# Patient Record
Sex: Female | Born: 1980 | Race: Black or African American | Hispanic: No | Marital: Single | Smoking: Never smoker
Health system: Southern US, Community
[De-identification: ages and names within clinical notes are randomized; demographics above are authoritative.]

## PROBLEM LIST (undated history)

## (undated) ENCOUNTER — Inpatient Hospital Stay (HOSPITAL_COMMUNITY): Payer: Self-pay

## (undated) ENCOUNTER — Ambulatory Visit (HOSPITAL_COMMUNITY): Admission: EM | Payer: 59 | Source: Home / Self Care

## (undated) DIAGNOSIS — N390 Urinary tract infection, site not specified: Secondary | ICD-10-CM

## (undated) DIAGNOSIS — R011 Cardiac murmur, unspecified: Secondary | ICD-10-CM

## (undated) DIAGNOSIS — R51 Headache: Secondary | ICD-10-CM

## (undated) DIAGNOSIS — I499 Cardiac arrhythmia, unspecified: Secondary | ICD-10-CM

---
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
## 2003-03-17 ENCOUNTER — Emergency Department (HOSPITAL_COMMUNITY): Admission: EM | Admit: 2003-03-17 | Discharge: 2003-03-17 | Payer: Self-pay | Admitting: Emergency Medicine

## 2003-03-17 ENCOUNTER — Encounter: Payer: Self-pay | Admitting: Emergency Medicine

## 2003-04-17 ENCOUNTER — Inpatient Hospital Stay (HOSPITAL_COMMUNITY): Admission: AD | Admit: 2003-04-17 | Discharge: 2003-04-17 | Payer: Self-pay | Admitting: *Deleted

## 2003-05-23 ENCOUNTER — Other Ambulatory Visit: Admission: RE | Admit: 2003-05-23 | Discharge: 2003-05-23 | Payer: Self-pay | Admitting: Obstetrics and Gynecology

## 2003-08-30 ENCOUNTER — Inpatient Hospital Stay (HOSPITAL_COMMUNITY): Admission: AD | Admit: 2003-08-30 | Discharge: 2003-08-31 | Payer: Self-pay | Admitting: Obstetrics and Gynecology

## 2003-09-03 ENCOUNTER — Inpatient Hospital Stay (HOSPITAL_COMMUNITY): Admission: AD | Admit: 2003-09-03 | Discharge: 2003-09-03 | Payer: Self-pay | Admitting: Otolaryngology

## 2003-09-05 ENCOUNTER — Encounter: Payer: Self-pay | Admitting: Obstetrics and Gynecology

## 2003-09-05 ENCOUNTER — Inpatient Hospital Stay (HOSPITAL_COMMUNITY): Admission: AD | Admit: 2003-09-05 | Discharge: 2003-09-05 | Payer: Self-pay | Admitting: Obstetrics and Gynecology

## 2003-09-06 ENCOUNTER — Inpatient Hospital Stay (HOSPITAL_COMMUNITY): Admission: AD | Admit: 2003-09-06 | Discharge: 2003-09-10 | Payer: Self-pay | Admitting: Obstetrics and Gynecology

## 2003-09-06 ENCOUNTER — Encounter (INDEPENDENT_AMBULATORY_CARE_PROVIDER_SITE_OTHER): Payer: Self-pay | Admitting: Specialist

## 2003-09-06 ENCOUNTER — Encounter: Payer: Self-pay | Admitting: Emergency Medicine

## 2003-09-13 ENCOUNTER — Inpatient Hospital Stay (HOSPITAL_COMMUNITY): Admission: AD | Admit: 2003-09-13 | Discharge: 2003-09-30 | Payer: Self-pay | Admitting: Obstetrics and Gynecology

## 2003-09-14 ENCOUNTER — Encounter: Payer: Self-pay | Admitting: Obstetrics and Gynecology

## 2003-09-15 ENCOUNTER — Encounter: Payer: Self-pay | Admitting: Obstetrics and Gynecology

## 2003-09-19 ENCOUNTER — Encounter: Payer: Self-pay | Admitting: Surgery

## 2003-10-11 ENCOUNTER — Inpatient Hospital Stay (HOSPITAL_COMMUNITY): Admission: AD | Admit: 2003-10-11 | Discharge: 2003-10-11 | Payer: Self-pay | Admitting: *Deleted

## 2004-01-04 ENCOUNTER — Emergency Department (HOSPITAL_COMMUNITY): Admission: EM | Admit: 2004-01-04 | Discharge: 2004-01-04 | Payer: Self-pay | Admitting: Emergency Medicine

## 2005-05-06 ENCOUNTER — Emergency Department (HOSPITAL_COMMUNITY): Admission: EM | Admit: 2005-05-06 | Discharge: 2005-05-06 | Payer: Self-pay | Admitting: Emergency Medicine

## 2005-08-16 ENCOUNTER — Emergency Department (HOSPITAL_COMMUNITY): Admission: EM | Admit: 2005-08-16 | Discharge: 2005-08-16 | Payer: Self-pay | Admitting: Emergency Medicine

## 2005-10-31 ENCOUNTER — Emergency Department (HOSPITAL_COMMUNITY): Admission: EM | Admit: 2005-10-31 | Discharge: 2005-10-31 | Payer: Self-pay | Admitting: Emergency Medicine

## 2005-11-06 ENCOUNTER — Ambulatory Visit: Payer: Self-pay | Admitting: Cardiology

## 2005-11-12 ENCOUNTER — Ambulatory Visit: Payer: Self-pay | Admitting: Cardiology

## 2005-12-05 ENCOUNTER — Ambulatory Visit: Payer: Self-pay | Admitting: Cardiology

## 2006-01-16 ENCOUNTER — Ambulatory Visit: Payer: Self-pay | Admitting: Cardiology

## 2006-03-01 ENCOUNTER — Emergency Department (HOSPITAL_COMMUNITY): Admission: EM | Admit: 2006-03-01 | Discharge: 2006-03-01 | Payer: Self-pay | Admitting: Emergency Medicine

## 2006-04-17 ENCOUNTER — Emergency Department (HOSPITAL_COMMUNITY): Admission: EM | Admit: 2006-04-17 | Discharge: 2006-04-17 | Payer: Self-pay | Admitting: Emergency Medicine

## 2006-07-21 ENCOUNTER — Ambulatory Visit: Payer: Self-pay | Admitting: Internal Medicine

## 2006-10-10 ENCOUNTER — Ambulatory Visit: Payer: Self-pay | Admitting: Cardiology

## 2006-10-23 ENCOUNTER — Encounter: Payer: Self-pay | Admitting: Cardiology

## 2006-10-23 ENCOUNTER — Ambulatory Visit: Payer: Self-pay

## 2006-10-27 ENCOUNTER — Ambulatory Visit: Payer: Self-pay | Admitting: Cardiology

## 2006-10-30 ENCOUNTER — Emergency Department (HOSPITAL_COMMUNITY): Admission: EM | Admit: 2006-10-30 | Discharge: 2006-10-31 | Payer: Self-pay | Admitting: Emergency Medicine

## 2007-03-12 ENCOUNTER — Emergency Department (HOSPITAL_COMMUNITY): Admission: EM | Admit: 2007-03-12 | Discharge: 2007-03-12 | Payer: Self-pay | Admitting: Emergency Medicine

## 2007-04-26 ENCOUNTER — Inpatient Hospital Stay (HOSPITAL_COMMUNITY): Admission: AD | Admit: 2007-04-26 | Discharge: 2007-04-26 | Payer: Self-pay | Admitting: Obstetrics

## 2007-06-15 ENCOUNTER — Ambulatory Visit (HOSPITAL_COMMUNITY): Admission: RE | Admit: 2007-06-15 | Discharge: 2007-06-15 | Payer: Self-pay | Admitting: Obstetrics

## 2007-07-20 ENCOUNTER — Ambulatory Visit (HOSPITAL_COMMUNITY): Admission: RE | Admit: 2007-07-20 | Discharge: 2007-07-20 | Payer: Self-pay | Admitting: Obstetrics

## 2007-09-15 ENCOUNTER — Inpatient Hospital Stay (HOSPITAL_COMMUNITY): Admission: AD | Admit: 2007-09-15 | Discharge: 2007-09-15 | Payer: Self-pay | Admitting: Obstetrics & Gynecology

## 2007-09-28 ENCOUNTER — Inpatient Hospital Stay (HOSPITAL_COMMUNITY): Admission: AD | Admit: 2007-09-28 | Discharge: 2007-09-28 | Payer: Self-pay | Admitting: Obstetrics & Gynecology

## 2007-10-03 ENCOUNTER — Inpatient Hospital Stay (HOSPITAL_COMMUNITY): Admission: AD | Admit: 2007-10-03 | Discharge: 2007-10-03 | Payer: Self-pay | Admitting: Obstetrics & Gynecology

## 2007-11-08 ENCOUNTER — Inpatient Hospital Stay (HOSPITAL_COMMUNITY): Admission: AD | Admit: 2007-11-08 | Discharge: 2007-11-08 | Payer: Self-pay | Admitting: Obstetrics

## 2007-11-08 ENCOUNTER — Inpatient Hospital Stay (HOSPITAL_COMMUNITY): Admission: AD | Admit: 2007-11-08 | Discharge: 2007-11-11 | Payer: Self-pay | Admitting: Obstetrics

## 2007-11-09 ENCOUNTER — Encounter: Payer: Self-pay | Admitting: Obstetrics

## 2008-04-14 ENCOUNTER — Emergency Department (HOSPITAL_COMMUNITY): Admission: EM | Admit: 2008-04-14 | Discharge: 2008-04-14 | Payer: Self-pay | Admitting: Emergency Medicine

## 2008-05-14 ENCOUNTER — Emergency Department (HOSPITAL_COMMUNITY): Admission: EM | Admit: 2008-05-14 | Discharge: 2008-05-14 | Payer: Self-pay | Admitting: Emergency Medicine

## 2008-07-29 ENCOUNTER — Emergency Department (HOSPITAL_COMMUNITY): Admission: EM | Admit: 2008-07-29 | Discharge: 2008-07-29 | Payer: Self-pay | Admitting: Emergency Medicine

## 2009-06-08 ENCOUNTER — Emergency Department (HOSPITAL_COMMUNITY): Admission: EM | Admit: 2009-06-08 | Discharge: 2009-06-08 | Payer: Self-pay | Admitting: Emergency Medicine

## 2010-03-10 ENCOUNTER — Emergency Department (HOSPITAL_COMMUNITY): Admission: EM | Admit: 2010-03-10 | Discharge: 2010-03-10 | Payer: Self-pay | Admitting: Emergency Medicine

## 2010-12-16 ENCOUNTER — Encounter: Payer: Self-pay | Admitting: Obstetrics

## 2011-01-11 ENCOUNTER — Inpatient Hospital Stay (INDEPENDENT_AMBULATORY_CARE_PROVIDER_SITE_OTHER)
Admission: RE | Admit: 2011-01-11 | Discharge: 2011-01-11 | Disposition: A | Discharge status: Home / Self Care | Payer: Self-pay | Source: Ambulatory Visit | Attending: Family Medicine | Admitting: Family Medicine

## 2011-01-11 DIAGNOSIS — K5289 Other specified noninfective gastroenteritis and colitis: Secondary | ICD-10-CM

## 2011-01-11 DIAGNOSIS — R110 Nausea: Secondary | ICD-10-CM

## 2011-02-12 LAB — CBC
HCT: 41.1 % (ref 36.0–46.0)
Platelets: 249 10*3/uL (ref 150–400)
RDW: 13.4 % (ref 11.5–15.5)

## 2011-02-12 LAB — RAPID URINE DRUG SCREEN, HOSP PERFORMED
Benzodiazepines: NOT DETECTED
Cocaine: NOT DETECTED
Tetrahydrocannabinol: NOT DETECTED

## 2011-02-12 LAB — COMPREHENSIVE METABOLIC PANEL
AST: 29 U/L (ref 0–37)
Albumin: 4.4 g/dL (ref 3.5–5.2)
Alkaline Phosphatase: 39 U/L (ref 39–117)
BUN: 7 mg/dL (ref 6–23)
GFR calc Af Amer: >60 mL/min (ref >60)
Potassium: 2.9 mEq/L — ABNORMAL LOW (ref 3.5–5.1)
Total Protein: 7.8 g/dL (ref 6.0–8.3)

## 2011-02-12 LAB — DIFFERENTIAL
Lymphocytes Relative: 11 % — ABNORMAL LOW (ref 12–46)
Monocytes Absolute: 0.9 10*3/uL (ref 0.1–1.0)
Monocytes Relative: 7 % (ref 3–12)
Neutro Abs: 10.8 10*3/uL — ABNORMAL HIGH (ref 1.7–7.7)

## 2011-02-12 LAB — ETHANOL: Alcohol, Ethyl (B): <5 mg/dL (ref 0–10)

## 2011-03-10 ENCOUNTER — Inpatient Hospital Stay (HOSPITAL_COMMUNITY)
Admission: AD | Admit: 2011-03-10 | Discharge: 2011-03-11 | Disposition: A | Discharge status: Home / Self Care | Payer: Medicaid Other | Source: Ambulatory Visit | Attending: Obstetrics & Gynecology | Admitting: Obstetrics & Gynecology

## 2011-03-10 DIAGNOSIS — R071 Chest pain on breathing: Secondary | ICD-10-CM | POA: Insufficient documentation

## 2011-03-10 DIAGNOSIS — B9789 Other viral agents as the cause of diseases classified elsewhere: Secondary | ICD-10-CM

## 2011-03-10 DIAGNOSIS — R509 Fever, unspecified: Secondary | ICD-10-CM

## 2011-03-10 LAB — CBC
Hemoglobin: 13.7 g/dL (ref 12.0–15.0)
MCH: 28.8 pg (ref 26.0–34.0)
MCV: 86.1 fL (ref 78.0–100.0)
RBC: 4.75 MIL/uL (ref 3.87–5.11)

## 2011-03-10 LAB — URINALYSIS, ROUTINE W REFLEX MICROSCOPIC
Ketones, ur: NEGATIVE mg/dL
Nitrite: NEGATIVE
Protein, ur: 30 mg/dL — AB
Urobilinogen, UA: 1 mg/dL (ref 0.0–1.0)

## 2011-03-10 LAB — COMPREHENSIVE METABOLIC PANEL
AST: 19 U/L (ref 0–37)
BUN: 4 mg/dL — ABNORMAL LOW (ref 6–23)
CO2: 25 mEq/L (ref 19–32)
Chloride: 105 mEq/L (ref 96–112)
Creatinine, Ser: 0.75 mg/dL (ref 0.4–1.2)
GFR calc Af Amer: >60 mL/min (ref >60)
GFR calc non Af Amer: >60 mL/min (ref >60)
Total Bilirubin: 0.7 mg/dL (ref 0.3–1.2)

## 2011-03-10 LAB — DIFFERENTIAL
Basophils Absolute: 0 10*3/uL (ref 0.0–0.1)
Basophils Relative: 0 % (ref 0–1)
Eosinophils Relative: 0 % (ref 0–5)
Lymphocytes Relative: 14 % (ref 12–46)

## 2011-03-10 LAB — RAPID STREP SCREEN (MED CTR MEBANE ONLY): Streptococcus, Group A Screen (Direct): NEGATIVE

## 2011-04-12 NOTE — Op Note (Signed)
Ann Taylor, Ann Taylor                       ACCOUNT NO.:  [id number]   MEDICAL RECORD NO.:  [id number]                   PATIENT TYPE:  INP   LOCATION:  0160                                 FACILITY:  [facility]   PHYSICIAN:  Tanya S. Shawnie Pons, M.D.                DATE OF BIRTH:  December 26, 1980   DATE OF PROCEDURE:  09/23/2003  DATE OF DISCHARGE:                                 OPERATIVE REPORT   PREOPERATIVE DIAGNOSIS:  Pelvic abscess.   POSTOPERATIVE DIAGNOSIS:  Pelvic abscess.   PROCEDURE:  Exploratory laparotomy with drainage of abscess, lysis of  adhesions.   SURGEON:  Lorne Skeens. Hoxworth, M.D. and Shelbie Proctor. Shawnie Pons, M.D.   ANESTHESIA:  General.   FINDINGS:  Pelvic abscess, very edematous broad ligament and tube on the  right side, normal appearing ovary, multiple adhesions of the uterus and  bowel to the pelvic organs and anterior abdominal wall.   ESTIMATED BLOOD LOSS:  200 mL.   COMPLICATIONS:  None.   SPECIMENS:  Cultures to pathology.   INDICATIONS FOR PROCEDURE:  The patient is a 30 year old, G1, P1 who is  postop from a cesarean section approximately 18 days ago who had been  readmitted with a pelvic abscess. She was on IV antibiotics for many days  with continued high spiking temperatures and failure of resolution of her  symptoms. She had several CT's that all showed a fluid collection of about 7  cm in the right pelvis and since it did not respond to IV antibiotics, she  was taken to surgery.   DESCRIPTION OF PROCEDURE:  The patient was taken to the OR where was prepped  and draped in the usual sterile fashion. A Pfannenstiel incision was made  through the previous scar with a knife, carried down to the underlying  fascia which was divided with the electrocautery. The rectus muscle was then  divided in the midline and the peritoneal cavity entered. Upon entry,  omentum was noted to be stuck to the superior most portion of the uterus as  well as the area where the  previous incision was on the uterus was attached  to the abdominal wall as well. In the right pelvis, a mass was felt and with  manipulation pus was noted, cultures were taken, more pus noted. Following  this, the cecum, appendix and bowel were looked at and felt to be free from  the areas of induration. An 18 gauge needle and syringe were used to try to  aspirate any pus from the remaining pockets and none was found. After  removal of omentum from the tube on that side, it was felt that the round  could be positively identified on the right, the ovary was seen posteriorly  and the tube and broad ligament were exceptionally edematous and attached to  the right pelvic sidewall. There were several exceptionally indurated areas  which were incised with the electrocautery but  no pus was found in these  areas. However, the tube was entered and looked to be normal in appearance.  This area was then copiously irrigated, a drain placed in the area and the  rectus muscle which had been previously incised with the electrocautery on  the right was put back together with a 0 PDS in a running fashion. The two  sides of the  rectus muscle were then put back together with #1 PDS. The two sides of the  fascia were closed from either end with a 0 PDS and tied in the midline. The  subcutaneous tissue was then packed with 4 x 4's. All instrument, needle and  lap counts were correct x2. The patient was awakened and taken to recovery  in stable condition.                                               Shelbie Proctor. Shawnie Pons, M.D.    TSP/MEDQ  D:  09/23/2003  T:  09/23/2003  Job:  [id number]

## 2011-04-12 NOTE — H&P (Signed)
NAME:  Ann Taylor, Ann Taylor                       ACCOUNT NO.:  [id number]   MEDICAL RECORD NO.:  [id number]                   PATIENT TYPE:  INP   LOCATION:  9327                                 FACILITY:  [facility]   PHYSICIAN:  James A. Ashley Royalty, M.D.             DATE OF BIRTH:  Apr 23, 1981   DATE OF ADMISSION:  09/13/2003  DATE OF DISCHARGE:                                HISTORY & PHYSICAL   HISTORY OF PRESENT ILLNESS:  This is a 30 year old gravida 1, para 1, who  had a C-section September 06, 2003, for late decelerations in labor.  At the  time of the cesarean section she was noted to have meconium-stained amniotic  fluid and also foul-smelling amniotic fluid, suggestive of chorioamnionitis.  The intraoperative course was unremarkable.  Postoperatively the patient was  treated therapeutically with IV antibiotics and rapidly became afebrile.  Her baby initially went to the central nursery but was later sent to the  neonatal intensive care unit.  The patient was allowed to stay in the  hospital for four days and was discharged home on the fourth postoperative  day, afebrile, and in satisfactory condition.   The patient presented to the office this morning after she was documented at  her house having a temperature as high as 100.8 degrees Fahrenheit.  She  also complained of some serous drainage from her incision.  She stated she  had some rather modest discomfort on the right side of her abdomen and  radiating somewhat to her right leg and right buttocks.  She denied any  other GI, GU, or GYN symptoms.   MEDICATIONS:  Percocet for incisional discomfort.   PAST MEDICAL HISTORY:  The patient alleged during her prenatal care that she  was positive for sickle trait.  However, a hemoglobin electrophoresis  documented she had adult hemoglobin.  She was group B Strep positive.   PAST SURGICAL HISTORY:  C-section only as above.   ALLERGIES:  None.   FAMILY HISTORY:  Positive for  hypertension and diabetes.   SOCIAL HISTORY:  The patient denies use of tobacco or alcohol.   REVIEW OF SYSTEMS:  Noncontributory.   PHYSICAL EXAMINATION:  GENERAL:  Well-developed, well-nourished, pleasant  black female in no acute distress.  VITAL SIGNS:  Temperature 100.4 degrees Fahrenheit, vital signs stable.  SKIN:  Warm and dry without lesions.  LYMPHS:  There is no supraclavicular, cervical, or inguinal adenopathy.  HEENT:  Normocephalic.  NECK:  Supple without thyromegaly.  CHEST:  Lungs are clear.  CARDIAC:  Regular rate and rhythm without murmurs, gallops, or rubs.  BREASTS:  Reveal no palpable masses, discharge, retraction, erythema, or  adenopathy.  ABDOMEN:  Soft and nontender with the fundus being at approximately two  finger breadths below the umbilicus.  I could appreciate no significant  abdominal tenderness and certainly no rebound.  Bowel sounds are active.  Careful inspection of the incision revealed a small  amount of serous  drainage eminating from an area just to the left of midline as well as near  the extreme left aspect of the incision.  I could appreciate no purulent  drainage whatsoever.  There was some induration noted.  I could appreciate  no erythema.  MUSCULOSKELETAL:  Revealed full range of motion without edema, cyanosis, or  CVA tenderness.  PELVIC:  Revealed no obvious pelvic mass on bimanual.  There was appropriate  incisional tenderness.   IMPRESSION:  1. Status post C-section one week ago for late decelerations.  2. Probable chorioamnionitis - resolved with IV antibiotics.  3. Fever and serous incisional drainage with induration - possible early     wound infection.  Differential includes urinary tract infection,     endomyometritis.  Doubt sepsis.   PLAN:  Admit to [facility].  Will obtain labs, cultures including  wound, urine, blood.  Will further probe incision after admission and  initiate IV antibiotics.                                                   James A. Ashley Royalty, M.D.    JAM/MEDQ  D:  09/13/2003  T:  09/13/2003  Job:  [id number]

## 2011-04-12 NOTE — Discharge Summary (Signed)
NAME:  Ann, Taylor                       ACCOUNT NO.:  [id number]   MEDICAL RECORD NO.:  [id number]                   PATIENT TYPE:  INP   LOCATION:  0471                                 FACILITY:  [facility]   PHYSICIAN:  Sharlet Salina T. Hoxworth, M.D.          DATE OF BIRTH:  03-28-1981   DATE OF ADMISSION:  09/13/2003  DATE OF DISCHARGE:  09/30/2003                                 DISCHARGE SUMMARY   DISCHARGE DIAGNOSIS:  Pelvic abscess, status post cesarean section.   OPERATIONS AND PROCEDURES:  1. Percutaneous aspiration of percutaneous abscess, CT guided on September 15, 2003.  2. Laparotomy with drainage of pelvic abscess by Dr. Johna Sheriff and Dr. Shawnie Pons     on September 23, 2003.   CONSULTATIONS:  Dr. Mancel Bale.   HISTORY OF PRESENT ILLNESS:  This patient is a 30 year old gravida 1, para  1, black female status post cesarean section on September 06, 2003, by Dr.  Ashley Royalty for late decelerations in labor.  At that time, she had findings  suggestive of chorioamnionitis.  The patient was treated postoperatively  with IV antibiotics, rapidly became afebrile.  She was discharged after the  fourth postoperative day, afebrile, in good condition.  The patient  presented on the morning of admission with fever as high as 100.8 and slight  drainage from her incision, as well as discomfort in the right side of the  abdomen radiating to her back.  She was admitted for further evaluation and  treatment by Dr. Ashley Royalty at [facility].   MEDICATIONS ON ADMISSION:  Percocet for pain.   PAST MEDICAL HISTORY:  Unremarkable except as above.   ALLERGIES:  No known drug allergies.   FAMILY HISTORY:  See detailed H&P.   REVIEW OF SYSTEMS:  See detailed H&P.   PHYSICAL EXAMINATION:  VITAL SIGNS:  Temperature is 100.4, vital signs all  within normal limits.  ABDOMEN:  Revealed an appropriately enlarged fundus, small amount of serous  drainage from the Pfannenstiel incision just left of  the midline, and no  significant abdominal tenderness noted.   HOSPITAL COURSE:  The patient was admitted to [facility] by Dr.  Ashley Royalty and was started on broad spectrum antibiotics.  Soon after  admission, her temperature was increased to 102.8.  Laboratory evaluations  revealed a white count of 19,700, with a hemoglobin of 9.3, urinalysis was  clear.  Due to fever and white count, a CT scan of the abdomen and pelvis  was obtained which revealed a complex apparent abscess in the right pelvis.  This was consistent with an infected pelvic hematoma.  Her wound was opened  slightly by Dr. Ashley Royalty with just serous drainage and no purulence.  This  was treated with dry gauze dressing changes.  She was continued on IV  antibiotics.  She was seen in consultation by infectious disease service.  She had been on Unasyn and her antibiotics were changed  to Zosyn up to  vancomycin.  She underwent aspiration of the area in radiology.  This was  felt to be multiloculated and a small amount of fluid was obtained, but a  catheter was not placed due to the multi-loculations.  She continued to have  fever of about 102 on September 15, 2003, and white count was still elevated  at 18,600.  The patient was seen in consultation by general surgery,  initially Dr. Jamey Ripa, who recommended continued antibiotics, close  observation, and re-CT scan to assess for progression or resolution and  possible percutaneous drainage.  By September 17, 2003, her temperature was  down to 99, and she had somewhat less pain and tenderness.  She was  continued on antibiotics.  Abscess culture revealed Strep viridans.  This  was sensitive to Zosyn, and her vancomycin discontinued by infectious  disease.  On September 19, 2003, her white count was down to 11,000.  She  still had some intermittent fever, but her temperature curve was steadily  down.  By September 20, 2003, she had a temperature max of 99, normal white  count.  Her  wounds healed without any evidence of further infection.  She  continued slow improvement, and plan was made to repeat her CT scan in  several days.  She did have one fever spike to 104 degrees on September 22, 2003, and rapidly back to normal, with still a normal white count of 7.9.  She requested transfer to the GYN Teaching Service at [facility] which  was accomplished.  However, on September 22, 2003, she again had a fever and  her CT scan was obtained at this point which showed a persistence of a  multiloculated apparent abscess in the right pelvis.  At this point, she was  also noted to be somewhat tachycardic with pulse of about 115, and blood  pressure down to 97/50.  She was given bolused IV fluids.  Temp later that  day was increased to 105, with a pulse of 132, and she was transferred to  the intensive care unit at [facility].  Due to some deterioration  and inability to drain the abscess percutaneously, a laparotomy and drainage  was recommended and accepted.  She was continued on Zosyn.  Laboratory at  this time also revealed a moderately elevated PTT of 55 seconds, with a PT  of 15.9, and INR of 1.4.  Preoperatively, she received 2 units of fresh  frozen plasma and was taken to the operating room on September 23, 2003.  Findings were a discrete abscess adjacent to the right tube, and uterus and  cecum, but not clearly emanating from any of these structures.  She  underwent drainage of her abscess and a thorough exploration.  Wound was  left packed open.  She continued on antibiotics postoperatively which were  changed to Cipro plus clindamycin due to complaints of severe itching after  her most recent dose of Zosyn.  She was febrile to 103 on the first  postoperative day, down to 100 on the second postoperative day.  She had a  fair amount of incisional pain treated with PCA morphine.  White count was 7.8 on September 25, 2003.  She was begun on a full liquid diet.   She still  had some intermittent fever for the next several days, but this trended down  and was afebrile by September 27, 2003, and remained so.  She was transferred  to the floor.  No  definite diagnosis was found for elevated PTT, but it was  felt it may be some combination of antibiotics and infection.  Lupus  anticoagulant panel was negative.  She also had Lovenox and this was  discontinued perioperatively.  She continued to steadily improve.  Advanced  to a regular diet.  Activity level increased.  IV antibiotics were  discontinued.  She was changed to p.o. Cipro plus clindamycin to be  continued for nine days after discharge.  She was discharged home on  September 30, 2003.  She has minimal abdominal discomfort.  Wound has nearly  healed with a small area of granulation tissue.  Abdomen is nontender.  She  was also given Vicodin p.r.n. for pain.  Follow up with me in my office in  two weeks.                                               Lorne Skeens. Hoxworth, M.D.    Tory Emerald  D:  10/10/2003  T:  10/10/2003  Job:  [id number]   cc:   Jillyn Hidden B. Truett Perna, M.D.  [address] [facility]  [place]  [place] [address]  Fax: [phone]   Shelbie Proctor. Shawnie Pons, M.D.  Fax: [phone]   Rudy Jew. Ashley Royalty, M.D.  [address]  [place], [place] [address]  Fax: [phone]   Rockey Situ. Flavia Shipper., M.D.  [address]  [place]  [place] [address]  Fax: [phone]

## 2011-04-12 NOTE — H&P (Signed)
   NAME:  Ann Taylor, Ann Taylor                       ACCOUNT NO.:  [id number]   MEDICAL RECORD NO.:  [id number]                   PATIENT TYPE:  INP   LOCATION:  9327                                 FACILITY:  [facility]   PHYSICIAN:  James A. Ashley Royalty, M.D.             DATE OF BIRTH:  04-Jan-1981   DATE OF ADMISSION:  09/13/2003  DATE OF DISCHARGE:                                HISTORY & PHYSICAL   HISTORY OF PRESENT ILLNESS:  The patient is a 30 year old, gravida 1, para  1, seven days post primary low-transverse cesarean section for late  decelerations in labor.  At the time of the C-section, the patient was noted  to also have meconium staining amniotic fluid and foul-smelling fluid  consistent with chorioamnionitis.  Her initial postoperative course at  [facility] was complicated by fever.  She was treated therapeutically  without the antibiotics and defervesced rapidly.  Her infant started out in  the central nursery and then later was moved to the Neonatal Intensive Care  Unit.  She was discharged on the fourth postoperative day. Afebrile and in  satisfactory condition.  At the time of the discharge, her incision was dry,  intact, nonerythematous, and nonindurated.   The patient returned to the office today saying she was having some leakage  from her incision.  It seemed to be serous in nature.  She had been  documented as having a temperature of 100.8 degrees Fahrenheit at her house.  She stated she had some mild abdominal discomfort over the operative area,  particularly on the right side, radiating slightly to her leg and also to  her right hip.  She stated her lochia was minimal and she denied any other  specific symptoms.   DISCHARGE MEDICATIONS:  Percocet.   PAST MEDICAL HISTORY:  Genetic:  The patient alleged she was positive for  sickle trait during pregnancy but a hemoglobin electrophoresis confirmed she  did not have sickle trait or sickle cell.  She did register group  B  Streptococcus positive.   Dictation was cut off at this point.                                               James A. Ashley Royalty, M.D.    JAM/MEDQ  D:  09/13/2003  T:  09/13/2003  Job:  [id number]

## 2011-04-12 NOTE — Op Note (Signed)
NAME:  Ann Taylor                       ACCOUNT NO.:  [id number]   MEDICAL RECORD NO.:  [id number]                   PATIENT TYPE:  INP   LOCATION:  0160                                 FACILITY:  [facility]   PHYSICIAN:  Sharlet Salina T. Hoxworth, M.D.          DATE OF BIRTH:  Jan 01, 1981   DATE OF PROCEDURE:  09/23/2003  DATE OF DISCHARGE:                                 OPERATIVE REPORT   PREOPERATIVE DIAGNOSIS:  Pelvic abscess.   POSTOPERATIVE DIAGNOSIS:  Pelvic abscess.   OPERATION/PROCEDURE:  Laparotomy and drainage of pelvic abscess.   SURGEON:  Lorne Skeens. Hoxworth, M.D.  Shelbie Proctor. Shawnie Pons, M.D.   ANESTHESIA:  General.   BRIEF HISTORY:  Ann Taylor is a 30 year old white female just over two  weeks following cesarean section.  She developed fever and right lower  quadrant pain postoperatively.  On CT scan has had a complex fluid  collection consistent with abscess in the right lower quadrant just below  the cecum and lateral to the uterus.  Initially this was treated with IV  antibiotics with initial improvement but she has developed recurrent spiking  fevers the past 48 hours.  Repeat CT scan shows persistent collection.  This  is not felt to be drainable with CT-guided catheter due to multiloculation.  For this reason, a laparotomy and drainage of the abscess has been  recommended.  The procedure was discussed extensively with the patient and  her mother preoperatively including the indications, alternatives, risks of  bleeding, infection, intestinal injury, recurrent abscess, and slight  possibility for requiring salpingo-oophorectomy and hysterectomy.  The  procedure is to be done in conjunction with Dr. Shawnie Pons from the OB/GYN  service.   DESCRIPTION OF PROCEDURE:  The patient was brought to the operating room and  placed in the supine position on the operating table and general  endotracheal anesthesia was induced.  She was already on broad-spectrum  antibiotics.   The abdomen was widely sterilely prepped and draped.  Foley  catheter was in place.  PS were in place.   The previous Pfannenstiel incision was used and this was sharply opened and  dissection carried down with cautery to the anterior fascia which was  incised transversely.  The incision was extended 1 cm to out toward the  right upper quadrant.  Dissection was carried down to the rectus muscle.  There was a lot of inflammation in the wound but no obvious pus.  The  anterior fascia was dissected up off the rectus muscle superiorly for 4-5  cm.  The rectus muscle was then divided in the midline as had been done  previously and using careful blunt dissection under direct vision, the  peritoneal cavity entered just above the uterus.  The uterus was obviously  still enlarged and adherent up to the anterior abdominal wall in the area of  the incision.  The free peritoneal cavity was entered above this and there  was  no free fluid or abnormalities here with normal-appearing free bowel.  Using careful blunt dissection, the uterus was dissected off the anterior  abdominal wall towards the right pelvic sidewall.  As the dissection  progressed laterally, a pocket of purulent material was entered.  This was  cultured and completely drained. This pocket lay just below the cecum and  just lateral to the right tube and ovary and the uterus.  Following this  drainage, area was much more extensively explored.  The terminal ileum,  cecum and appendix were identified and appeared to be above the process.  There was quite a lot of inflammatory change and everything was quite stuck  here.  Using careful blunt dissection, the tube and ovary were dissected  free.  Dr. Shawnie Pons carefully inspected the tube, ovary and uterus and this is  to be dictated separately.  There was one area just lateral to the tube that  appeared to be part of the broad ligament that was felt possibly to contain  fluid and pus and this was  aspirated and we actually got the GI content here  and after further dissection, this was an area of right colon just above the  cecum but adherent down to the pelvic sidewall.  This has been aspirated  with a 10-gauge needle and there was no leakage at all from this area.  After aspiration, I did, however, reinforce this with one figure-of-eight  suture and 3-0 silk.  After a thorough dissection of the right tube, ovary,  cecum, terminal ileum, and uterus, we felt assured that there was no  remaining collection drained.  A 19 Blake closed suction drain was left in  the abscess cavity and brought out through a separate stab wound in the  right lower quadrant.  The operative site was thoroughly irrigated with  saline and hemostasis assured.  The right rectus muscle had been split  somewhat to allow better exposure and this was closed with running #1 PDS.  The midline rectus muscle peritoneum was then closed with running 0 PDS.  The anterior fascia was closed transversely with running 0 PDS.  Subcutaneous tissue was irrigated and skin and subcutaneous tissue packed  open with moist saline gauze.  Sponge, needle and instrument counts were  correct.  Dry sterile dressings were applied and the patient was taken to  recovery in satisfactory condition.                                                 Lorne Skeens. Hoxworth, M.D.    Tory Emerald  D:  09/23/2003  T:  09/23/2003  Job:  [id number]
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
## 2011-04-12 NOTE — Assessment & Plan Note (Signed)
[facility]                            CARDIOLOGY OFFICE NOTE   ZAYNAB, CHIPMAN                      MRN:          [id number]  DATE:10/27/2006                            DOB:          November 07, 1981    PRIMARY CARE PHYSICIAN:  None.   REASON FOR VISIT:  Followup cardiac testing.   HISTORY OF PRESENT ILLNESS:  I saw Ms. Huser back in mid-November.  Her history is detailed in my previous note.  I referred her for  followup cardiac testing, given recurrent symptoms.  She underwent an  exercise echocardiogram which was normal, showing no evidence of  ischemia or exercise-induced arrhythmias.  Her resting echocardiogram  showed a normal left ventricular ejection fraction of 55-60%, with only  trivial mitral regurgitation and no pericardial effusion as well as  normal right ventricular chamber size.  I reviewed these results with  the patient today and reassured her.  I also went over her symptoms  again, and she really seems to describe more palpitations than anything  else, with a sudden feeling of breathlessness when she experiences the  palpitations.  She never did try the Toprol-XL that I had originally  recommended, and I have recommended this again today and provided her a  prescription.  Records from her prior [facility] evaluation as an  infant are pending.  The patient's mother is not present today.   ALLERGIES:  NO KNOWN DRUG ALLERGIES.   CURRENT MEDICATIONS:  1. Depo-Provera every 3 months.  2. Zyrtec 10 mg p.o. daily.  3. Clarinex 5 mg p.o. daily.  4. Benadryl p.r.n.   REVIEW OF SYSTEMS:  As described in the history of present illness.  She  has not had any syncope.   PHYSICAL EXAMINATION:  VITAL SIGNS:  Blood pressure 102/60, heart rate  84.  Weight 148 pounds.  GENERAL:  The patient is comfortable and in no acute distress.  NECK:  No elevated jugular venous pressure or bruits.  LUNGS:  Clear with unlabored breathing.  CARDIAC:  Regular rate and rhythm without rubs, murmurs, or gallops.  EXTREMITIES:  No pitting edema.   IMPRESSION AND RECOMMENDATIONS:  History of palpitations with brief  breathlessness and occasionally chest discomfort.  Recent evaluation was  reassuring, including an exercise echocardiogram and a resting  echocardiogram.  The likelihood of ischemic heart disease is quite low  based on this, and no obvious congenital abnormalities were noted on her  resting images.  I await records from [facility].  In the meanwhile,  I have recommended again that she try to use a beta-blocker to see if  this makes any improvement in symptoms.  She did have some possible  brief supraventricular tachycardia originally documented on her cardiac  monitoring.  I will plan to see her back over the next month.     Jonelle Sidle, MD  Electronically Signed    SGM/MedQ  DD: 10/27/2006  DT: 10/28/2006  Job #: [id number]

## 2011-04-12 NOTE — Assessment & Plan Note (Signed)
[facility]                              CARDIOLOGY OFFICE NOTE   Ann, Taylor                      MRN:          [id number]  DATE:10/10/2006                            DOB:          01-09-1981    PRIMARY CARE PHYSICIAN:  None.   REASON FOR VISIT:  Chest pain.   HISTORY OF PRESENT ILLNESS:  I saw Ann Taylor back in February.  Her  history is outlined in previous chart notes including intermittent  palpitations and shortness of breath.  Event monitoring has raised the  possibility of paroxysmal supraventricular tachycardia, although this is not  certain.  She is here with her mother today, apparently has had symptoms  more of chest tightness and dyspnea rather than frank palpitations recently.  Her mother provides additional history, stating that Ann Taylor was born  premature and was at [facility] for approximately 6 months as an infant.  She apparently had some type of heart problem at that time, although  details are very incomplete.  My understanding is that she required no  specific surgical/invasive cardiac procedures and has not had any regular  followup as best I can ascertain.   Her electrocardiogram today shows sinus rhythm with sinus arrhythmia and a  borderline short PR interval of 152 msec.  She has not used Toprol XL since  our visit in the past.  Previously documented TSH was normal as was a  standard chest x-ray.  She has had no syncope, cough, or hemoptysis.   ALLERGIES:  No known drug allergies.   PRESENT MEDICATIONS:  1. Depo-Provera every 3 months.  2. Zyrtec 10 mg p.o. daily.  3. Clarinex 5 mg p.o. daily.   REVIEW OF SYSTEMS:  As in History of Present Illness, otherwise negative.   PHYSICAL EXAMINATION:  VITAL SIGNS: Blood pressure 116/88, heart rate 78.  She weighs 143 pounds.  GENERAL:  The patient is comfortable and in no acute distress, breathing  normally.  HEENT:  Conjunctivae normal, oropharynx  clear.  NECK: Supple without obvious jugular venous pressure.  LUNGS: Clear without labored breathing.  CARDIAC: Regular rate and rhythm.  There is a soft basal systolic murmur  without radiation.  No obvious diastolic murmur or click.  No S3 gallop or  pericardial rub.  EXTREMITIES:  Exhibit no significant clubbing, cyanosis, or edema.  Distal  pulses are 2+.  SKIN: No ulcerative changes noted.  MUSCULOSKELETAL: No kyphosis.  NEUROPSYCHIATRIC:  The patient is alert and oriented x3.  Affect is normal.   IMPRESSION AND RECOMMENDATIONS:  1. Recurrent symptom complex including palpitations in the past with      intermittent dyspnea and now chest discomfort.  Palpitations have been      less of an issue recently, however.  There is the possibility of      paroxysmal supraventricular tachycardia based on limited event recorder      strips, although this is not entirely certain.  There is a question of      some type of heart problems in infancy with very limited details.  I  have asked the patient's mother to obtain the information regarding Ms.      Taylor's reported cardiac condition as a child.  We will also proceed      with a resting echocardiogram to assess for structural cardiac      abnormalities as well as an exercise echocardiogram to exclude the      possibility of ischemia or exercise-induced dysrhythmia.  I will then      have her follow up in the office to discuss the situation further.  2. Further plans to follow.     Jonelle Sidle, MD  Electronically Signed    SGM/MedQ  DD: 10/10/2006  DT: 10/10/2006  Job #: [id number]

## 2011-04-12 NOTE — Consult Note (Signed)
Ann Taylor, Ann Taylor                       ACCOUNT NO.:  [id number]   MEDICAL RECORD NO.:  [id number]                   PATIENT TYPE:  INP   LOCATION:  0471                                 FACILITY:  [facility]   PHYSICIAN:  Ann Taylor, M.D.              DATE OF BIRTH:  08/03/1981   DATE OF CONSULTATION:  09/28/2003  DATE OF DISCHARGE:                                   CONSULTATION   REASON FOR CONSULTATION:  Elevated PT and PTT.   ATTENDING PHYSICIAN FOR CONSULTATION:  Ann Taylor, M.D.   HISTORY OF PRESENT ILLNESS:  Ann Taylor is a 30 year old woman who is  status post cesarean section on September 06, 2003.  She was discharged home  on September 10, 2003.  At home, she developed fever up to 104 degrees with  shaking chills.  She was readmitted to [facility] on September 13, 2003,  for a possible wound infection.  Admission lab work showed a hemoglobin of  8.6, white count 18.7, and a platelet count of 644,000.  Two sets of blood  cultures drawn upon admission remained negative.  Wound culture returned  positive for Staph.  An urine culture was very positive for Enterococcus.  She was initially started on Unasyn.  This was changed to Zosyn on September 14, 2003.  Pelvic CT on September 14, 2003, showed a complex fluid collection,  right lower quadrant, tracking into the inferior anatomic pelvic and midline  rectus sheath.  This was felt to represent a hematoma, likely superinfected.  On September 15, 2003, she underwent aspiration of right lower quadrant fluid  with culture returning positive for viridans Streptococcus.  Repeat CT scans  of the abdomen and pelvis on September 18, 2003, showed new mesenteric and  retroperitoneal adenopathy, little change in the complex fluid collection of  the right lower quadrant.  She was transferred from [facility] to  [place] on September 23, 2003, in anticipation of open drainage.  Hematology consultation was requested prior to  surgery for further  evaluation of her elevated PT and PTT.  On September 14, 2003, PT was 14.3  with INR 1.2;  PTT 51.  On September 23, 2003, PT was 15.9 with INR of 1.4,  and PTT 55.  Mixing studies correction of the PT and near correction of the  PTT.  No baseline coag's are available.   PAST MEDICAL HISTORY:  1. Status post cesarean section.  2. The patient reports that she has been told in the past that she has     sickle cell trait.   CURRENT MEDICATIONS:  1. Zosyn.  2. Ibuprofen.  3. Lovenox.  4. Ferrous sulfate.  5. Colace.  6. Percocet.  7. Zofran.   ALLERGIES:  IODINE.   FAMILY HISTORY:  Mother is 10 years old with a history of hypertension;  father is healthy;  brother with history of hypertension;  sister and  brother both healthy.  The patient denies any personal or family history of  a bleeding disorder.   SOCIAL HISTORY:  Ann Taylor lives in [place].  She is single.  She has  one healthy infant son.  She is unemployed.  She has no history of ETOH or  tobacco use.   REVIEW OF SYSTEMS:  The patient reports fever and shaking chills prior to  admission.  She denies any bleeding or bruising.  She has very faint  incisional pain.  She denies any shortness of breath or cough.  She denies  any rectal bleeding.  She denies any hematuria.  She has no history of  bleeding problems or easy bruising.   PHYSICAL EXAMINATION:  VITAL SIGNS:  Temperature 104.2, heart rate 117,  respirations 18, blood pressure 136/77.  GENERAL:  A pleasant African-American female in no acute distress.  HEENT:  Normocephalic, atraumatic.  Pupils equal, round, reactive to light.  Extraocular movements were intact.  Sclerae anicteric.  Oropharynx is clear.  LYMPH:  Shotty cervical and inguinal lymph nodes.  CHEST:  Lungs are clear anteriorly.  CARDIOVASCULAR:  Regular rate and rhythm.  ABDOMEN:  Soft;  tender over pelvic incision, right greater than left.  EXTREMITIES:  No cyanosis, clubbing,  or edema.  No ecchymoses.  NEUROLOGIC:  Alert and oriented.  Moves all extremities.   LABORATORY DATA:  Hemoglobin 9.4, white count 9.2, platelet count 988,000.  Sodium 135, potassium 4.2, BUN 8, creatinine 0.9, glucose 99, calcium 9.1.  PT 15.9, INR 1.4, PTT 55.  PT mixing study corrected;  PTT mixing study near  correction.  Labs on September 17, 2003:  Total bilirubin 0.2, alkaline  phosphatase 54, SGOT 19, SGPT less than 19, total protein 6.2, albumin 2.4,  calcium 8.8.  Labs on September 06, 2003:  Hemoglobin 11.8, white count 15.4,  platelet count 208,000.   Peripheral blood smear:  Increased platelets with clots.  Numerous bands.   IMPRESSION:  1. Pelvic abscess, status post cesarean section.  2. Sickle cell trait.  3. Anemia.  4. Thrombocytosis.  5. Fever secondary to #1.  6. Elevated prothrombin time and partial prothrombin time.  7. Hypoalbuminemia.  8. Shotty adenopathy.   We are asked to see Ann Taylor to evaluate the elevated PT and elevated PTT  prior to planned surgical drainage of the pelvic abscess.  We do not have a  baseline PT/PTT.  The PT has been normal at times over the past week.  Dr.  Truett Taylor suspects a coagulopathy as related to polypharmacy (antibiotics)  and malnutrition affecting clotting factor synthesis.  The differential  diagnoses includes a Lovenox effect, DIC, and a lupus anticoagulant.  He has  a low suspicion for an inherited bleeding condition.  She did not have  reported excessive bleeding following the cesarean section.  She has no  previous history of bleeding.  We have a low suspicion for Willebrand's  disease.  Ann Taylor discussed the case with Dr. Johna Taylor.  He feels the  pelvic collection needs to be drained tonight.  We will not be able to  correct the coagulopathy prior to surgery with vitamin K.  The anemia is  likely secondary to polyphlebotomy, surgery, and questionable iron-  deficiency.  The thrombocytosis is  reactive.  RECOMMENDATIONS:  1. Vitamin K 10 mg subcutaneously every 12 hours for three doses.  2. Discontinue Lovenox.  3. Fresh frozen plasma prior to/during surgery as needed per Dr. Johna Taylor.  4. Lupus anticoagulant panel.  We will continue to follow while here.  Ms. Grandpre and her mother informed  of the potential increase risk of bleeding with surgery despite vitamin  K/fresh frozen plasma.   The patient seen and examined by Ann Taylor;  chart, labs, and peripheral  blood smear reviewed.     Lonna Cobb, N.P.                         Ann Taylor, M.D.    LT/MEDQ  D:  09/28/2003  T:  09/28/2003  Job:  [id number]
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
## 2011-04-28 ENCOUNTER — Emergency Department (HOSPITAL_COMMUNITY)
Admission: EM | Admit: 2011-04-28 | Discharge: 2011-04-28 | Disposition: A | Discharge status: Home / Self Care | Payer: Medicaid Other | Attending: Emergency Medicine | Admitting: Emergency Medicine

## 2011-04-28 DIAGNOSIS — H53149 Visual discomfort, unspecified: Secondary | ICD-10-CM | POA: Insufficient documentation

## 2011-04-28 DIAGNOSIS — R51 Headache: Secondary | ICD-10-CM | POA: Insufficient documentation

## 2011-05-07 ENCOUNTER — Inpatient Hospital Stay (HOSPITAL_COMMUNITY)
Admission: AD | Admit: 2011-05-07 | Discharge: 2011-05-07 | Disposition: A | Discharge status: Home / Self Care | Payer: Medicaid Other | Source: Ambulatory Visit | Attending: Obstetrics & Gynecology | Admitting: Obstetrics & Gynecology

## 2011-05-07 DIAGNOSIS — N949 Unspecified condition associated with female genital organs and menstrual cycle: Secondary | ICD-10-CM | POA: Insufficient documentation

## 2011-05-07 DIAGNOSIS — A499 Bacterial infection, unspecified: Secondary | ICD-10-CM | POA: Insufficient documentation

## 2011-05-07 DIAGNOSIS — N760 Acute vaginitis: Secondary | ICD-10-CM | POA: Insufficient documentation

## 2011-05-07 DIAGNOSIS — B9689 Other specified bacterial agents as the cause of diseases classified elsewhere: Secondary | ICD-10-CM | POA: Insufficient documentation

## 2011-05-07 LAB — URINALYSIS, ROUTINE W REFLEX MICROSCOPIC
Bilirubin Urine: NEGATIVE
Ketones, ur: NEGATIVE mg/dL
Nitrite: NEGATIVE
Protein, ur: 100 mg/dL — AB
Specific Gravity, Urine: 1.025 (ref 1.005–1.030)
Urobilinogen, UA: 1 mg/dL (ref 0.0–1.0)

## 2011-05-07 LAB — URINE MICROSCOPIC-ADD ON

## 2011-05-07 LAB — WET PREP, GENITAL
Trich, Wet Prep: NONE SEEN
Yeast Wet Prep HPF POC: NONE SEEN

## 2011-05-08 LAB — GC/CHLAMYDIA PROBE AMP, GENITAL
Chlamydia, DNA Probe: NEGATIVE
GC Probe Amp, Genital: NEGATIVE

## 2011-07-13 ENCOUNTER — Emergency Department: Payer: Self-pay | Admitting: Internal Medicine

## 2011-08-21 LAB — PREGNANCY, URINE: Preg Test, Ur: NEGATIVE

## 2011-08-21 LAB — POCT I-STAT, CHEM 8
BUN: 6
Calcium, Ion: 1.22
Chloride: 105
Creatinine, Ser: 0.8

## 2011-08-21 LAB — URINALYSIS, ROUTINE W REFLEX MICROSCOPIC
Bilirubin Urine: NEGATIVE
Nitrite: NEGATIVE
Specific Gravity, Urine: 1.008
pH: 7

## 2011-08-21 LAB — CBC
Hemoglobin: 14.2
RBC: 4.86
RDW: 13.4
WBC: 12.9 — ABNORMAL HIGH

## 2011-08-21 LAB — DIFFERENTIAL
Basophils Absolute: 0.1
Lymphocytes Relative: 24
Lymphs Abs: 3.1
Monocytes Absolute: 1.2 — ABNORMAL HIGH
Monocytes Relative: 10
Neutro Abs: 8.4 — ABNORMAL HIGH

## 2011-08-21 LAB — URINE MICROSCOPIC-ADD ON

## 2011-08-28 LAB — URINALYSIS, ROUTINE W REFLEX MICROSCOPIC
Protein, ur: 30 — AB
Urobilinogen, UA: 1

## 2011-08-28 LAB — URINE CULTURE

## 2011-08-28 LAB — URINE MICROSCOPIC-ADD ON

## 2011-08-30 LAB — CBC
HCT: 28.7 — ABNORMAL LOW
MCV: 87.4
Platelets: 184
RDW: 13.3

## 2011-09-02 LAB — CBC
HCT: 34.8 — ABNORMAL LOW
Platelets: 226
RDW: 13.1

## 2011-09-03 LAB — WET PREP, GENITAL
Trich, Wet Prep: NONE SEEN
Yeast Wet Prep HPF POC: NONE SEEN

## 2011-09-04 LAB — URINALYSIS, ROUTINE W REFLEX MICROSCOPIC
Glucose, UA: NEGATIVE
Protein, ur: NEGATIVE
pH: 7

## 2011-09-04 LAB — URINE MICROSCOPIC-ADD ON

## 2011-09-12 LAB — URINALYSIS, ROUTINE W REFLEX MICROSCOPIC
Glucose, UA: NEGATIVE
Leukocytes, UA: NEGATIVE
Nitrite: NEGATIVE
Protein, ur: NEGATIVE
Urobilinogen, UA: 2 — ABNORMAL HIGH

## 2011-09-12 LAB — URINE MICROSCOPIC-ADD ON

## 2011-09-12 LAB — URINE CULTURE: Colony Count: >=100000

## 2011-10-20 ENCOUNTER — Emergency Department: Payer: Self-pay | Admitting: Emergency Medicine

## 2011-11-05 ENCOUNTER — Emergency Department: Payer: Self-pay | Admitting: Emergency Medicine

## 2011-11-07 ENCOUNTER — Encounter: Payer: Self-pay | Admitting: Obstetrics and Gynecology

## 2012-02-26 ENCOUNTER — Telehealth: Payer: Self-pay | Admitting: Cardiology

## 2012-02-26 NOTE — Telephone Encounter (Signed)
LOV (2007) faxed to [facility] @ [phone] 02/26/12/Km

## 2012-03-27 ENCOUNTER — Other Ambulatory Visit: Payer: Self-pay | Admitting: Advanced Practice Midwife

## 2012-03-27 ENCOUNTER — Inpatient Hospital Stay (HOSPITAL_COMMUNITY)
Admission: AD | Admit: 2012-03-27 | Discharge: 2012-03-27 | Disposition: A | Discharge status: Home / Self Care | Payer: Self-pay | Source: Ambulatory Visit | Attending: Obstetrics & Gynecology | Admitting: Obstetrics & Gynecology

## 2012-03-27 ENCOUNTER — Encounter (HOSPITAL_COMMUNITY): Payer: Self-pay | Admitting: *Deleted

## 2012-03-27 DIAGNOSIS — O479 False labor, unspecified: Secondary | ICD-10-CM

## 2012-03-27 DIAGNOSIS — N390 Urinary tract infection, site not specified: Secondary | ICD-10-CM | POA: Insufficient documentation

## 2012-03-27 DIAGNOSIS — O2340 Unspecified infection of urinary tract in pregnancy, unspecified trimester: Secondary | ICD-10-CM

## 2012-03-27 DIAGNOSIS — O2390 Unspecified genitourinary tract infection in pregnancy, unspecified trimester: Secondary | ICD-10-CM | POA: Insufficient documentation

## 2012-03-27 DIAGNOSIS — O4700 False labor before 37 completed weeks of gestation, unspecified trimester: Secondary | ICD-10-CM | POA: Insufficient documentation

## 2012-03-27 HISTORY — DX: Headache: R51

## 2012-03-27 HISTORY — DX: Cardiac arrhythmia, unspecified: I49.9

## 2012-03-27 LAB — URINALYSIS, ROUTINE W REFLEX MICROSCOPIC
Ketones, ur: NEGATIVE mg/dL
Protein, ur: 30 mg/dL — AB
Urobilinogen, UA: 2 mg/dL — ABNORMAL HIGH (ref 0.0–1.0)

## 2012-03-27 LAB — URINE MICROSCOPIC-ADD ON

## 2012-03-27 LAB — WET PREP, GENITAL: Yeast Wet Prep HPF POC: NONE SEEN

## 2012-03-27 MED ORDER — NIFEDIPINE ER OSMOTIC RELEASE 60 MG PO TB24: 60.0000 mg | ORAL_TABLET | Freq: Every day | ORAL | Status: DC

## 2012-03-27 MED ORDER — NITROFURANTOIN MONOHYD MACRO 100 MG PO CAPS: 100.0000 mg | ORAL_CAPSULE | Freq: Two times a day (BID) | ORAL | Status: DC

## 2012-03-27 NOTE — MAU Provider Note (Signed)
History     CSN: [id number]  Arrival date and time: 03/27/12 1315   First Provider Initiated Contact with Patient 03/27/12 1457      Chief Complaint  Patient presents with  . Labor Eval   HPI 31 y.o. [id number] at [redacted]w[redacted]d with c/o contractions q 10 minutes at home. States she has had contractions for "weeks", but has noticed "only 3 of the real ones". Difficult to get clear history of pains, describes pain with fetal movement and does not differentiate well between these pains and painful contractions. Denies bleeding or LOF. H/O 2 prior term deliveries.   Past Medical History  Diagnosis Date  . Arrhythmia   . Headache     Past Surgical History  Procedure Date  . Cesarean section     Family History  Problem Relation Age of Onset  . Alcohol abuse Neg Hx   . Arthritis Neg Hx   . Asthma Neg Hx   . Birth defects Neg Hx   . Cancer Neg Hx   . COPD Neg Hx   . Depression Neg Hx   . Diabetes Neg Hx   . Drug abuse Neg Hx   . Early death Neg Hx   . Hearing loss Neg Hx   . Heart disease Neg Hx   . Hyperlipidemia Neg Hx   . Hypertension Neg Hx   . Kidney disease Neg Hx   . Learning disabilities Neg Hx   . Mental illness Neg Hx   . Mental retardation Neg Hx   . Miscarriages / Stillbirths Neg Hx   . Stroke Neg Hx   . Vision loss Neg Hx     History  Substance Use Topics  . Smoking status: Never Smoker   . Smokeless tobacco: Not on file  . Alcohol Use: No    Allergies:  Allergies  Allergen Reactions  . Amoxicillin Other (See Comments)    Swells up and itches    Prescriptions prior to admission  Medication Sig Dispense Refill  . omeprazole (PRILOSEC) 20 MG capsule Take 20 mg by mouth daily.        Review of Systems  Constitutional: Negative.   Respiratory: Negative.   Cardiovascular: Negative.   Gastrointestinal: Negative for nausea, vomiting, abdominal pain, diarrhea and constipation.  Genitourinary: Negative for dysuria, urgency, frequency, hematuria and flank  pain.       Negative for vaginal bleeding, vaginal discharge, + for contractions   Musculoskeletal: Negative.   Neurological: Negative.   Psychiatric/Behavioral: Negative.    Physical Exam   Blood pressure 106/64, pulse 104, temperature 98.2 F (36.8 C), temperature source Oral, resp. rate 18.  Physical Exam  Nursing note and vitals reviewed. Constitutional: She is oriented to person, place, and time. She appears well-developed and well-nourished. No distress.  Cardiovascular: Normal rate.   Respiratory: Effort normal.  GI: Soft. There is no tenderness.  Genitourinary: No bleeding around the vagina. Vaginal discharge (white) found.       SVE: 1/25/high  Musculoskeletal: Normal range of motion.  Neurological: She is alert and oriented to person, place, and time.  Skin: Skin is warm and dry.  Psychiatric: She has a normal mood and affect.   EFM reactive, TOCO: 2 UCs in 1.5 hours MAU Course  Procedures  Results for orders placed during the hospital encounter of 03/27/12 (from the past 24 hour(s))  URINALYSIS, ROUTINE W REFLEX MICROSCOPIC     Status: Abnormal   Collection Time   03/27/12  1:25 PM  Component Value Range   Color, Urine YELLOW  YELLOW    APPearance CLEAR  CLEAR    Specific Gravity, Urine 1.025  1.005 - 1.030    pH 6.5  5.0 - 8.0    Glucose, UA NEGATIVE  NEGATIVE (mg/dL)   Hgb urine dipstick LARGE (*) NEGATIVE    Bilirubin Urine NEGATIVE  NEGATIVE    Ketones, ur NEGATIVE  NEGATIVE (mg/dL)   Protein, ur 30 (*) NEGATIVE (mg/dL)   Urobilinogen, UA 2.0 (*) 0.0 - 1.0 (mg/dL)   Nitrite NEGATIVE  NEGATIVE    Leukocytes, UA MODERATE (*) NEGATIVE   URINE MICROSCOPIC-ADD ON     Status: Abnormal   Collection Time   03/27/12  1:25 PM      Component Value Range   Squamous Epithelial / LPF MANY (*) RARE    WBC, UA 11-20  <3 (WBC/hpf)   RBC / HPF 0-2  <3 (RBC/hpf)   Bacteria, UA MANY (*) RARE   WET PREP, GENITAL     Status: Abnormal   Collection Time   03/27/12  2:50  PM      Component Value Range   Yeast Wet Prep HPF POC NONE SEEN  NONE SEEN    Trich, Wet Prep NONE SEEN  NONE SEEN    Clue Cells Wet Prep HPF POC NONE SEEN  NONE SEEN    WBC, Wet Prep HPF POC FEW (*) NONE SEEN      Assessment and Plan  31 y.o. [id number] at [redacted]w[redacted]d  UTI in pregnancy - rx Macrobid Few preterm contractions - rev'd warning signs, encouraged hydration and rest - FFN sent to lab, prior to result coming back, pt states that she has to leave d/t transportation issue F/U as scheduled or sooner PRN  Ayad Nieman,[place] 03/27/2012, 3:20 PM

## 2012-03-29 ENCOUNTER — Inpatient Hospital Stay (HOSPITAL_COMMUNITY)
Admission: AD | Admit: 2012-03-29 | Discharge: 2012-03-29 | Disposition: A | Discharge status: Home / Self Care | Payer: Medicaid Other | Source: Ambulatory Visit | Attending: Obstetrics | Admitting: Obstetrics

## 2012-03-29 ENCOUNTER — Encounter (HOSPITAL_COMMUNITY): Payer: Self-pay | Admitting: Obstetrics and Gynecology

## 2012-03-29 DIAGNOSIS — O2390 Unspecified genitourinary tract infection in pregnancy, unspecified trimester: Secondary | ICD-10-CM | POA: Insufficient documentation

## 2012-03-29 DIAGNOSIS — N390 Urinary tract infection, site not specified: Secondary | ICD-10-CM | POA: Insufficient documentation

## 2012-03-29 DIAGNOSIS — O2342 Unspecified infection of urinary tract in pregnancy, second trimester: Secondary | ICD-10-CM

## 2012-03-29 DIAGNOSIS — R109 Unspecified abdominal pain: Secondary | ICD-10-CM | POA: Insufficient documentation

## 2012-03-29 LAB — URINE MICROSCOPIC-ADD ON

## 2012-03-29 LAB — URINALYSIS, ROUTINE W REFLEX MICROSCOPIC
Nitrite: NEGATIVE
Specific Gravity, Urine: >1.03 — ABNORMAL HIGH (ref 1.005–1.030)
Urobilinogen, UA: 1 mg/dL (ref 0.0–1.0)
pH: 6.5 (ref 5.0–8.0)

## 2012-03-29 MED ORDER — SULFAMETHOXAZOLE-TRIMETHOPRIM 800-160 MG PO TABS: 1.0000 | ORAL_TABLET | Freq: Two times a day (BID) | ORAL | Status: AC

## 2012-03-29 NOTE — MAU Provider Note (Signed)
History     CSN: [id number]  Arrival date & time 03/29/12  1832   None     Chief Complaint  Patient presents with  . Abdominal Pain    (Consider location/radiation/quality/duration/timing/severity/associated sxs/prior treatment) HPI Ann Taylor is a 31 y.o.  [id number] at [redacted]w[redacted]d. She has been a pt of [facility]. She had MAU visit 5/2 for contractions. Her cx was 1 cm, 25 %, high. She was dx with UTI, Rx for Macrobid. The pt has lost her Medicaid because she moved to another county and cannot afford the medication. She returns today with continued pain. She has bil low back pain that radiates bil to low abd and shoots into her vagina. She has throbbing in her private parts, her legs and feet.  Past Medical History  Diagnosis Date  . Arrhythmia   . Headache     Past Surgical History  Procedure Date  . Cesarean section     Family History  Problem Relation Age of Onset  . Alcohol abuse Neg Hx   . Arthritis Neg Hx   . Asthma Neg Hx   . Birth defects Neg Hx   . Cancer Neg Hx   . COPD Neg Hx   . Depression Neg Hx   . Diabetes Neg Hx   . Drug abuse Neg Hx   . Early death Neg Hx   . Hearing loss Neg Hx   . Heart disease Neg Hx   . Hyperlipidemia Neg Hx   . Hypertension Neg Hx   . Kidney disease Neg Hx   . Learning disabilities Neg Hx   . Mental illness Neg Hx   . Mental retardation Neg Hx   . Miscarriages / Stillbirths Neg Hx   . Stroke Neg Hx   . Vision loss Neg Hx     History  Substance Use Topics  . Smoking status: Never Smoker   . Smokeless tobacco: Not on file  . Alcohol Use: No    OB History    Grav Para Term Preterm Abortions TAB SAB Ect Mult Living   3 2 2  0 0 0 0 0 0 2      Review of Systems  Constitutional: Negative for fever and chills.  Genitourinary: Positive for vaginal pain and pelvic pain. Negative for vaginal bleeding and vaginal discharge.    Allergies  Shellfish allergy and Amoxicillin  Home Medications  No current outpatient  prescriptions on file.  BP 122/68  Pulse 97  Temp(Src) 97.7 F (36.5 C) (Oral)  Resp 18  Physical Exam  Constitutional: She is oriented to person, place, and time. She appears well-developed and well-nourished.  Abdominal: Soft. There is no tenderness. There is no guarding.  Genitourinary:       Cx unchanged from exam 5/2- 1 cm, 25%, high  Musculoskeletal: Normal range of motion.  Neurological: She is alert and oriented to person, place, and time.  Skin: Skin is warm and dry.  Psychiatric: She has a normal mood and affect. Her behavior is normal.    ED Course  Procedures (including critical care time)  Labs Reviewed  URINALYSIS, ROUTINE W REFLEX MICROSCOPIC - Abnormal; Notable for the following:    Specific Gravity, Urine >1.030 (*)    Hgb urine dipstick MODERATE (*)    Protein, ur 30 (*)    Leukocytes, UA TRACE (*)    All other components within normal limits  URINE MICROSCOPIC-ADD ON - Abnormal; Notable for the following:    Squamous Epithelial /  LPF FEW (*)    Bacteria, UA FEW (*)    All other components within normal limits   No results found. ASSESSMENT:  Known UTI not being treated Pregnant at [redacted] wks, cervix unchanged.  No diagnosis found. PLAN:  Pt can borrow $4.00 for Septra DS at [facility] or [employer] which she will get and take. Find out her insurance coverage- if losing Medicaid make an appt with [facility].  Return as needed   MDM

## 2012-03-29 NOTE — MAU Note (Signed)
Pt presents to MAU with chief complaint of lower abdominal pain. Pt is [redacted]w[redacted]d; G3P2; was here last Thursday and had a + fetal fibronectin. Pt says she feels sharp shoot pain in her lower abdomen and vagina.

## 2012-03-29 NOTE — Progress Notes (Signed)
No cervical change from last visit.

## 2012-11-04 ENCOUNTER — Emergency Department (HOSPITAL_COMMUNITY)
Admission: EM | Admit: 2012-11-04 | Discharge: 2012-11-05 | Disposition: A | Discharge status: Home / Self Care | Payer: Medicaid Other | Attending: Emergency Medicine | Admitting: Emergency Medicine

## 2012-11-04 ENCOUNTER — Encounter (HOSPITAL_COMMUNITY): Payer: Self-pay | Admitting: Emergency Medicine

## 2012-11-04 DIAGNOSIS — R059 Cough, unspecified: Secondary | ICD-10-CM | POA: Insufficient documentation

## 2012-11-04 DIAGNOSIS — R51 Headache: Secondary | ICD-10-CM | POA: Insufficient documentation

## 2012-11-04 DIAGNOSIS — Z79899 Other long term (current) drug therapy: Secondary | ICD-10-CM | POA: Insufficient documentation

## 2012-11-04 DIAGNOSIS — J020 Streptococcal pharyngitis: Secondary | ICD-10-CM | POA: Insufficient documentation

## 2012-11-04 DIAGNOSIS — R05 Cough: Secondary | ICD-10-CM | POA: Insufficient documentation

## 2012-11-04 DIAGNOSIS — Z8679 Personal history of other diseases of the circulatory system: Secondary | ICD-10-CM | POA: Insufficient documentation

## 2012-11-04 MED ORDER — ACETAMINOPHEN 325 MG PO TABS
ORAL_TABLET | ORAL | Status: AC
  Administered 2012-11-04: 325 mg
  Filled 2012-11-04: qty 2

## 2012-11-04 NOTE — ED Notes (Signed)
Pt states she started having body aches, sore throat, and dizziness early this am.  She stated her temp at home was 103. Progressing weakness as the day progressed.

## 2012-11-04 NOTE — ED Notes (Signed)
Pt c/o fever, cough and sore throat today, decreased appetite, dizziness, headache.

## 2012-11-05 MED ORDER — SODIUM CHLORIDE 0.9 % IV BOLUS (SEPSIS)
1000.0000 mL | Freq: Once | INTRAVENOUS | Status: AC
  Administered 2012-11-05: 1000 mL via INTRAVENOUS

## 2012-11-05 MED ORDER — CLINDAMYCIN HCL 150 MG PO CAPS: 150.0000 mg | ORAL_CAPSULE | Freq: Four times a day (QID) | ORAL | Status: DC

## 2012-11-05 MED ORDER — HYDROCODONE-ACETAMINOPHEN 7.5-500 MG/15ML PO SOLN: 15.0000 mL | Freq: Four times a day (QID) | ORAL | Status: DC | PRN

## 2012-11-05 MED ORDER — CLINDAMYCIN HCL 300 MG PO CAPS
300.0000 mg | ORAL_CAPSULE | Freq: Once | ORAL | Status: AC
  Administered 2012-11-05: 300 mg via ORAL
  Filled 2012-11-05: qty 1

## 2012-11-05 NOTE — ED Provider Notes (Signed)
Medical screening examination/treatment/procedure(s) were performed by non-physician practitioner and as supervising physician I was immediately available for consultation/collaboration.    Kendrick Remigio D Margel Joens, MD 11/05/12 0805 

## 2012-11-05 NOTE — ED Provider Notes (Signed)
History     CSN: [id number]  Arrival date & time 11/04/12  2318   First MD Initiated Contact with Patient 11/04/12 2341      Chief Complaint  Patient presents with  . Fever    (Consider location/radiation/quality/duration/timing/severity/associated sxs/prior treatment) HPI  31 year old female presents complaining of fever and sore throat. Patient has a history of migraine headache R. databases and has continued to endorse headache worsened in the past week. Describe the headache as a throbbing sensation with mild right and sound sensitivity, which is usual for her. Today she experienced increased sore throat, with occasional cough. She does endorse decreased appetite, lightheadedness, and body aches. She has tried taking Benadryl and Excedrin without adequate relief. She has not taken any antibiotic medication. She denies vision change, sneezing, running nose, ear pain, neck stiffness, chest pain, shortness of breath, abdominal pain, nausea, vomiting, diarrhea, or dysuria. She denies any rash. No recent sick contact. No tick exposure.  Past Medical History  Diagnosis Date  . Arrhythmia   . Headache     Past Surgical History  Procedure Date  . Cesarean section     Family History  Problem Relation Age of Onset  . Alcohol abuse Neg Hx   . Arthritis Neg Hx   . Asthma Neg Hx   . Birth defects Neg Hx   . Cancer Neg Hx   . COPD Neg Hx   . Depression Neg Hx   . Diabetes Neg Hx   . Drug abuse Neg Hx   . Early death Neg Hx   . Hearing loss Neg Hx   . Heart disease Neg Hx   . Hyperlipidemia Neg Hx   . Hypertension Neg Hx   . Kidney disease Neg Hx   . Learning disabilities Neg Hx   . Mental illness Neg Hx   . Mental retardation Neg Hx   . Miscarriages / Stillbirths Neg Hx   . Stroke Neg Hx   . Vision loss Neg Hx     History  Substance Use Topics  . Smoking status: Never Smoker   . Smokeless tobacco: Not on file  . Alcohol Use: No    OB History    Grav Para Term  Preterm Abortions TAB SAB Ect Mult Living   3 2 2  0 0 0 0 0 0 2      Review of Systems  All other systems reviewed and are negative.    Allergies  Shellfish allergy and Amoxicillin  Home Medications   Current Outpatient Rx  Name  Route  Sig  Dispense  Refill  . DIPHENHYDRAMINE HCL (SLEEP) 25 MG PO TABS   Oral   Take 25 mg by mouth at bedtime as needed. For sleep         . OMEPRAZOLE 20 MG PO CPDR   Oral   Take 20 mg by mouth daily.           BP 136/91  Pulse 136  Temp 102.5 F (39.2 C) (Oral)  Resp 18  Wt 129 lb 3.2 oz (58.605 kg)  SpO2 100%  Breastfeeding? Unknown  Physical Exam  Nursing note and vitals reviewed. Constitutional: She is oriented to person, place, and time. She appears well-developed and well-nourished. No distress.       Awake, alert, nontoxic appearance  HENT:  Head: Normocephalic and atraumatic.       Left TM is mildly edematous, and dull.  R TM normal.    Nose normal  Throat: uvula  midline, bilateral tonsilar enlargement and erythematous without exudates.  No evidence of deep tissue infection.   Eyes: Conjunctivae normal and EOM are normal. Pupils are equal, round, and reactive to light. Right eye exhibits no discharge. Left eye exhibits no discharge.  Neck: Normal range of motion. Neck supple.       No nuchal rigidity, no meningismal signs.   Cardiovascular: Regular rhythm.        Tachycardia without M/R/G  Pulmonary/Chest: Effort normal. No respiratory distress. She exhibits no tenderness.  Abdominal: Soft. There is no tenderness. There is no rebound.  Musculoskeletal: Normal range of motion. She exhibits no tenderness.       ROM appears intact, no obvious focal weakness  Neurological: She is alert and oriented to person, place, and time.       Mental status and motor strength appears intact  Skin: No rash noted.  Psychiatric: She has a normal mood and affect.    ED Course  Procedures (including critical care time)  Labs  Reviewed - No data to display No results found.   No diagnosis found.   Date: 11/05/2012  Rate: 108  Rhythm: sinus tachycardia  QRS Axis: normal  Intervals: normal  ST/T Wave abnormalities: normal  Conduction Disutrbances:none  Narrative Interpretation:   Old EKG Reviewed: none available  Results for orders placed during the hospital encounter of 11/04/12  RAPID STREP SCREEN      Component Value Range   Streptococcus, Group A Screen (Direct) POSITIVE (*) NEGATIVE   No results found.  1. Strep pharyngitis  MDM  Pt presents with headache x 1 week, sore throat x 1 day and fever.  She is febrile at 102.5, and tachycardic.  HOwever, does not appears toxic, and has no nuchal rigidity concerning for meningitis.    12:46 AM Strep test positive.  Allergic to PCN, unknown reaction.  Will give clindamycin.  Will continue IVF.    3:11 AM Patient felt much better after receiving treatment. She is afebrile and her heart rate has improved. She is stable to be discharged. Refer to ENT as needed. Discussed with my attending.  BP 118/62  Pulse 112  Temp 98.5 F (36.9 C) (Oral)  Resp 22  Wt 129 lb 3.2 oz (58.605 kg)  SpO2 100%  Breastfeeding? Unknown  I have reviewed nursing notes and vital signs.   I reviewed available ER/hospitalization records thought the EMR    Fayrene Helper, [place] 11/05/12 1610

## 2013-04-28 ENCOUNTER — Encounter (HOSPITAL_COMMUNITY): Payer: Self-pay | Admitting: Emergency Medicine

## 2013-04-28 ENCOUNTER — Emergency Department (HOSPITAL_COMMUNITY): Payer: Medicaid Other

## 2013-04-28 ENCOUNTER — Emergency Department (HOSPITAL_COMMUNITY)
Admission: EM | Admit: 2013-04-28 | Discharge: 2013-04-29 | Disposition: A | Discharge status: Home / Self Care | Payer: Medicaid Other | Attending: Emergency Medicine | Admitting: Emergency Medicine

## 2013-04-28 DIAGNOSIS — R42 Dizziness and giddiness: Secondary | ICD-10-CM

## 2013-04-28 DIAGNOSIS — I498 Other specified cardiac arrhythmias: Secondary | ICD-10-CM

## 2013-04-28 DIAGNOSIS — Z3202 Encounter for pregnancy test, result negative: Secondary | ICD-10-CM | POA: Insufficient documentation

## 2013-04-28 DIAGNOSIS — R0789 Other chest pain: Secondary | ICD-10-CM | POA: Insufficient documentation

## 2013-04-28 DIAGNOSIS — Z79899 Other long term (current) drug therapy: Secondary | ICD-10-CM | POA: Insufficient documentation

## 2013-04-28 DIAGNOSIS — I499 Cardiac arrhythmia, unspecified: Secondary | ICD-10-CM | POA: Insufficient documentation

## 2013-04-28 DIAGNOSIS — J029 Acute pharyngitis, unspecified: Secondary | ICD-10-CM | POA: Insufficient documentation

## 2013-04-28 LAB — BASIC METABOLIC PANEL
BUN: 11 mg/dL (ref 6–23)
Calcium: 9.3 mg/dL (ref 8.4–10.5)
Creatinine, Ser: 0.66 mg/dL (ref 0.50–1.10)
GFR calc Af Amer: >90 mL/min (ref >90)

## 2013-04-28 LAB — CBC
HCT: 38.5 % (ref 36.0–46.0)
MCH: 28.9 pg (ref 26.0–34.0)
MCV: 83.7 fL (ref 78.0–100.0)
Platelets: 290 10*3/uL (ref 150–400)
RDW: 13.7 % (ref 11.5–15.5)

## 2013-04-28 LAB — PRO B NATRIURETIC PEPTIDE: Pro B Natriuretic peptide (BNP): 40.1 pg/mL (ref 0–125)

## 2013-04-28 LAB — POCT PREGNANCY, URINE: Preg Test, Ur: NEGATIVE

## 2013-04-28 NOTE — ED Notes (Addendum)
C/o constant sharp pain in center of chest, dizziness, sob, headache, sore throat, nausea, generalized weakness, and swelling to lower extremities x 1 week.  Pt has non-productive cough.

## 2013-04-28 NOTE — ED Notes (Signed)
C/O tightness in her chest, Nausea, lightheaded that began 1 week ago.  C/O her feet swelling at night.  Stated that her chest tightness and shortness of breath worsened with activity over the last 2 days. No pedal edema noted.

## 2013-04-28 NOTE — ED Provider Notes (Signed)
History     CSN: [id number]  Arrival date & time 04/28/13  1959   First MD Initiated Contact with Patient 04/28/13 2126      Chief Complaint  Patient presents with  . Dizziness  . Chest Pain  . Sore Throat    (Consider location/radiation/quality/duration/timing/severity/associated sxs/prior treatment) HPI  Ann Taylor is a 32 y.o. female with a history of sinus arrythmia presents to the ED with a complaint of chest tightness, dizziness & lightheadedness. Symptoms have occurred everyday for the last week and only slightly improve when the pt rests. Associated symptoms include HA, nausea, generalized weakness and diaphoresis. Pt denies fever, confusion, seizures, vision changes, vomiting, diarrhea. She does not smoke or consume EtOH and reports a family history of DM, sickle cell anemia, CHF, and HTN. Note that pt has a history of similar episodes since 2007 that was evaluated by [facility].   Past Medical History  Diagnosis Date  . Arrhythmia   . NWGNFAOZ(308.6)     Past Surgical History  Procedure Laterality Date  . Cesarean section      Family History  Problem Relation Age of Onset  . Alcohol abuse Neg Hx   . Arthritis Neg Hx   . Asthma Neg Hx   . Birth defects Neg Hx   . Cancer Neg Hx   . COPD Neg Hx   . Depression Neg Hx   . Diabetes Neg Hx   . Drug abuse Neg Hx   . Early death Neg Hx   . Hearing loss Neg Hx   . Heart disease Neg Hx   . Hyperlipidemia Neg Hx   . Hypertension Neg Hx   . Kidney disease Neg Hx   . Learning disabilities Neg Hx   . Mental illness Neg Hx   . Mental retardation Neg Hx   . Miscarriages / Stillbirths Neg Hx   . Stroke Neg Hx   . Vision loss Neg Hx     History  Substance Use Topics  . Smoking status: Never Smoker   . Smokeless tobacco: Not on file  . Alcohol Use: No    OB History   Grav Para Term Preterm Abortions TAB SAB Ect Mult Living   3 2 2  0 0 0 0 0 0 2      Review of Systems  All other systems  reviewed and are negative.    Allergies  Shellfish allergy and Amoxicillin  Home Medications   Current Outpatient Rx  Name  Route  Sig  Dispense  Refill  . diphenhydrAMINE (SOMINEX) 25 MG tablet   Oral   Take 25 mg by mouth at bedtime as needed. For sleep         . omeprazole (PRILOSEC) 20 MG capsule   Oral   Take 20 mg by mouth daily.           BP 126/97  Pulse 86  Temp(Src) 98.3 F (36.8 C)  Resp 14  SpO2 100%  Physical Exam  Nursing note and vitals reviewed. Constitutional: She is oriented to person, place, and time. She appears well-developed and well-nourished. No distress.  Patient not in visible distress  HENT:  Head: Normocephalic and atraumatic.  Eyes: Conjunctivae and EOM are normal. Pupils are equal, round, and reactive to light.  Neck: Normal range of motion. Neck supple. Normal carotid pulses, no hepatojugular reflux and no JVD present. Carotid bruit is not present.  No carotid bruits  Cardiovascular:  RRR, no aberrancy on auscultation,  intact distal pulses no pitting edema bilaterally.  Pulmonary/Chest: Effort normal and breath sounds normal. No respiratory distress.  Lungs clear to auscultation bilaterally  Abdominal: Soft. She exhibits no distension. There is no tenderness.  Nonpulsatile aorta  Musculoskeletal: Normal range of motion. She exhibits no tenderness.  Neurological: She is alert and oriented to person, place, and time. She displays a negative Romberg sign.  Cranial nerves III through XII intact, normal coordination, negative Romberg, strength 5/5 bilaterally. No ataxia  Skin: Skin is warm and dry. No pallor.  Psychiatric: She has a normal mood and affect. Her behavior is normal.    ED Course  Procedures (including critical care time)  Labs Reviewed  URINALYSIS, ROUTINE W REFLEX MICROSCOPIC - Abnormal; Notable for the following:    APPearance CLOUDY (*)    Hgb urine dipstick SMALL (*)    Leukocytes, UA MODERATE (*)    All other  components within normal limits  URINE MICROSCOPIC-ADD ON - Abnormal; Notable for the following:    Squamous Epithelial / LPF MANY (*)    Bacteria, UA FEW (*)    All other components within normal limits  URINE CULTURE  CBC  BASIC METABOLIC PANEL  PRO B NATRIURETIC PEPTIDE  POCT I-STAT TROPONIN I  POCT PREGNANCY, URINE   Dg Chest 2 View  04/28/2013   *RADIOLOGY REPORT*  Clinical Data: Dizziness.  Chest pain.  Sore throat.  CHEST - 2 VIEW  Comparison: 10/31/2005.  Findings:  Cardiopericardial silhouette within normal limits. Mediastinal contours normal. Trachea midline.  No airspace disease or effusion.  IMPRESSION: Negative two-view chest.   Original Report Authenticated By: Andreas Newport, M.D.    Date: 04/29/2013  Rate: 76  Rhythm: normal sinus rhythm and sinus arrythmia   QRS Axis: normal  Intervals: normal  ST/T Wave abnormalities: normal  Conduction Disutrbances:none  Narrative Interpretation:   Old EKG Reviewed: unchanged    No diagnosis found.  BP 126/97  Pulse 86  Temp(Src) 98.3 F (36.8 C)  Resp 14  SpO2 100%   MDM   Patient emergency department for evaluation of dizziness, lightheadedness and palpitations.  History of similar symptoms 2007 that was evaluated by [facility].  Patient has had similar episodes throughout the last couple of years however more have occurred during the last week. No complete syncopal episodes. Pt asymptomatic throughput hospital stay and ambulates with out difficulty. Negative orthostatics.  Patient found to have normal sinus rhythm while in the emergency department and mild sinus arrhythmia on ECG.  Patient has been advised to follow up with [facility].  Strict return precautions discussed.       Jaci Carrel, [place] 04/29/13 0011

## 2013-04-29 LAB — URINALYSIS, ROUTINE W REFLEX MICROSCOPIC
Glucose, UA: NEGATIVE mg/dL
Specific Gravity, Urine: 1.021 (ref 1.005–1.030)
pH: 6.5 (ref 5.0–8.0)

## 2013-04-29 LAB — URINE MICROSCOPIC-ADD ON

## 2013-04-30 ENCOUNTER — Encounter: Payer: Self-pay | Admitting: Internal Medicine

## 2013-04-30 ENCOUNTER — Ambulatory Visit (INDEPENDENT_AMBULATORY_CARE_PROVIDER_SITE_OTHER): Payer: Medicaid Other | Admitting: Internal Medicine

## 2013-04-30 ENCOUNTER — Encounter: Payer: Medicaid Other | Admitting: Internal Medicine

## 2013-04-30 VITALS — BP 122/89 | HR 79 | Ht 65.0 in | Wt 131.8 lb

## 2013-04-30 DIAGNOSIS — R0602 Shortness of breath: Secondary | ICD-10-CM

## 2013-04-30 LAB — URINE CULTURE
Colony Count: NO GROWTH
Culture: NO GROWTH

## 2013-04-30 LAB — TSH: TSH: 0.41 u[IU]/mL (ref 0.35–5.50)

## 2013-04-30 MED ORDER — RANITIDINE HCL 150 MG PO TABS: 150.0000 mg | ORAL_TABLET | Freq: Two times a day (BID) | ORAL | Status: DC

## 2013-04-30 NOTE — Patient Instructions (Addendum)
LABS TODAY:  D-Dimer. TSH  Your physician has requested that you have an echocardiogram. Echocardiography is a painless test that uses sound waves to create images of your heart. It provides your doctor with information about the size and shape of your heart and how well your heart's chambers and valves are working. This procedure takes approximately one hour. There are no restrictions for this procedure.  Start Zantac 150mg  twice daily.

## 2013-04-30 NOTE — Progress Notes (Signed)
HPI  Patient is a 32 yo who wsa seen in ER 6/4 for dizziness.  Orthostatics neg.Chest tightness for 1 week.   Always had SOB  Rarely  chest pain. COnstant pains  Something heavy on chest.   Temporal headaches chronic Bad migraines Not sleeping well  Gasp for air. No syncope   She was seen in 2007 for CP and palpit;aitons.  Echo was done which was normal Maternal side of family with history of CAD  Grandmothers with CHF    Allergies  Allergen Reactions  . Shellfish Allergy Anaphylaxis  . Amoxicillin Hives and Swelling    Swelling of extremities    No current outpatient prescriptions on file.   No current facility-administered medications for this visit.    Past Medical History  Diagnosis Date  . Arrhythmia   . ZOXWRUEA(540.9)     Past Surgical History  Procedure Laterality Date  . Cesarean section      Family History  Problem Relation Age of Onset  . Alcohol abuse Neg Hx   . Arthritis Neg Hx   . Asthma Neg Hx   . Birth defects Neg Hx   . Cancer Neg Hx   . COPD Neg Hx   . Depression Neg Hx   . Diabetes Neg Hx   . Drug abuse Neg Hx   . Early death Neg Hx   . Hearing loss Neg Hx   . Heart disease Neg Hx   . Hyperlipidemia Neg Hx   . Hypertension Neg Hx   . Kidney disease Neg Hx   . Learning disabilities Neg Hx   . Mental illness Neg Hx   . Mental retardation Neg Hx   . Miscarriages / Stillbirths Neg Hx   . Stroke Neg Hx   . Vision loss Neg Hx     History   Social History  . Marital Status: Single    Spouse Name: N/A    Number of Children: N/A  . Years of Education: N/A   Occupational History  . Not on file.   Social History Main Topics  . Smoking status: Never Smoker   . Smokeless tobacco: Not on file  . Alcohol Use: No  . Drug Use: No  . Sexually Active: Not Currently   Other Topics Concern  . Not on file   Social History Narrative  . No narrative on file    Review of Systems:  All systems reviewed.  They are negative to the above  problem except as previously stated.  Vital Signs: BP 134/79  Pulse 80  Ht 5\' 5"  (1.651 m)  Wt 131 lb 12.8 oz (59.784 kg)  BMI 21.93 kg/m2  SpO2 97%  Physical Exam Patient is in NAD HEENT:  Normocephalic, atraumatic. EOMI, PERRLA.  Neck: JVP is normal.  No bruits.  Lungs: clear to auscultation. No rales no wheezes.  Heart: Regular rate and rhythm. Normal S1, S2. No S3.   No significant murmurs. PMI not displaced.  Abdomen:  Supple, nontender. Normal bowel sounds. No masses. No hepatomegaly.  Extremities:   Good distal pulses throughout. No lower extremity edema.  Musculoskeletal :moving all extremities.  Neuro:   alert and oriented x3.  CN II-XII grossly intact.  EKG  NSR Assessment and Plan: 1.  Dizziness.  Patient is not orthostatic again on evaluation  Encouraged her to stay hydrated  2.  SOB  Not clear what this represents.  Will check labs and schedule for echo  With chest heaviness rec a trial  of Zantac.  Continue acitvities as tolerated.

## 2013-04-30 NOTE — ED Provider Notes (Signed)
Medical screening examination/treatment/procedure(s) were performed by non-physician practitioner and as supervising physician I was immediately available for consultation/collaboration.   Laray Anger, DO 04/30/13 1453

## 2013-04-30 NOTE — Progress Notes (Signed)
Still dizzy.

## 2013-04-30 NOTE — Progress Notes (Signed)
+   dizzy

## 2013-05-10 ENCOUNTER — Ambulatory Visit (HOSPITAL_COMMUNITY): Payer: Medicaid Other | Attending: Cardiology

## 2013-05-10 DIAGNOSIS — R0989 Other specified symptoms and signs involving the circulatory and respiratory systems: Secondary | ICD-10-CM

## 2013-05-10 DIAGNOSIS — Z8249 Family history of ischemic heart disease and other diseases of the circulatory system: Secondary | ICD-10-CM | POA: Insufficient documentation

## 2013-05-10 DIAGNOSIS — R0602 Shortness of breath: Secondary | ICD-10-CM | POA: Insufficient documentation

## 2013-05-10 NOTE — Progress Notes (Signed)
Echocardiogram performed.  

## 2013-06-23 ENCOUNTER — Institutional Professional Consult (permissible substitution): Payer: Medicaid Other | Admitting: Cardiovascular Disease

## 2013-09-17 ENCOUNTER — Encounter (HOSPITAL_COMMUNITY): Payer: Self-pay | Admitting: Emergency Medicine

## 2013-09-17 ENCOUNTER — Emergency Department (HOSPITAL_COMMUNITY): Payer: Medicaid Other

## 2013-09-17 ENCOUNTER — Emergency Department (HOSPITAL_COMMUNITY)
Admission: EM | Admit: 2013-09-17 | Discharge: 2013-09-17 | Disposition: A | Discharge status: Home / Self Care | Payer: Self-pay | Attending: Emergency Medicine | Admitting: Emergency Medicine

## 2013-09-17 DIAGNOSIS — Z8679 Personal history of other diseases of the circulatory system: Secondary | ICD-10-CM | POA: Insufficient documentation

## 2013-09-17 DIAGNOSIS — R1013 Epigastric pain: Secondary | ICD-10-CM | POA: Insufficient documentation

## 2013-09-17 DIAGNOSIS — R110 Nausea: Secondary | ICD-10-CM | POA: Insufficient documentation

## 2013-09-17 DIAGNOSIS — Z3202 Encounter for pregnancy test, result negative: Secondary | ICD-10-CM | POA: Insufficient documentation

## 2013-09-17 LAB — COMPREHENSIVE METABOLIC PANEL
ALT: 22 U/L (ref 0–35)
AST: 29 U/L (ref 0–37)
Albumin: 3.5 g/dL (ref 3.5–5.2)
CO2: 21 mEq/L (ref 19–32)
Calcium: 8.5 mg/dL (ref 8.4–10.5)
Creatinine, Ser: 0.68 mg/dL (ref 0.50–1.10)
GFR calc non Af Amer: >90 mL/min (ref >90)
Sodium: 138 mEq/L (ref 135–145)
Total Protein: 6.9 g/dL (ref 6.0–8.3)

## 2013-09-17 LAB — CBC WITH DIFFERENTIAL/PLATELET
Basophils Absolute: 0 10*3/uL (ref 0.0–0.1)
Basophils Relative: 0 % (ref 0–1)
Eosinophils Relative: 2 % (ref 0–5)
Lymphocytes Relative: 45 % (ref 12–46)
MCHC: 34.1 g/dL (ref 30.0–36.0)
MCV: 84.5 fL (ref 78.0–100.0)
Platelets: 289 10*3/uL (ref 150–400)
RDW: 13.7 % (ref 11.5–15.5)
WBC: 6.6 10*3/uL (ref 4.0–10.5)

## 2013-09-17 LAB — URINALYSIS W MICROSCOPIC + REFLEX CULTURE
Glucose, UA: NEGATIVE mg/dL
Specific Gravity, Urine: >1.03 — ABNORMAL HIGH (ref 1.005–1.030)
pH: 6 (ref 5.0–8.0)

## 2013-09-17 MED ORDER — SUCRALFATE 1 G PO TABS: 1.0000 g | ORAL_TABLET | Freq: Four times a day (QID) | ORAL | Status: DC

## 2013-09-17 MED ORDER — ONDANSETRON HCL 4 MG/2ML IJ SOLN
4.0000 mg | Freq: Once | INTRAMUSCULAR | Status: AC
  Administered 2013-09-17: 4 mg via INTRAVENOUS
  Filled 2013-09-17: qty 2

## 2013-09-17 MED ORDER — GI COCKTAIL ~~LOC~~
30.0000 mL | Freq: Once | ORAL | Status: AC
  Administered 2013-09-17: 30 mL via ORAL
  Filled 2013-09-17: qty 30

## 2013-09-17 MED ORDER — RANITIDINE HCL 150 MG PO TABS: 150.0000 mg | ORAL_TABLET | Freq: Two times a day (BID) | ORAL | Status: DC

## 2013-09-17 MED ORDER — ACETAMINOPHEN 325 MG PO TABS
650.0000 mg | ORAL_TABLET | Freq: Once | ORAL | Status: AC
  Administered 2013-09-17: 650 mg via ORAL
  Filled 2013-09-17: qty 2

## 2013-09-17 NOTE — ED Provider Notes (Signed)
CSN: [id number]     Arrival date & time 09/17/13  5621 History   First MD Initiated Contact with Patient 09/17/13 [phone]     Chief Complaint  Patient presents with  . Abdominal Pain   (Consider location/radiation/quality/duration/timing/severity/associated sxs/prior Treatment) HPI Ann Taylor is a 32 y.o. female who presents to emergency department with complaint of abdominal pain. Patient states that her pain began 3 days ago. States pain is epigastric. It does not radiate. Describes pain as sharp. States that she does have associated nausea, denies vomiting or diarrhea. Patient states that she has pain that is worsened after eating and that improves with eating. States pain is also worse at night. Patient tried taking Pepto-Bismol once but did not have any relief of her pain. She denies any similar pain in the past. She denies any fever, chills, upper respiratory symptoms, malaise. She denies any recent travel.  Past Medical History  Diagnosis Date  . Arrhythmia   . VHQIONGE(952.8)    Past Surgical History  Procedure Laterality Date  . Cesarean section     Family History  Problem Relation Age of Onset  . Alcohol abuse Neg Hx   . Arthritis Neg Hx   . Asthma Neg Hx   . Birth defects Neg Hx   . Cancer Neg Hx   . COPD Neg Hx   . Depression Neg Hx   . Diabetes Neg Hx   . Drug abuse Neg Hx   . Early death Neg Hx   . Hearing loss Neg Hx   . Heart disease Neg Hx   . Hyperlipidemia Neg Hx   . Hypertension Neg Hx   . Kidney disease Neg Hx   . Learning disabilities Neg Hx   . Mental illness Neg Hx   . Mental retardation Neg Hx   . Miscarriages / Stillbirths Neg Hx   . Stroke Neg Hx   . Vision loss Neg Hx    History  Substance Use Topics  . Smoking status: Never Smoker   . Smokeless tobacco: Not on file  . Alcohol Use: No   OB History   Grav Para Term Preterm Abortions TAB SAB Ect Mult Living   3 2 2  0 0 0 0 0 0 2     Review of Systems  Constitutional: Negative for  fever and chills.  Respiratory: Negative for cough, chest tightness and shortness of breath.   Cardiovascular: Negative for chest pain, palpitations and leg swelling.  Gastrointestinal: Positive for nausea and abdominal pain. Negative for vomiting and diarrhea.  Genitourinary: Negative for dysuria, flank pain, vaginal bleeding, vaginal discharge, vaginal pain and pelvic pain.  Musculoskeletal: Negative for arthralgias, myalgias, neck pain and neck stiffness.  Skin: Negative for rash.  Neurological: Negative for dizziness, weakness and headaches.  All other systems reviewed and are negative.    Allergies  Shellfish allergy and Amoxicillin  Home Medications  No current outpatient prescriptions on file. BP 130/85  Pulse 100  Temp(Src) 98 F (36.7 C) (Oral)  Resp 16  SpO2 98% Physical Exam  Nursing note and vitals reviewed. Constitutional: She appears well-developed and well-nourished. No distress.  HENT:  Head: Normocephalic.  Eyes: Conjunctivae are normal.  Neck: Neck supple.  Cardiovascular: Normal rate, regular rhythm and normal heart sounds.   Pulmonary/Chest: Effort normal and breath sounds normal. No respiratory distress. She has no wheezes. She has no rales.  Abdominal: Soft. Bowel sounds are normal. She exhibits no distension. There is no tenderness. There is no  rebound.  No CVA tenderness  Musculoskeletal: She exhibits no edema.  Neurological: She is alert.  Skin: Skin is warm and dry.  Psychiatric: She has a normal mood and affect. Her behavior is normal.    ED Course  Procedures (including critical care time) Labs Review Labs Reviewed  CBC WITH DIFFERENTIAL - Abnormal; Notable for the following:    Neutrophils Relative % 41 (*)    Monocytes Relative 13 (*)    All other components within normal limits  COMPREHENSIVE METABOLIC PANEL - Abnormal; Notable for the following:    Glucose, Bld 101 (*)    Alkaline Phosphatase 31 (*)    All other components within  normal limits  URINALYSIS W MICROSCOPIC + REFLEX CULTURE - Abnormal; Notable for the following:    APPearance HAZY (*)    Specific Gravity, Urine >1.030 (*)    Hgb urine dipstick MODERATE (*)    Protein, ur 30 (*)    Urobilinogen, UA 2.0 (*)    Leukocytes, UA TRACE (*)    Squamous Epithelial / LPF FEW (*)    All other components within normal limits  LIPASE, BLOOD  POCT PREGNANCY, URINE   Imaging Review US Abdomen Complete  09/17/2013   CLINICAL DATA:  Abdominal pain.  EXAM: ULTRASOUND ABDOMEN COMPLETE  COMPARISON:  None.  FINDINGS: Gallbladder  No gallstones or wall thickening visualized. No sonographic Murphy sign noted.  Common bile duct  Diameter: 4 mm  Liver  No focal lesion identified. Within normal limits in parenchymal echogenicity.  IVC  No abnormality visualized.  Pancreas  Visualized portion unremarkable.  Spleen  Size and appearance within normal limits.  Right Kidney  Length: 10.5 cm. Echogenicity within normal limits. No mass or hydronephrosis visualized.  Left Kidney  Length: 9.8 cm. Echogenicity within normal limits. No mass or hydronephrosis visualized.  Abdominal aorta  No aneurysm visualized.  IMPRESSION: Negative abdominal ultrasound. No gallstones or other sonographic abnormality identified.   Electronically Signed   By: Myles Rosenthal M.D.   On: 09/17/2013 09:34    EKG Interpretation   None       MDM   1. Epigastric abdominal pain     Patient with epigastric abdominal pain for 3 days. She is nontender on examination. She has no vomiting or diarrhea. Her blood work and UA unremarkable. Her symptoms improved with GI cocktail in emergency department. Her nausea resolved with Zofran. Suspect GERD vs gastritis vs peptic ulcer disease. Pt has no risk factors for ulcers. Will start on zantac and carafate. Follow up with pcp.   Filed Vitals:   09/17/13 0645 09/17/13 0700 09/17/13 0730 09/17/13 0830  BP: 128/80 137/89 91/59 102/73  Pulse: 106 100 88 71  Temp:       TempSrc:      Resp:      SpO2: 100% 98% 100% 98%        Lottie Mussel, PA-C 09/17/13 [phone]

## 2013-09-17 NOTE — ED Notes (Signed)
Pt. Reports upper abdominal pain with nausea onset 3 days ago , denies emesis or diarrhea , no fever or chills.

## 2013-09-17 NOTE — ED Notes (Signed)
Patient transported to Ultrasound 

## 2013-09-17 NOTE — ED Notes (Signed)
Family at bedside. 

## 2013-09-17 NOTE — ED Notes (Signed)
Patient is alert and orientedx4.  Patient was explained discharge instructions and they understood them with no questions.   The patient's boyfriend,  Greggory Brandy, is taking the patient.

## 2013-09-18 NOTE — ED Provider Notes (Signed)
Medical screening examination/treatment/procedure(s) were performed by non-physician practitioner and as supervising physician I was immediately available for consultation/collaboration.    Sunnie Nielsen, MD 09/18/13 [phone]

## 2014-01-03 ENCOUNTER — Emergency Department (HOSPITAL_COMMUNITY)
Admission: EM | Admit: 2014-01-03 | Discharge: 2014-01-03 | Disposition: A | Discharge status: Home / Self Care | Payer: Medicaid Other | Attending: Emergency Medicine | Admitting: Emergency Medicine

## 2014-01-03 ENCOUNTER — Encounter (HOSPITAL_COMMUNITY): Payer: Self-pay | Admitting: Emergency Medicine

## 2014-01-03 DIAGNOSIS — Z8679 Personal history of other diseases of the circulatory system: Secondary | ICD-10-CM | POA: Insufficient documentation

## 2014-01-03 DIAGNOSIS — N390 Urinary tract infection, site not specified: Secondary | ICD-10-CM | POA: Insufficient documentation

## 2014-01-03 DIAGNOSIS — R51 Headache: Secondary | ICD-10-CM | POA: Insufficient documentation

## 2014-01-03 DIAGNOSIS — Z3202 Encounter for pregnancy test, result negative: Secondary | ICD-10-CM | POA: Insufficient documentation

## 2014-01-03 LAB — URINE MICROSCOPIC-ADD ON

## 2014-01-03 LAB — URINALYSIS, ROUTINE W REFLEX MICROSCOPIC
Glucose, UA: NEGATIVE mg/dL
KETONES UR: NEGATIVE mg/dL
NITRITE: NEGATIVE
PH: 6.5 (ref 5.0–8.0)
Protein, ur: 100 mg/dL — AB
SPECIFIC GRAVITY, URINE: 1.024 (ref 1.005–1.030)
Urobilinogen, UA: 2 mg/dL — ABNORMAL HIGH (ref 0.0–1.0)

## 2014-01-03 LAB — POCT PREGNANCY, URINE: Preg Test, Ur: NEGATIVE

## 2014-01-03 MED ORDER — CIPROFLOXACIN HCL 500 MG PO TABS: 500.0000 mg | ORAL_TABLET | Freq: Two times a day (BID) | ORAL | Status: DC

## 2014-01-03 NOTE — ED Notes (Signed)
PT states she has had burning with urination and urgency since yesterday. Hx of UTIs in past, per pt feels like same.

## 2014-01-03 NOTE — ED Provider Notes (Signed)
CSN: [id number]     Arrival date & time 01/03/14  1010 History   First MD Initiated Contact with Patient 01/03/14 1058     Chief Complaint  Patient presents with  . Dysuria     (Consider location/radiation/quality/duration/timing/severity/associated sxs/prior Treatment) HPI Comments: Ann Taylor is a 33 year-old female with a past medical history of headaches, presenting the Emergency Department with a chief complaint of dysuria since yesterday.  The patient reports an abrupt onset of discomfort after urination.  Denies UTI or recent antibiotic use in the past several months. LNMP: depo injection   Patient is a 33 y.o. female presenting with dysuria. The history is provided by the patient and medical records. No language interpreter was used.  Dysuria Pain quality:  Sharp and burning Pain severity:  Moderate Onset quality:  Sudden Duration:  2 days Timing:  Intermittent Chronicity:  New Recent urinary tract infections: no   Relieved by:  None tried Ineffective treatments:  None tried Urinary symptoms: frequent urination   Urinary symptoms: no discolored urine, no foul-smelling urine, no hematuria, no hesitancy and no bladder incontinence   Associated symptoms: abdominal pain   Associated symptoms: no fever, no flank pain, no genital lesions, no nausea, no vaginal discharge and no vomiting     Past Medical History  Diagnosis Date  . Arrhythmia   . [id number](478.2Headache(784.0)    Past Surgical History  Procedure Laterality Date  . Cesarean section     Family History  Problem Relation Age of Onset  . Alcohol abuse Neg Hx   . Arthritis Neg Hx   . Asthma Neg Hx   . Birth defects Neg Hx   . Cancer Neg Hx   . COPD Neg Hx   . Depression Neg Hx   . Diabetes Neg Hx   . Drug abuse Neg Hx   . Early death Neg Hx   . Hearing loss Neg Hx   . Heart disease Neg Hx   . Hyperlipidemia Neg Hx   . Hypertension Neg Hx   . Kidney disease Neg Hx   . Learning disabilities Neg Hx   . Mental  illness Neg Hx   . Mental retardation Neg Hx   . Miscarriages / Stillbirths Neg Hx   . Stroke Neg Hx   . Vision loss Neg Hx    History  Substance Use Topics  . Smoking status: Never Smoker   . Smokeless tobacco: Not on file  . Alcohol Use: No   OB History   Grav Para Term Preterm Abortions TAB SAB Ect Mult Living   3 2 2  0 0 0 0 0 0 2     Review of Systems  Constitutional: Negative for fever and chills.  Gastrointestinal: Positive for abdominal pain. Negative for nausea and vomiting.  Genitourinary: Positive for dysuria and urgency. Negative for flank pain, vaginal bleeding, vaginal discharge, vaginal pain and menstrual problem.  Musculoskeletal: Negative for back pain.      Allergies  Shellfish allergy and Amoxicillin  Home Medications   Current Outpatient Rx  Name  Route  Sig  Dispense  Refill  . ciprofloxacin (CIPRO) 500 MG tablet   Oral   Take 1 tablet (500 mg total) by mouth 2 (two) times daily.   10 tablet   0    BP 136/91  Pulse 110  Temp(Src) 98.2 F (36.8 C)  Resp 16  SpO2 98% Physical Exam  Nursing note and vitals reviewed. Constitutional: She is oriented to person, place,  and time. She appears well-developed and well-nourished. No distress.  Neck: Neck supple.  Pulmonary/Chest: Effort normal. No respiratory distress.  Abdominal: Soft. There is tenderness in the suprapubic area. There is no rigidity, no rebound, no guarding, no CVA tenderness, no tenderness at McBurney's point and negative Murphy's sign.  Musculoskeletal: Normal range of motion.  Neurological: She is oriented to person, place, and time.  Skin: Skin is warm and dry. She is not diaphoretic.  Psychiatric: She has a normal mood and affect. Her behavior is normal. Judgment and thought content normal.    ED Course  Procedures (including critical care time) Labs Review Labs Reviewed  URINALYSIS, ROUTINE W REFLEX MICROSCOPIC - Abnormal; Notable for the following:    APPearance CLOUDY  (*)    Hgb urine dipstick LARGE (*)    Bilirubin Urine SMALL (*)    Protein, ur 100 (*)    Urobilinogen, UA 2.0 (*)    Leukocytes, UA MODERATE (*)    All other components within normal limits  URINE MICROSCOPIC-ADD ON - Abnormal; Notable for the following:    Squamous Epithelial / LPF MANY (*)    Bacteria, UA MANY (*)    All other components within normal limits  URINE CULTURE  POCT PREGNANCY, URINE   Imaging Review No results found.  EKG Interpretation   None       MDM   Final diagnoses:  UTI (urinary tract infection)   Pt with a 2 day history of dysuria. No fever, chills, nausea, vomiting, CVA tenderness. UA ordered. UA shows leukocytes, large Hbg, and bacteria.  Will treat for UTI. Discussed lab results, and treatment plan with the patient. Return precautions given. Reports understanding and no other concerns at this time.  Patient is stable for discharge at this time.  Meds given in ED:  Medications - No data to display  New Prescriptions   CIPROFLOXACIN (CIPRO) 500 MG TABLET    Take 1 tablet (500 mg total) by mouth 2 (two) times daily.        Clabe Seal, PA-C 01/03/14 1243

## 2014-01-03 NOTE — Discharge Instructions (Signed)
Call for a follow up appointment with a Family or Primary Care Provider.  °Return if Symptoms worsen.   °Take medication as prescribed.  ° ° °Emergency Department Resource Guide °1) Find a Doctor and Pay Out of Pocket °Although you won't have to find out who is covered by your insurance plan, it is a good idea to ask around and get recommendations. You will then need to call the office and see if the doctor you have chosen will accept you as a new patient and what types of options they offer for patients who are self-pay. Some doctors offer discounts or will set up payment plans for their patients who do not have insurance, but you will need to ask so you aren't surprised when you get to your appointment. ° °2) Contact Your Local Health Department °Not all health departments have doctors that can see patients for sick visits, but many do, so it is worth a call to see if yours does. If you don't know where your local health department is, you can check in your phone book. The CDC also has a tool to help you locate your state's health department, and many state websites also have listings of all of their local health departments. ° °3) Find a Walk-in Clinic °If your illness is not likely to be very severe or complicated, you may want to try a walk in clinic. These are popping up all over the country in pharmacies, drugstores, and shopping centers. They're usually staffed by nurse practitioners or physician assistants that have been trained to treat common illnesses and complaints. They're usually fairly quick and inexpensive. However, if you have serious medical issues or chronic medical problems, these are probably not your best option. ° °No Primary Care Doctor: °- Call [facility] at  832-8000 - they can help you locate a primary care doctor that  accepts your insurance, provides certain services, etc. °- Physician Referral Service- 1-800-533-3463 ° °Chronic Pain Problems: °Organization         Address  Phone    Notes  °[facility]  (336) 297-2271 Patients need to be referred by their primary care doctor.  ° °Medication Assistance: °Organization         Address  Phone   Notes  °[place] [facility] [address] °[place], [place] [address] (336) 641-8030 --Must be a resident of [place] °-- Must have NO insurance coverage whatsoever (no Medicaid/ Medicare, etc.) °-- The pt. MUST have a primary care doctor that directs their care regularly and follows them in the community °  °[facility]  (866) 331-1348   °[employer]  (888) 892-1162   ° °Agencies that provide inexpensive medical care: °Organization         Address  Phone   Notes  °[facility]  (336) 832-8035   ° Internal Medicine    (336) 832-7272   °[facility] [address] °[place], [place] [address] (336) 832-4777   °[facility] [address], °[place] (336) 271-4999   °[facility]    (336) 373-0678   °[facility]    (336) 272-1050   °[facility] ° [address], [place] Phone:  (336) 832-4444, Fax:  (336) 832-4440 Hours of Operation:  9 am - 6 pm, M-F.  Also accepts Medicaid/Medicare and self-pay.  ° [facility] ° [address],    Phone: (336) 832-3150, Fax: (336) 832-3151. Hours of Operation:  8:30 am - 5:30 pm, M-F.  Also accepts Medicaid and self-pay.  °[facility] [place] [address], [place] Phone: (336) 878-6027   °[facility] [address], [place], [place] (336)723-1848, Ext. 123 Mondays & Thursdays: 7-9 AM.  First 15 patients are seen on a first come, first serve basis. °  ° °Medicaid-accepting [place] Providers: ° °Organization         Address  Phone   Notes  °[facility] [address], [place] (336) 641-2100 Also accepts self-pay patients.  °[facility]  [address], [place] ° (336) 856-9996   °[facility] [address], [place] (336) 288-8857   °[facility] [address] [place] [address], [place] (336) 299-7000   °Veita Bland [address], [place]  ° (336) 373-1557 Only accepts [place] Access Medicaid patients after they have their name applied to their card.  ° °Self-Pay (no insurance) in [place]: ° °Organization         Address  Phone   Notes  °Sickle Cell Patients, [facility] [address], [place] (336) 832-1970   °[facility] [address], [place] (336) 832-4400   °[facility] ° [address], [place] (336) 992-4800   °[facility]/Dr. Osei-Bonsu ° [address] [place] [address], [place] or [address], [place] (336) 841-8500 Phone number for both [place] and [place] locations is the same.  °[facility] [address], [place] (336) 299-0000   °[facility] [address] [place] [address], [place] or [address] (336) 852-7530 °(336) 878-2260   °[facility] [address], [place] (336) 350-1642, phone; (336) 294-5005, fax Sees patients 1st and 3rd Saturday of every month.  Must not qualify for public or private insurance (i.e. Medicaid, Medicare, [place] Health Choice, Veterans' Benefits) • Household income should be no more than 200% of the poverty level •The clinic cannot treat you if you are pregnant or think you are pregnant • Sexually transmitted diseases are not treated at the clinic.  ° ° °Dental Care: °Organization         Address  Phone  Notes  °[facility] [facility] [address], [place] (336) 641-6152 Accepts children up to age 21 who are enrolled in Medicaid or [place] Health Choice; pregnant women with a Medicaid card; and children who have  applied for Medicaid or [place] Health Choice, but were declined, whose parents can pay a reduced fee at time of service.  °[facility] [place]  [address], [place] (336) 641-7733 Accepts children up to age 21 who are enrolled in Medicaid or [place] Health Choice; pregnant women with a Medicaid card; and children who have applied for Medicaid or  Health Choice, but were declined, whose parents can pay a reduced fee at time of service.  °[facility] ° [address], [place] (336) 641-4533 Patients are seen by appointment only. Walk-ins are not accepted. [facility] will see patients 18 years of age and older. °Monday - Tuesday (8am-5pm) °Most Wednesdays (8:30-5pm) °$30 per visit, cash only  °[facility] ° [address], [place] (336) 641-4533 Patients are seen by appointment only. Walk-ins are not accepted. [facility] will see patients 18 years of age and older. °One Wednesday Evening (Monthly: Volunteer Based).  $30 per visit, cash only  °[facility]  (919) 537-3737 for adults; Children under age 4, call Graduate Pediatric Dentistry at (919) [phone]. Children aged 4-14, please call (919) 537-3737 to request a pediatric application. ° Dental services are provided in all areas of dental care including fillings, crowns and bridges, complete and partial dentures, implants, gum treatment, root canals, and extractions. Preventive care is also provided. Treatment is provided to both adults and children. °Patients are selected via a lottery and there is often a waiting list. °  °[facility] [address], °[place] ° (336) 763-8833 [email or web address] °  °[facility] [address], [place], [place] (336)723-1848, Ext. 123 Second and Fourth Thursday of each month, opens at 6:30 AM; Clinic ends at 9 AM.  Patients are seen on a first-come first-served basis, and a  limited number are seen during each clinic.  ° °[facility] ° [address], [place], [place] (336) 723-7904   Eligibility Requirements °You must have lived in [place], [place], or [place] counties for at least the last three months. °  You cannot be eligible for state or federal sponsored healthcare insurance, including Veterans Administration, Medicaid, or Medicare. °  You generally cannot be eligible for healthcare insurance through your employer.  °  How to apply: °Eligibility screenings are held every Tuesday and Wednesday afternoon from 1:00 pm until 4:00 pm. You do not need an appointment for the interview!  °[facility] [address], [place], [place] 336-631-2330   °[facility]  336-342-8273   °[facility]  336-703-3100   °[facility]  336-570-6415   ° °Behavioral Health Resources in the Community: °Intensive Outpatient Programs °Organization         Address  Phone  Notes  °[facility] [address], [place], [place] 336-878-6098   °[facility] [address], [place], [place] 336-832-9800   °[facility] [address], [place], [place] ° 336-882-2125   °[facility] [address],  °[place], [place] 1-800-853-5163 or 336-641-4981   °Substance Abuse Resources °Organization         Address  Phone  Notes  °[facility]  336-882-2125   °[facility]  336-784-9470   °[facility]  336-285-9073   °[facility]  336-845-3988   °Residential & Outpatient Substance Abuse Program  1-800-659-3381   °Psychological Services °Organization         Address  Phone  Notes  °[facility]  336- 832-9600   °[facility]  336- 378-7881   °[facility] [address], [place] 1-800-853-5163 or 336-641-4981   ° °Mobile Crisis Teams °Organization          Address  Phone  Notes  °[facility], Mobile Crisis Care Unit  1-877-626-1772   °Assertive °[facility] ° [address] [place], [place] 336-834-9664   °Sharon DeEsch [address] °[place] 336-554-5454   ° °Self-Help/Support Groups °Organization         Address  Phone             Notes  °[facility] - variety of   support groups  336- 373-1402 Call for more information  °Narcotics Anonymous (NA), [facility] [address], °[place]  2 meetings at this location  ° °Residential Treatment Programs °Organization         Address  Phone  Notes  °[facility] [address],    °[place]  1-866-801-8205   °[facility] ° [address], [place], [place] 704-293-8524   °[facility] [address], [place] 336-845-3988 Admissions: 8am-3pm M-F  °[facility] [address]    °[place], [place] 336-841-1104   °[facility] [address], [place], [place] 336-379-7146   °[facility] [address]  °[place], [place] 336-285-9073   °[facility] - Intensive Outpatient [address], [place], [place] 336-852-3033   °[facility] [address]  °[place], [place] 1-877-615-2722 or 336-784-9470   °[facility] [address] [place], [place] 336-227-7417 Accepts Medicaid  °[facility] [address]  °[place] 1-800-659-3381 Substance Abuse/Addiction Treatment  ° °[place] Behavioral Health Resources °Organization         Address  Phone  Notes  °[facility]  (888) 581-9988   °Julie Brannon, PhD [address] [place], [place]   (336) 349-5553 or (336) 951-0000   °[facility]   [address] °[place], [place] (336) 349-4454   °[facility] [address], [place], [place] (336) 342-8316 Insurance/Medicaid/sponsorship  through [facility]  °[facility] [address]                                    [place], [place] (336) 342-8316 Therapy/tele-psych/case  °[facility] [address]  ° [place], [place] (336) 349-2233    °Dr. Arfeen  (336) 349-4544   °[facility] [place]  [employer] [place] [facility] 1) [address], [place] °2) [address], [place] °3)  [address], [place] (336) 349-3220 °(336) 342-7768 ° °(336) 342-8140   °[place] [facility] (336) 342-1394 or (336) 342-3537 (After Hours)    ° °

## 2014-01-03 NOTE — Progress Notes (Signed)
[facility] provided pt with a list of primary care resources and [facility] information.  °

## 2014-01-03 NOTE — ED Provider Notes (Signed)
Medical screening examination/treatment/procedure(s) were performed by non-physician practitioner and as supervising physician I was immediately available for consultation/collaboration.  Ann MelterElliott L Sherylann Vangorden, MD 01/03/14 [phone]

## 2014-01-04 LAB — URINE CULTURE: Colony Count: 5000

## 2014-03-02 ENCOUNTER — Emergency Department (HOSPITAL_COMMUNITY)
Admission: EM | Admit: 2014-03-02 | Discharge: 2014-03-02 | Disposition: A | Discharge status: Home / Self Care | Payer: Medicaid Other | Attending: Emergency Medicine | Admitting: Emergency Medicine

## 2014-03-02 ENCOUNTER — Encounter (HOSPITAL_COMMUNITY): Payer: Self-pay | Admitting: Emergency Medicine

## 2014-03-02 DIAGNOSIS — X12XXXA Contact with other hot fluids, initial encounter: Secondary | ICD-10-CM | POA: Insufficient documentation

## 2014-03-02 DIAGNOSIS — Z8679 Personal history of other diseases of the circulatory system: Secondary | ICD-10-CM | POA: Insufficient documentation

## 2014-03-02 DIAGNOSIS — X131XXA Other contact with steam and other hot vapors, initial encounter: Secondary | ICD-10-CM

## 2014-03-02 DIAGNOSIS — T300 Burn of unspecified body region, unspecified degree: Secondary | ICD-10-CM

## 2014-03-02 DIAGNOSIS — Z880 Allergy status to penicillin: Secondary | ICD-10-CM | POA: Insufficient documentation

## 2014-03-02 DIAGNOSIS — Y93G9 Activity, other involving cooking and grilling: Secondary | ICD-10-CM | POA: Insufficient documentation

## 2014-03-02 DIAGNOSIS — T25229A Burn of second degree of unspecified foot, initial encounter: Secondary | ICD-10-CM | POA: Insufficient documentation

## 2014-03-02 DIAGNOSIS — Y929 Unspecified place or not applicable: Secondary | ICD-10-CM | POA: Insufficient documentation

## 2014-03-02 MED ORDER — SILVER SULFADIAZINE 1 % EX CREA: 1.0000 "application " | TOPICAL_CREAM | Freq: Every day | CUTANEOUS | Status: DC

## 2014-03-02 MED ORDER — HYDROCODONE-ACETAMINOPHEN 5-325 MG PO TABS: 1.0000 | ORAL_TABLET | ORAL | Status: DC | PRN

## 2014-03-02 MED ORDER — SILVER SULFADIAZINE 1 % EX CREA
TOPICAL_CREAM | Freq: Once | CUTANEOUS | Status: AC
  Administered 2014-03-02: 1 via TOPICAL
  Filled 2014-03-02: qty 85

## 2014-03-02 NOTE — ED Provider Notes (Signed)
Medical screening examination/treatment/procedure(s) were performed by non-physician practitioner and as supervising physician I was immediately available for consultation/collaboration.   EKG Interpretation None        Ketura Sirek S Pairlee Sawtell, MD 03/02/14 2157 

## 2014-03-02 NOTE — ED Provider Notes (Signed)
CSN: [id number]     Arrival date & time 03/02/14  1019 History   First MD Initiated Contact with Patient 03/02/14 1032    This chart was scribed for Marlon Peliffany Lanayah Gartley PA-C, a non-physician practitioner working with No att. providers found by Lewanda RifeAlexandra Hurtado, ED Scribe. This patient was seen in room TR10C/TR10C and the patient's care was started at 5:42 PM      Chief Complaint  Patient presents with  . Foot Burn     (Consider location/radiation/quality/duration/timing/severity/associated sxs/prior Treatment) The history is provided by the patient. No language interpreter was used.   HPI Comments: Lolita Riegeratalie D Lukes is a 33 y.o. female who presents to the Emergency Department complaining of constant right foot pain onset PTA after she poured hot water on affected foot while cooking. Describes pain as stinging pain, and moderate in severity on the dorsum of right foot. Reports associated blistering to dorsum of right foot. Reports pain is exacerbated touch and alleviating at rest. Denies associated other injuries, and drainage to site. Has tried blowing on the wounds for pain relief. NO hx of diabetes or difficulty healing.  Past Medical History  Diagnosis Date  . Arrhythmia   . NWGNFAOZ(308.6Headache(784.0)    Past Surgical History  Procedure Laterality Date  . Cesarean section     Family History  Problem Relation Age of Onset  . Alcohol abuse Neg Hx   . Arthritis Neg Hx   . Asthma Neg Hx   . Birth defects Neg Hx   . Cancer Neg Hx   . COPD Neg Hx   . Depression Neg Hx   . Diabetes Neg Hx   . Drug abuse Neg Hx   . Early death Neg Hx   . Hearing loss Neg Hx   . Heart disease Neg Hx   . Hyperlipidemia Neg Hx   . Hypertension Neg Hx   . Kidney disease Neg Hx   . Learning disabilities Neg Hx   . Mental illness Neg Hx   . Mental retardation Neg Hx   . Miscarriages / Stillbirths Neg Hx   . Stroke Neg Hx   . Vision loss Neg Hx    History  Substance Use Topics  . Smoking status: Never Smoker    . Smokeless tobacco: Not on file  . Alcohol Use: No   OB History   Grav Para Term Preterm Abortions TAB SAB Ect Mult Living   3 2 2  0 0 0 0 0 0 2     Review of Systems  Constitutional: Negative for fever.  Skin: Positive for wound.  Psychiatric/Behavioral: Negative for confusion.      Allergies  Shellfish allergy and Amoxicillin  Home Medications   Current Outpatient Rx  Name  Route  Sig  Dispense  Refill  . diphenhydrAMINE (BENADRYL) 25 mg capsule   Oral   Take 25 mg by mouth every 8 (eight) hours as needed (for hives).         . medroxyPROGESTERone (DEPO-PROVERA) 150 MG/ML injection   Intramuscular   Inject 150 mg into the muscle every 3 (three) months.         Marland Kitchen. HYDROcodone-acetaminophen (NORCO/VICODIN) 5-325 MG per tablet   Oral   Take 1-2 tablets by mouth every 4 (four) hours as needed.   20 tablet   0   . silver sulfADIAZINE (SILVADENE) 1 % cream   Topical   Apply 1 application topically daily.   50 g   0    BP 143/89  Pulse 104  Temp(Src) 98.1 F (36.7 C) (Oral)  Resp 15  Ht 5\' 5"  (1.651 m)  Wt 133 lb (60.328 kg)  BMI 22.13 kg/m2  SpO2 100% Physical Exam  Nursing note and vitals reviewed. Constitutional: She is oriented to person, place, and time. She appears well-developed and well-nourished. No distress.  HENT:  Head: Normocephalic and atraumatic.  Eyes: EOM are normal.  Neck: Neck supple. No tracheal deviation present.  Cardiovascular: Normal rate.   Pulmonary/Chest: Effort normal. No respiratory distress.  Musculoskeletal: Normal range of motion.  Neurological: She is alert and oriented to person, place, and time. She has normal reflexes.  Skin: Skin is warm and dry. Burn noted. There is erythema.  There are 2 areas of erythema on the dorsum of right foot: 3 cm by 3 cm and the other is 2 cm by 2.5 cm. Burns are partial thickness and not blistered. Surrounding erythema on boarders of burns. Cap refill less than 2 seconds. Normal  sensation. TTP of burns.   Psychiatric: She has a normal mood and affect. Her behavior is normal.    ED Course  Procedures (including critical care time) COORDINATION OF CARE:  Nursing notes reviewed. Vital signs reviewed. Initial pt interview and examination performed.   Filed Vitals:   03/02/14 1028 03/02/14 1128  BP: 137/81 143/89  Pulse: 106 104  Temp: 98.4 F (36.9 C) 98.1 F (36.7 C)  TempSrc:  Oral  Resp: 15   Height: 5\' 5"  (1.651 m)   Weight: 133 lb (60.328 kg)   SpO2: 100% 100%    5:42 PM-Discussed work up plan with pt at bedside, which includes  Orders Placed This Encounter  Procedures  . Post op boot    Standing Status: Standing     Number of Occurrences: 1     Standing Expiration Date:     Order Specific Question:  Laterality    Answer:  Right  . Post op boot    Standing Status: Standing     Number of Occurrences: 1     Standing Expiration Date:     Order Specific Question:  Laterality    Answer:  Right  . Pt agrees with plan.    Treatment plan initiated: Medications  silver sulfADIAZINE (SILVADENE) 1 % cream (1 application Topical Given 03/02/14 1118)     Initial diagnostic testing ordered.       Labs Review Labs Reviewed - No data to display Imaging Review No results found.   EKG Interpretation None      MDM   Final diagnoses:  Burn    32 y.o.Baeleigh Devincent Ruddock's evaluation in the Emergency Department is complete. It has been determined that no acute conditions requiring further emergency intervention are present at this time. The patient/guardian have been advised of the diagnosis and plan. We have discussed signs and symptoms that warrant return to the ED, such as changes or worsening in symptoms.  Vital signs are stable at discharge. Filed Vitals:   03/02/14 1128  BP: 143/89  Pulse: 104  Temp: 98.1 F (36.7 C)  Resp:     Patient/guardian has voiced understanding and agreed to follow-up with the PCP or specialist.   I  personally performed the services described in this documentation, which was scribed in my presence. The recorded information has been reviewed and is accurate.     Dorthula Matas, PA-C 03/02/14 1743

## 2014-03-02 NOTE — ED Notes (Signed)
Pt states she was watching her daughter and turned her head.  When she did she poured hot water on her R foot.  There are 2 blistered areas to the top of the foot.

## 2014-03-02 NOTE — Discharge Instructions (Signed)
Burn Care  Your skin is a natural barrier to infection. It is the largest organ of your body. Burns damage this natural protection. To help prevent infection, it is very important to follow your caregiver's instructions in the care of your burn.  Burns are classified as:   First degree. There is only redness of the skin (erythema). No scarring is expected.   Second degree. There is blistering of the skin. Scarring may occur with deeper burns.   Third degree. All layers of the skin are injured, and scarring is expected.  HOME CARE INSTRUCTIONS    Wash your hands well before changing your bandage.   Change your bandage as often as directed by your caregiver.   Remove the old bandage. If the bandage sticks, you may soak it off with cool, clean water.   Cleanse the burn thoroughly but gently with mild soap and water.   Pat the area dry with a clean, dry cloth.   Apply a thin layer of antibacterial cream to the burn.   Apply a clean bandage as instructed by your caregiver.   Keep the bandage as clean and dry as possible.   Elevate the affected area for the first 24 hours, then as instructed by your caregiver.   Only take over-the-counter or prescription medicines for pain, discomfort, or fever as directed by your caregiver.  SEEK IMMEDIATE MEDICAL CARE IF:    You develop excessive pain.   You develop redness, tenderness, swelling, or red streaks near the burn.   The burned area develops yellowish-white fluid (pus) or a bad smell.   You have a fever.  MAKE SURE YOU:    Understand these instructions.   Will watch your condition.   Will get help right away if you are not doing well or get worse.  Document Released: 11/11/2005 Document Revised: 02/03/2012 Document Reviewed: 04/03/2011  [employer] Patient Information 2014 [employer]  Second-Degree Burn  A second-degree burn affects the 2 outer layers of skin. The outer layer (epidermis) and the layer underneath it (dermis) are both burned. Another name  for this type of burn is a partial thickness burn. A second-degree burn may be called minor or major. This depends on the size of the burn. It also depends on what parts of the skin are burned. Minor burns may be treated with first aid. Major burns are a medical emergency.  A second-degree burn is worse than a first-degree burn, but not as bad as a third-degree burn. A first-degree burn affects only the epidermis. A third-degree burn goes through all the layers of skin. A second-degree burn usually heals in 3 to 4 weeks. A minor second-degree burn usually does not leave a scar.Deeper second-degree burns may lead to scarring of the skin or contractures over joints.Contractures are scars that form over joints and may lead to reduced mobility at those joints.  CAUSES   Heat (thermal) injury. This happens when skin comes in contact with something very hot. It could be a flame, a hot object, hot liquid, or steam. Most second-degree burns are thermal injuries.   Radiation. Sunlight is one type of radiation that can burn the skin. Another type of radiation is used to heat food. Radiation is also used to treat some diseases, such as cancer. All types of radiation can burn the skin. Sunlight usually causes a first-degree burn. Radiation used for heating food or treating a disease can cause a second-degree burn.   Electricity. Electrical burns can cause more damage   under the skin than on the surface. They should always be treated as major burns.   Chemicals. Many chemicals can burn the skin. The burn should be flushed with cool water and checked by an emergency caregiver.  SYMPTOMS  Symptoms of second-degree burns include:   Severe pain.   Extreme tenderness.   Deep redness.   Blistered skin.   Skin that has changed color.It might look blotchy, wet, or shiny.   Swelling.  TREATMENT  Some second-degree burns may need to be treated in a hospital. These include major burns, electrical burns, and chemical burns. Many  other second-degree burns can be treated with regular first aid, such as:   Cooling the burn. Use cool, germ-free (sterile) salt water. Place the burned area of skin into a tub of water, or cover the burned area with clean, wet towels.   Taking pain medicine.   Removing the dead skin from broken blisters. A trained caregiver may do this. Do not pop blisters.   Gently washing your skin with mild soap.   Covering the burned area with a cream.Silver sulfadiazine is a cream for burns. An antibiotic cream, such as bacitracin, may also be used to fight infection. Do not use other ointments or creams unless your caregiver says it is okay.   Protecting the burn with a sterile, non-sticky bandage.   Bandaging fingers and toes separately. This keeps them from sticking together.   Taking an antibiotic. This can help prevent infection.   Getting a tetanus shot.  HOME CARE INSTRUCTIONS  Medication   Take any medicine prescribed by your caregiver. Follow the directions carefully.   Ask your caregiver if you can take over-the-counter medicine to relieve pain and swelling. Do not give aspirin to children.   Make sure your caregiver knows about all other medicines you take.This includes over-the-counter medicines.  Burn care   You will need to change the bandage on your burn. You may need to do this 2 or 3 times each day.   Gently clean the burned area.   Put ointment on it.   Cover the burn with a sterile bandage.   For some deeper burns or burns that cover a large area, compression garments may be prescribed. These garments can help minimize scarring and protect your mobility.   Do not put butter or oil on your skin. Use only the cream prescribed by your caregiver.   Do not put ice on your burn.   Do not break blisters on your skin.   Keep the bandaged area dry. You might need to take a sponge bath for awhile.Ask your caregiver when you can take a shower or a tub bath again.   Do not scratch an itchy burn.  Your caregiver may give you medicine to relieve very bad itching.   Infection is a big danger after a second-degree burn. Tell your caregiver right away if you have signs of infection, such as:   Redness or changing color in the burned area.   Fluid leaking from the burn.   Swelling in the burn area.   A bad smell coming from the wound.  Follow-up   Keep all follow-up appointments.This is important. This is how your caregiver can tell if your treatment is working.   Protect your burn from sunlight.Use sunscreen whenever you go outside.Burned areas may be sensitive to the sun for up to 1 year. Exposure to the sun may also cause permanent darkening of scars.  SEEK MEDICAL CARE IF:     You have any questions about medicines.   You have any questions about your treatment.   You wonder if it is okay to do a particular activity.   You develop a fever of more than 100.5 F (38.1 C).  SEEK IMMEDIATE MEDICAL CARE IF:   You think your burn might be infected. It may change color, become red, leak fluid, swell, or smell bad.   You develop a fever of more than 102 F (38.9 C).  Document Released: 04/15/2011 Document Revised: 02/03/2012 Document Reviewed: 04/15/2011  [employer] Patient Information 2014 [employer]

## 2014-09-26 ENCOUNTER — Encounter (HOSPITAL_COMMUNITY): Payer: Self-pay | Admitting: Emergency Medicine

## 2014-10-23 ENCOUNTER — Emergency Department (HOSPITAL_COMMUNITY)
Admission: EM | Admit: 2014-10-23 | Discharge: 2014-10-23 | Disposition: A | Discharge status: Home / Self Care | Payer: Medicaid Other | Attending: Emergency Medicine | Admitting: Emergency Medicine

## 2014-10-23 ENCOUNTER — Encounter (HOSPITAL_COMMUNITY): Payer: Self-pay | Admitting: Emergency Medicine

## 2014-10-23 DIAGNOSIS — Z880 Allergy status to penicillin: Secondary | ICD-10-CM | POA: Insufficient documentation

## 2014-10-23 DIAGNOSIS — Z79899 Other long term (current) drug therapy: Secondary | ICD-10-CM | POA: Insufficient documentation

## 2014-10-23 DIAGNOSIS — Z8679 Personal history of other diseases of the circulatory system: Secondary | ICD-10-CM | POA: Insufficient documentation

## 2014-10-23 DIAGNOSIS — N390 Urinary tract infection, site not specified: Secondary | ICD-10-CM | POA: Insufficient documentation

## 2014-10-23 DIAGNOSIS — Z3202 Encounter for pregnancy test, result negative: Secondary | ICD-10-CM | POA: Insufficient documentation

## 2014-10-23 HISTORY — DX: Urinary tract infection, site not specified: N39.0

## 2014-10-23 LAB — URINALYSIS, ROUTINE W REFLEX MICROSCOPIC
Bilirubin Urine: NEGATIVE
Glucose, UA: NEGATIVE mg/dL
KETONES UR: NEGATIVE mg/dL
Nitrite: POSITIVE — AB
PROTEIN: 100 mg/dL — AB
Specific Gravity, Urine: 1.019 (ref 1.005–1.030)
Urobilinogen, UA: 1 mg/dL (ref 0.0–1.0)
pH: 7 (ref 5.0–8.0)

## 2014-10-23 LAB — URINE MICROSCOPIC-ADD ON

## 2014-10-23 LAB — PREGNANCY, URINE: Preg Test, Ur: NEGATIVE

## 2014-10-23 MED ORDER — PHENAZOPYRIDINE HCL 200 MG PO TABS: 200.0000 mg | ORAL_TABLET | Freq: Three times a day (TID) | ORAL | Status: DC

## 2014-10-23 MED ORDER — CIPROFLOXACIN HCL 500 MG PO TABS: 500.0000 mg | ORAL_TABLET | Freq: Two times a day (BID) | ORAL | Status: DC

## 2014-10-23 NOTE — ED Notes (Signed)
Pt c/o dysuria x 1 wk.  States she has hx of several UTIs.  Denies NVD.

## 2014-10-23 NOTE — Discharge Instructions (Signed)

## 2014-10-23 NOTE — ED Provider Notes (Signed)
CSN: [id number]     Arrival date & time 10/23/14  1027 History   First MD Initiated Contact with Patient 10/23/14 1127     Chief Complaint  Patient presents with  . Dysuria     (Consider location/radiation/quality/duration/timing/severity/associated sxs/prior Treatment) HPI Comments: Patient presents with a one-week history of burning on urination. She's had a history of prior UTIs and says that this felt similar to her prior UTIs. She denies abdominal pain. She's been using AZO with some improvement of symptoms. She denies any nausea or vomiting. She denies he fevers or chills. She denies any vaginal bleeding or discharge.  Patient is a 33 y.o. female presenting with dysuria.  Dysuria Associated symptoms: no abdominal pain, no fever, no flank pain, no nausea, no vaginal discharge and no vomiting     Past Medical History  Diagnosis Date  . Arrhythmia   . Headache(784.0)   . UTI (lower urinary tract infection)    Past Surgical History  Procedure Laterality Date  . Cesarean section     Family History  Problem Relation Age of Onset  . Alcohol abuse Neg Hx   . Arthritis Neg Hx   . Asthma Neg Hx   . Birth defects Neg Hx   . Cancer Neg Hx   . COPD Neg Hx   . Depression Neg Hx   . Diabetes Neg Hx   . Drug abuse Neg Hx   . Early death Neg Hx   . Hearing loss Neg Hx   . Heart disease Neg Hx   . Hyperlipidemia Neg Hx   . Hypertension Neg Hx   . Kidney disease Neg Hx   . Learning disabilities Neg Hx   . Mental illness Neg Hx   . Mental retardation Neg Hx   . Miscarriages / Stillbirths Neg Hx   . Stroke Neg Hx   . Vision loss Neg Hx    History  Substance Use Topics  . Smoking status: Never Smoker   . Smokeless tobacco: Not on file  . Alcohol Use: No   OB History    Gravida Para Term Preterm AB TAB SAB Ectopic Multiple Living   3 2 2  0 0 0 0 0 0 2     Review of Systems  Constitutional: Negative for fever, chills, diaphoresis and fatigue.  HENT: Negative for  congestion, rhinorrhea and sneezing.   Eyes: Negative.   Respiratory: Negative for cough, chest tightness and shortness of breath.   Cardiovascular: Negative for chest pain and leg swelling.  Gastrointestinal: Negative for nausea, vomiting, abdominal pain, diarrhea and blood in stool.  Genitourinary: Positive for dysuria. Negative for frequency, hematuria, flank pain, vaginal bleeding, vaginal discharge and difficulty urinating.  Musculoskeletal: Negative for back pain and arthralgias.  Skin: Negative for rash.  Neurological: Negative for dizziness, speech difficulty, weakness, numbness and headaches.      Allergies  Shellfish allergy and Amoxicillin  Home Medications   Prior to Admission medications   Medication Sig Start Date End Date Taking? Authorizing Provider  diphenhydrAMINE (BENADRYL) 25 mg capsule Take 25 mg by mouth every 8 (eight) hours as needed (for hives).    Historical Provider, MD  HYDROcodone-acetaminophen (NORCO/VICODIN) 5-325 MG per tablet Take 1-2 tablets by mouth every 4 (four) hours as needed. 03/02/14   Tiffany Irine SealG Greene, PA-C  medroxyPROGESTERone (DEPO-PROVERA) 150 MG/ML injection Inject 150 mg into the muscle every 3 (three) months.    Historical Provider, MD  silver sulfADIAZINE (SILVADENE) 1 % cream Apply 1 application  topically daily. 03/02/14   Tiffany Irine SealG Greene, PA-C   BP 124/83 mmHg  Pulse 94  Temp(Src) 98.7 F (37.1 C) (Oral)  Resp 18  SpO2 98% Physical Exam  Constitutional: She is oriented to person, place, and time. She appears well-developed and well-nourished.  HENT:  Head: Normocephalic and atraumatic.  Eyes: Pupils are equal, round, and reactive to light.  Neck: Normal range of motion. Neck supple.  Cardiovascular: Normal rate, regular rhythm and normal heart sounds.   Pulmonary/Chest: Effort normal and breath sounds normal. No respiratory distress. She has no wheezes. She has no rales. She exhibits no tenderness.  Abdominal: Soft. Bowel sounds  are normal. There is no tenderness. There is no rebound and no guarding.  Musculoskeletal: Normal range of motion. She exhibits no edema.  Lymphadenopathy:    She has no cervical adenopathy.  Neurological: She is alert and oriented to person, place, and time.  Skin: Skin is warm and dry. No rash noted.  Psychiatric: She has a normal mood and affect.    ED Course  Procedures (including critical care time) Labs Review Labs Reviewed  URINALYSIS, ROUTINE W REFLEX MICROSCOPIC - Abnormal; Notable for the following:    Color, Urine AMBER (*)    APPearance TURBID (*)    Hgb urine dipstick MODERATE (*)    Protein, ur 100 (*)    Nitrite POSITIVE (*)    Leukocytes, UA LARGE (*)    All other components within normal limits  URINE MICROSCOPIC-ADD ON - Abnormal; Notable for the following:    Bacteria, UA MANY (*)    All other components within normal limits  PREGNANCY, URINE    Imaging Review No results found.   EKG Interpretation None      MDM   Final diagnoses:  UTI (lower urinary tract infection)    Patient started on Cipro. She is encouraged to follow-up with primary care provider return here as needed for ongoing or worsening symptoms.    Rolan BuccoMelanie Earnestene Angello, MD 10/23/14 [phone]

## 2015-02-22 ENCOUNTER — Encounter (HOSPITAL_COMMUNITY): Payer: Self-pay | Admitting: *Deleted

## 2015-02-22 ENCOUNTER — Emergency Department (HOSPITAL_COMMUNITY)
Admission: EM | Admit: 2015-02-22 | Discharge: 2015-02-22 | Disposition: A | Discharge status: Home / Self Care | Payer: Medicaid Other | Attending: Emergency Medicine | Admitting: Emergency Medicine

## 2015-02-22 DIAGNOSIS — H109 Unspecified conjunctivitis: Secondary | ICD-10-CM | POA: Insufficient documentation

## 2015-02-22 DIAGNOSIS — Z880 Allergy status to penicillin: Secondary | ICD-10-CM | POA: Insufficient documentation

## 2015-02-22 DIAGNOSIS — Z79899 Other long term (current) drug therapy: Secondary | ICD-10-CM | POA: Insufficient documentation

## 2015-02-22 DIAGNOSIS — Z87440 Personal history of urinary (tract) infections: Secondary | ICD-10-CM | POA: Insufficient documentation

## 2015-02-22 DIAGNOSIS — Z8679 Personal history of other diseases of the circulatory system: Secondary | ICD-10-CM | POA: Insufficient documentation

## 2015-02-22 MED ORDER — TETRACAINE HCL 0.5 % OP SOLN
1.0000 [drp] | Freq: Once | OPHTHALMIC | Status: AC
  Administered 2015-02-22: 1 [drp] via OPHTHALMIC
  Filled 2015-02-22: qty 2

## 2015-02-22 MED ORDER — POLYMYXIN B-TRIMETHOPRIM 10000-0.1 UNIT/ML-% OP SOLN: 1.0000 [drp] | OPHTHALMIC | Status: DC

## 2015-02-22 MED ORDER — FLUORESCEIN SODIUM 1 MG OP STRP
1.0000 | ORAL_STRIP | Freq: Once | OPHTHALMIC | Status: AC
  Administered 2015-02-22: 1 via OPHTHALMIC
  Filled 2015-02-22: qty 1

## 2015-02-22 NOTE — ED Provider Notes (Signed)
CSN: [id number]     Arrival date & time 02/22/15  1255 History  This chart was scribed for non-physician practitioner, Emilia BeckKaitlyn Rainy Rothman, working with Blane OharaJoshua Zavitz, MD by Richarda Overlieichard Holland, ED Scribe. This patient was seen in room TR04C/TR04C and the patient's care was started at 2:08 PM.    Chief Complaint  Patient presents with  . Eye Pain   The history is provided by the patient. No language interpreter was used.   HPI Comments: Ann Taylor is a 34 y.o. female with a history of arrythmia, HA and UTI who presents to the Emergency Department complaining of right eye pain for the last 2 days. Pt reports associated right eye watering, redness and HA as symptoms. She states that she has not worn her contact lenses the last 3 days. Pt states she is unsure if she scratched her eye. She reports no alleviating or exacerbating factors at this time.   Past Medical History  Diagnosis Date  . Arrhythmia   . Headache(784.0)   . UTI (lower urinary tract infection)    Past Surgical History  Procedure Laterality Date  . Cesarean section     Family History  Problem Relation Age of Onset  . Alcohol abuse Neg Hx   . Arthritis Neg Hx   . Asthma Neg Hx   . Birth defects Neg Hx   . Cancer Neg Hx   . COPD Neg Hx   . Depression Neg Hx   . Diabetes Neg Hx   . Drug abuse Neg Hx   . Early death Neg Hx   . Hearing loss Neg Hx   . Heart disease Neg Hx   . Hyperlipidemia Neg Hx   . Hypertension Neg Hx   . Kidney disease Neg Hx   . Learning disabilities Neg Hx   . Mental illness Neg Hx   . Mental retardation Neg Hx   . Miscarriages / Stillbirths Neg Hx   . Stroke Neg Hx   . Vision loss Neg Hx    History  Substance Use Topics  . Smoking status: Never Smoker   . Smokeless tobacco: Not on file  . Alcohol Use: No   OB History    Gravida Para Term Preterm AB TAB SAB Ectopic Multiple Living   3 2 2  0 0 0 0 0 0 2     Review of Systems  Eyes: Positive for pain and redness.  Neurological:  Positive for headaches.  All other systems reviewed and are negative.     Allergies  Shellfish allergy and Amoxicillin  Home Medications   Prior to Admission medications   Medication Sig Start Date End Date Taking? Authorizing Provider  ciprofloxacin (CIPRO) 500 MG tablet Take 1 tablet (500 mg total) by mouth 2 (two) times daily. One po bid x 7 days 10/23/14   Rolan BuccoMelanie Belfi, MD  diphenhydrAMINE (BENADRYL) 25 mg capsule Take 25 mg by mouth every 8 (eight) hours as needed (for hives).    Historical Provider, MD  HYDROcodone-acetaminophen (NORCO/VICODIN) 5-325 MG per tablet Take 1-2 tablets by mouth every 4 (four) hours as needed. 03/02/14   Tiffany Neva SeatGreene, PA-C  medroxyPROGESTERone (DEPO-PROVERA) 150 MG/ML injection Inject 150 mg into the muscle every 3 (three) months.    Historical Provider, MD  phenazopyridine (PYRIDIUM) 200 MG tablet Take 1 tablet (200 mg total) by mouth 3 (three) times daily. 10/23/14   Rolan BuccoMelanie Belfi, MD  silver sulfADIAZINE (SILVADENE) 1 % cream Apply 1 application topically daily. 03/02/14   Tiffany  Greene, PA-C   BP 128/90 mmHg  Pulse 100  Temp(Src) 98.3 F (36.8 C)  Resp 18  Ht  (1.651 m)  Wt 156 lb (70.761 kg)  BMI 25.96 kg/m2  SpO2 97% Physical Exam  Constitutional: She is oriented to person, place, and time. She appears well-developed and well-nourished.  HENT:  Head: Normocephalic and atraumatic.  Eyes: EOM are normal. Pupils are equal, round, and reactive to light. Right eye exhibits no discharge. Left eye exhibits no discharge.  Right conjunctival injection. No drainage. No corneal abrasion or foreign body noted.   Neck: Neck supple. No tracheal deviation present.  Cardiovascular: Normal rate.   Pulmonary/Chest: Effort normal. No respiratory distress.  Abdominal: She exhibits no distension.  Neurological: She is alert and oriented to person, place, and time.  Skin: Skin is warm and dry.  Psychiatric: She has a normal mood and affect. Her behavior  is normal.  Nursing note and vitals reviewed.   ED Course  Procedures   DIAGNOSTIC STUDIES: Oxygen Saturation is 97% on RA, normal by my interpretation.    COORDINATION OF CARE: 2:16 PM Discussed treatment plan with pt at bedside and pt agreed to plan.   Labs Review Labs Reviewed - No data to display  Imaging Review No results found.   EKG Interpretation None      MDM   Final diagnoses:  Conjunctivitis of right eye    No change in vision. Patient will have polytrim drops for conjunctivitis. Fluorescin dye exam unremarkable.   I personally performed the services described in this documentation, which was scribed in my presence. The recorded information has been reviewed and is accurate.     Emilia Beck, PA-C 02/22/15 1420  Blane Ohara, MD 02/22/15 1620

## 2015-02-22 NOTE — ED Notes (Signed)
Pt states that she has had rt eye irritation. Pt states that this has been ongoing for 2 days. Pt noted to have reddness and pain. Pt states that it feels like she has a scratch.

## 2015-02-22 NOTE — Discharge Instructions (Signed)
Use polytrim drops as directed until symptoms resolve. Refer to attached documents for more information.  °

## 2015-03-23 ENCOUNTER — Encounter (HOSPITAL_COMMUNITY): Payer: Self-pay | Admitting: Emergency Medicine

## 2015-03-23 ENCOUNTER — Emergency Department (HOSPITAL_COMMUNITY): Payer: Medicaid Other

## 2015-03-23 ENCOUNTER — Emergency Department (HOSPITAL_COMMUNITY)
Admission: EM | Admit: 2015-03-23 | Discharge: 2015-03-23 | Disposition: A | Discharge status: Home / Self Care | Payer: Medicaid Other | Attending: Emergency Medicine | Admitting: Emergency Medicine

## 2015-03-23 DIAGNOSIS — M722 Plantar fascial fibromatosis: Secondary | ICD-10-CM | POA: Insufficient documentation

## 2015-03-23 DIAGNOSIS — Z793 Long term (current) use of hormonal contraceptives: Secondary | ICD-10-CM | POA: Insufficient documentation

## 2015-03-23 DIAGNOSIS — Z79899 Other long term (current) drug therapy: Secondary | ICD-10-CM | POA: Insufficient documentation

## 2015-03-23 DIAGNOSIS — Z87440 Personal history of urinary (tract) infections: Secondary | ICD-10-CM | POA: Insufficient documentation

## 2015-03-23 DIAGNOSIS — Z792 Long term (current) use of antibiotics: Secondary | ICD-10-CM | POA: Insufficient documentation

## 2015-03-23 DIAGNOSIS — Z880 Allergy status to penicillin: Secondary | ICD-10-CM | POA: Insufficient documentation

## 2015-03-23 NOTE — Discharge Instructions (Signed)
Plantar Fasciitis  Plantar fasciitis is a common condition that causes foot pain. It is soreness (inflammation) of the band of tough fibrous tissue on the bottom of the foot that runs from the heel bone (calcaneus) to the ball of the foot. The cause of this soreness may be from excessive standing, poor fitting shoes, running on hard surfaces, being overweight, having an abnormal walk, or overuse (this is common in runners) of the painful foot or feet. It is also common in aerobic exercise dancers and ballet dancers.  SYMPTOMS   Most people with plantar fasciitis complain of:   Severe pain in the morning on the bottom of their foot especially when taking the first steps out of bed. This pain recedes after a few minutes of walking.   Severe pain is experienced also during walking following a long period of inactivity.   Pain is worse when walking barefoot or up stairs  DIAGNOSIS    Your caregiver will diagnose this condition by examining and feeling your foot.   Special tests such as X-rays of your foot, are usually not needed.  PREVENTION    Consult a sports medicine professional before beginning a new exercise program.   Walking programs offer a good workout. With walking there is a lower chance of overuse injuries common to runners. There is less impact and less jarring of the joints.   Begin all new exercise programs slowly. If problems or pain develop, decrease the amount of time or distance until you are at a comfortable level.   Wear good shoes and replace them regularly.   Stretch your foot and the heel cords at the back of the ankle (Achilles tendon) both before and after exercise.   Run or exercise on even surfaces that are not hard. For example, asphalt is better than pavement.   Do not run barefoot on hard surfaces.   If using a treadmill, vary the incline.   Do not continue to workout if you have foot or joint problems. Seek professional help if they do not improve.  HOME CARE INSTRUCTIONS     Avoid activities that cause you pain until you recover.   Use ice or cold packs on the problem or painful areas after working out.   Only take over-the-counter or prescription medicines for pain, discomfort, or fever as directed by your caregiver.   Soft shoe inserts or athletic shoes with air or gel sole cushions may be helpful.   If problems continue or become more severe, consult a sports medicine caregiver or your own health care provider. Cortisone is a potent anti-inflammatory medication that may be injected into the painful area. You can discuss this treatment with your caregiver.  MAKE SURE YOU:    Understand these instructions.   Will watch your condition.   Will get help right away if you are not doing well or get worse.  Document Released: 08/06/2001 Document Revised: 02/03/2012 Document Reviewed: 10/05/2008  [employer] Patient Information 2015 [employer]. This information is not intended to replace advice given to you by your health care provider. Make sure you discuss any questions you have with your health care provider.

## 2015-03-23 NOTE — ED Notes (Signed)
Pt sts right ankle pain x 1 month worse after walking on foot

## 2015-03-23 NOTE — ED Provider Notes (Signed)
CSN: [id number]     Arrival date & time 03/23/15  1322 History  This chart was scribed for non-physician practitioner, Langston MaskerKaren Meylin Stenzel, PA-C, working with Rolan BuccoMelanie Belfi, MD, by Ronney LionSuzanne Le, ED Scribe. This patient was seen in room TR10C/TR10C and the patient's care was started at 2:36 PM.    Chief Complaint  Patient presents with  . Ankle Pain   Patient is a 34 y.o. female presenting with ankle pain. The history is provided by the patient. No language interpreter was used.  Ankle Pain Location:  Ankle Injury: no   Ankle location:  R ankle Pain details:    Radiates to:  Does not radiate   Severity:  Moderate   Onset quality:  Gradual   Duration:  1 month   Timing:  Constant   Progression:  Worsening Chronicity:  New Dislocation: no   Foreign body present:  No foreign bodies Prior injury to area:  No Relieved by:  Nothing Worsened by:  Nothing tried Ineffective treatments:  None tried Associated symptoms: swelling   Risk factors: no concern for non-accidental trauma and no obesity      HPI Comments:  Ann Taylor is a 34 y.o. female who presents to the Emergency Department complaining of constant, gradually worsening right ankle pain that began 1 month ago. She states she can feel "popping" in her ankle when walking. She denies any known trauma or injury. The pain is worse when she first wakes up in the morning. 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 Patient is otherwise healthy, with no chronic medical conditions. She denies smoking, EtOH consumption, or any illicit drug use.   Past Medical History  Diagnosis Date  . Arrhythmia   . Headache(784.0)   . UTI (lower urinary tract infection)    Past Surgical History  Procedure Laterality Date  . Cesarean section     Family History  Problem Relation Age of Onset  . Alcohol abuse Neg Hx   . Arthritis Neg Hx   . Asthma Neg Hx   . Birth defects  Neg Hx   . Cancer Neg Hx   . COPD Neg Hx   . Depression Neg Hx   . Diabetes Neg Hx   . Drug abuse Neg Hx   . Early death Neg Hx   . Hearing loss Neg Hx   . Heart disease Neg Hx   . Hyperlipidemia Neg Hx   . Hypertension Neg Hx   . Kidney disease Neg Hx   . Learning disabilities Neg Hx   . Mental illness Neg Hx   . Mental retardation Neg Hx   . Miscarriages / Stillbirths Neg Hx   . Stroke Neg Hx   . Vision loss Neg Hx    History  Substance Use Topics  . Smoking status: Never Smoker   . Smokeless tobacco: Not on file  . Alcohol Use: No   OB History    Gravida Para Term Preterm AB TAB SAB Ectopic Multiple Living   3 2 2  0 0 0 0 0 0 2     Review of Systems  Musculoskeletal: Positive for arthralgias.  All other systems reviewed and are negative.   Allergies  Shellfish allergy and Amoxicillin  Home Medications   Prior to Admission medications   Medication Sig Start Date End Date Taking? Authorizing Provider  ciprofloxacin (CIPRO) 500 MG tablet Take 1 tablet (  500 mg total) by mouth 2 (two) times daily. One po bid x 7 days 10/23/14   Rolan Bucco, MD  diphenhydrAMINE (BENADRYL) 25 mg capsule Take 25 mg by mouth every 8 (eight) hours as needed (for hives).    Historical Provider, MD  HYDROcodone-acetaminophen (NORCO/VICODIN) 5-325 MG per tablet Take 1-2 tablets by mouth every 4 (four) hours as needed. 03/02/14   Tiffany Neva Seat, PA-C  medroxyPROGESTERone (DEPO-PROVERA) 150 MG/ML injection Inject 150 mg into the muscle every 3 (three) months.    Historical Provider, MD  phenazopyridine (PYRIDIUM) 200 MG tablet Take 1 tablet (200 mg total) by mouth 3 (three) times daily. 10/23/14   Rolan Bucco, MD  silver sulfADIAZINE (SILVADENE) 1 % cream Apply 1 application topically daily. 03/02/14   Marlon Pel, PA-C  trimethoprim-polymyxin b (POLYTRIM) ophthalmic solution Place 1 drop into the right eye every 4 (four) hours. 02/22/15   Kaitlyn Szekalski, PA-C   BP 136/84 mmHg  Pulse 115   Temp(Src) 98.7 F (37.1 C) (Oral)  Resp 15  SpO2 96% Physical Exam  Constitutional: She is oriented to person, place, and time. She appears well-developed and well-nourished. No distress.  HENT:  Head: Normocephalic and atraumatic.  Eyes: Conjunctivae and EOM are normal.  Neck: Neck supple. No tracheal deviation present.  Cardiovascular: Normal rate.   Pulmonary/Chest: Effort normal. No respiratory distress.  Musculoskeletal: Normal range of motion. She exhibits tenderness. She exhibits no edema.  Tender mid foot to plantar area of foot. No swelling. Neurovascular and neurosensory are intact.  Neurological: She is alert and oriented to person, place, and time.  Skin: Skin is warm and dry.  Psychiatric: She has a normal mood and affect. Her behavior is normal.  Nursing note and vitals reviewed.   ED Course  Procedures (including critical care time)  DIAGNOSTIC STUDIES: Oxygen Saturation is 96% on RA, normal by my interpretation.    COORDINATION OF CARE: 2:41 PM - Discussed treatment plan with pt at bedside which includes orthotic shoe given here, and ortho referral, and pt agreed to plan.   Imaging Review Dg Ankle Complete Right  03/23/2015   CLINICAL DATA:  RIGHT anterior and lateral ankle pain for several days. Popping. No injury.  EXAM: RIGHT ANKLE - COMPLETE 3+ VIEW  COMPARISON:  None.  FINDINGS: There is no evidence of fracture, dislocation, or joint effusion. There is no evidence of arthropathy or other focal bone abnormality. Soft tissues are unremarkable.  IMPRESSION: Negative.   Electronically Signed   By: Andreas Newport M.D.   On: 03/23/2015 14:37   Dg Foot Complete Right  03/23/2015   CLINICAL DATA:  Several day history of pain.  No recent trauma.  EXAM: RIGHT FOOT COMPLETE - 3+ VIEW  COMPARISON:  None.  FINDINGS: Frontal, oblique, and lateral views obtained. There is no fracture or dislocation. Joint spaces appear intact. No erosive change. There is a minimal posterior  calcaneal spur.  IMPRESSION: Minimal posterior calcaneal spur. No fracture or dislocation. No appreciable arthropathic change.   Electronically Signed   By: Bretta Bang III M.D.   On: 03/23/2015 14:37    MDM   Final diagnoses:  Plantar fasciitis of right foot     I personally performed the services in this documentation, which was scribed in my presence.  The recorded information has been reviewed and considered.   Barnet Pall. Post op shoe Ace wrap oow x 3 days  Elson Areas, PA-C 03/23/15 1534  Rolan Bucco, MD 03/23/15 [phone]

## 2015-03-23 NOTE — ED Notes (Signed)
Declined W/C at D/C and was escorted to lobby by RN. 

## 2015-05-24 ENCOUNTER — Emergency Department (HOSPITAL_COMMUNITY)
Admission: EM | Admit: 2015-05-24 | Discharge: 2015-05-24 | Disposition: A | Discharge status: Home / Self Care | Payer: Medicaid Other | Attending: Emergency Medicine | Admitting: Emergency Medicine

## 2015-05-24 ENCOUNTER — Encounter (HOSPITAL_COMMUNITY): Payer: Self-pay | Admitting: Emergency Medicine

## 2015-05-24 DIAGNOSIS — M545 Low back pain, unspecified: Secondary | ICD-10-CM

## 2015-05-24 DIAGNOSIS — Z87440 Personal history of urinary (tract) infections: Secondary | ICD-10-CM | POA: Insufficient documentation

## 2015-05-24 DIAGNOSIS — Z880 Allergy status to penicillin: Secondary | ICD-10-CM | POA: Insufficient documentation

## 2015-05-24 DIAGNOSIS — Z79899 Other long term (current) drug therapy: Secondary | ICD-10-CM | POA: Insufficient documentation

## 2015-05-24 DIAGNOSIS — B9689 Other specified bacterial agents as the cause of diseases classified elsewhere: Secondary | ICD-10-CM

## 2015-05-24 DIAGNOSIS — Z3202 Encounter for pregnancy test, result negative: Secondary | ICD-10-CM | POA: Insufficient documentation

## 2015-05-24 DIAGNOSIS — N760 Acute vaginitis: Secondary | ICD-10-CM | POA: Insufficient documentation

## 2015-05-24 LAB — URINALYSIS, ROUTINE W REFLEX MICROSCOPIC
Bilirubin Urine: NEGATIVE
GLUCOSE, UA: NEGATIVE mg/dL
Ketones, ur: 15 mg/dL — AB
Leukocytes, UA: NEGATIVE
Nitrite: NEGATIVE
PROTEIN: NEGATIVE mg/dL
Specific Gravity, Urine: 1.013 (ref 1.005–1.030)
UROBILINOGEN UA: 1 mg/dL (ref 0.0–1.0)
pH: 7 (ref 5.0–8.0)

## 2015-05-24 LAB — CBC WITH DIFFERENTIAL/PLATELET
BASOS ABS: 0 10*3/uL (ref 0.0–0.1)
Basophils Relative: 0 % (ref 0–1)
Eosinophils Absolute: 0.1 10*3/uL (ref 0.0–0.7)
Eosinophils Relative: 1 % (ref 0–5)
HCT: 41 % (ref 36.0–46.0)
Hemoglobin: 13.7 g/dL (ref 12.0–15.0)
LYMPHS PCT: 36 % (ref 12–46)
Lymphs Abs: 3.3 10*3/uL (ref 0.7–4.0)
MCH: 28.7 pg (ref 26.0–34.0)
MCHC: 33.4 g/dL (ref 30.0–36.0)
MCV: 85.8 fL (ref 78.0–100.0)
MONO ABS: 0.8 10*3/uL (ref 0.1–1.0)
Monocytes Relative: 9 % (ref 3–12)
NEUTROS ABS: 4.9 10*3/uL (ref 1.7–7.7)
NEUTROS PCT: 54 % (ref 43–77)
Platelets: 238 10*3/uL (ref 150–400)
RBC: 4.78 MIL/uL (ref 3.87–5.11)
RDW: 14.1 % (ref 11.5–15.5)
WBC: 9.1 10*3/uL (ref 4.0–10.5)

## 2015-05-24 LAB — BASIC METABOLIC PANEL
ANION GAP: 10 (ref 5–15)
BUN: 7 mg/dL (ref 6–20)
CALCIUM: 8.9 mg/dL (ref 8.9–10.3)
CO2: 22 mmol/L (ref 22–32)
Chloride: 106 mmol/L (ref 101–111)
Creatinine, Ser: 0.67 mg/dL (ref 0.44–1.00)
GFR calc Af Amer: >60 mL/min (ref >60)
GFR calc non Af Amer: >60 mL/min (ref >60)
Glucose, Bld: 96 mg/dL (ref 65–99)
Potassium: 3.6 mmol/L (ref 3.5–5.1)
Sodium: 138 mmol/L (ref 135–145)

## 2015-05-24 LAB — GC/CHLAMYDIA PROBE AMP (~~LOC~~) NOT AT ARMC
Chlamydia: POSITIVE — AB
Neisseria Gonorrhea: NEGATIVE

## 2015-05-24 LAB — URINE MICROSCOPIC-ADD ON

## 2015-05-24 LAB — HIV ANTIBODY (ROUTINE TESTING W REFLEX): HIV SCREEN 4TH GENERATION: NONREACTIVE

## 2015-05-24 LAB — WET PREP, GENITAL
Trich, Wet Prep: NONE SEEN
YEAST WET PREP: NONE SEEN

## 2015-05-24 LAB — PREGNANCY, URINE: Preg Test, Ur: NEGATIVE

## 2015-05-24 MED ORDER — IBUPROFEN 800 MG PO TABS: 800.0000 mg | ORAL_TABLET | Freq: Three times a day (TID) | ORAL | Status: DC | PRN

## 2015-05-24 MED ORDER — METRONIDAZOLE 500 MG PO TABS: 500.0000 mg | ORAL_TABLET | Freq: Two times a day (BID) | ORAL | Status: DC

## 2015-05-24 NOTE — Discharge Instructions (Signed)
Read the information below.  Use the prescribed medication as directed.  Please discuss all new medications with your pharmacist.  You may return to the Emergency Department at any time for worsening condition or any new symptoms that concern you.     If you develop fevers, loss of control of bowel or bladder, weakness or numbness in your legs, or are unable to walk, return to the ER for a recheck.     Back Injury Prevention The following tips can help you to prevent a back injury. PHYSICAL FITNESS  Exercise often. Try to develop strong stomach (abdominal) muscles.  Do aerobic exercises often. This includes walking, jogging, biking, swimming.  Do exercises that help with balance and strength often. This includes tai chi and yoga.  Stretch before and after you exercise.  Keep a healthy weight. DIET   Ask your doctor how much calcium and vitamin D you need every day.  Include calcium in your diet. Foods high in calcium include dairy products; green, leafy vegetables; and products with calcium added (fortified).  Include vitamin D in your diet. Foods high in vitamin D include milk and products with vitamin D added.  Think about taking a multivitamin or other nutritional products called " supplements."  Stop smoking if you smoke. POSTURE   Sit and stand up straight. Avoid leaning forward or hunching over.  Choose chairs that support your lower back.  If you work at a desk:  Sit close to your work so you do not lean over.  Keep your chin tucked in.  Keep your neck drawn back.  Keep your elbows bent at a right angle. Your arms should look like the letter "L."  Sit high and close to the steering wheel when you drive. Add low back support to your car seat if needed.  Avoid sitting or standing in one position for too long. Get up and move around every hour. Take breaks if you are driving for a long time.  Sleep on your side with your knees slightly bent. You can also sleep on  your back with a pillow under your knees. Do not sleep on your stomach. LIFTING, TWISTING, AND REACHING  Avoid heavy lifting, especially lifting over and over again. If you must do heavy lifting:  Stretch before lifting.  Work slowly.  Rest between lifts.  Use carts and dollies to move objects when possible.  Make several small trips instead of carrying 1 heavy load.  Ask for help when you need it.  Ask for help when moving big, awkward objects.  Follow these steps when lifting:  Stand with your feet shoulder-width apart.  Get as close to the object as you can. Do not pick up heavy objects that are far from your body.  Use handles or lifting straps when possible.  Bend at your knees. Squat down, but keep your heels off the floor.  Keep your shoulders back, your chin tucked in, and your back straight.  Lift the object slowly. Tighten the muscles in your legs, stomach, and butt. Keep the object as close to the center of your body as possible.  Reverse these directions when you put a load down.  Do not:  Lift the object above your waist.  Twist at the waist while lifting or carrying a load. Move your feet if you need to turn, not your waist.  Bend over without bending at your knees.  Avoid reaching over your head, across a table, or for an object on  a high surface. OTHER TIPS  Avoid wet floors and keep sidewalks clear of ice.  Do not sleep on a mattress that is too soft or too hard.  Keep items that you use often within easy reach.  Put heavier objects on shelves at waist level. Put lighter objects on lower or higher shelves.  Find ways to lessen your stress. You can try exercise, massage, or relaxation.  Get help for depression or anxiety if needed. GET HELP IF:  You injure your back.  You have questions about diet, exercise, or other ways to prevent back injuries. MAKE SURE YOU:  Understand these instructions.  Will watch your condition.  Will get  help right away if you are not doing well or get worse. Document Released: 04/29/2008 Document Revised: 02/03/2012 Document Reviewed: 12/23/2011 [facility] Patient Information 2015 [place], [place]. This information is not intended to replace advice given to you by your health care provider. Make sure you discuss any questions you have with your health care provider.  Back Pain, Adult Back pain is very common. The pain often gets better over time. The cause of back pain is usually not dangerous. Most people can learn to manage their back pain on their own.  HOME CARE   Stay active. Start with short walks on flat ground if you can. Try to walk farther each day.  Do not sit, drive, or stand in one place for more than 30 minutes. Do not stay in bed.  Do not avoid exercise or work. Activity can help your back heal faster.  Be careful when you bend or lift an object. Bend at your knees, keep the object close to you, and do not twist.  Sleep on a firm mattress. Lie on your side, and bend your knees. If you lie on your back, put a pillow under your knees.  Only take medicines as told by your doctor.  Put ice on the injured area.  Put ice in a plastic bag.  Place a towel between your skin and the bag.  Leave the ice on for 15-20 minutes, 03-04 times a day for the first 2 to 3 days. After that, you can switch between ice and heat packs.  Ask your doctor about back exercises or massage.  Avoid feeling anxious or stressed. Find good ways to deal with stress, such as exercise. GET HELP RIGHT AWAY IF:   Your pain does not go away with rest or medicine.  Your pain does not go away in 1 week.  You have new problems.  You do not feel well.  The pain spreads into your legs.  You cannot control when you poop (bowel movement) or pee (urinate).  Your arms or legs feel weak or lose feeling (numbness).  You feel sick to your stomach (nauseous) or throw up (vomit).  You have belly (abdominal)  pain.  You feel like you may pass out (faint). MAKE SURE YOU:   Understand these instructions. Bacterial Vaginosis Bacterial vaginosis is an infection of the vagina. It happens when too many of certain germs (bacteria) grow in the vagina. HOME CARE Take your medicine as told by your doctor. Finish your medicine even if you start to feel better. Do not have sex until you finish your medicine and are better. Tell your sex partner that you have an infection. They should see their doctor for treatment. Practice safe sex. Use condoms. Have only one sex partner. GET HELP IF: You are not getting better after 3 days  of treatment. You have more grey fluid (discharge) coming from your vagina than before. You have more pain than before. You have a fever. MAKE SURE YOU:  Understand these instructions. Will watch your condition. Will get help right away if you are not doing well or get worse. Document Released: 08/20/2008 Document Revised: 09/01/2013 Document Reviewed: 06/23/2013 [facility] Patient Information 2015 [place], [place]. This information is not intended to replace advice given to you by your health care provider. Make sure you discuss any questions you have with your health care provider.   Will watch your condition.  Will get help right away if you are not doing well or get worse. Document Released: 04/29/2008 Document Revised: 02/03/2012 Document Reviewed: 03/15/2014 [facility] Patient Information 2015 [place], [place]. This information is not intended to replace advice given to you by your health care provider. Make sure you discuss any questions you have with your health care provider.

## 2015-05-24 NOTE — ED Notes (Signed)
Patient states " I just hurt all over" " I am hurting in my lower back and my pelvic area" " I work a lot so I think its my body just shutting down. Patient states she is sexually active and does not use condoms. Patient states she has Vaginal discharge describes as yellow thick. Patient states it has a " strong odor.

## 2015-05-24 NOTE — ED Notes (Signed)
Pt ambulated to the bathroom with ease 

## 2015-05-24 NOTE — ED Provider Notes (Signed)
CSN: [id number]     Arrival date & time 05/24/15  2952 History   First MD Initiated Contact with Patient 05/24/15 0630     Chief Complaint  Patient presents with  . Back Pain  . Vaginal Discharge     (Consider location/radiation/quality/duration/timing/severity/associated sxs/prior Treatment) The history is provided by the patient.     Pt p/w fatigue, body aches, left lower back and left pelvic pain that began two days ago.  Pain is exacerbated by movement.  Associated yellow vaginal discharge.  Has felt hot/cold x 1 week.  Denies urinary or bowel symptoms.  She is on depo and currently has vaginal spotting and cramping which she states is normal for her.  Past abdominal surgeries: c-section, complications following c-section.  Pt is sexually active with one female partner, she is monogamous is unsure if he is.    Past Medical History  Diagnosis Date  . Arrhythmia   . Headache(784.0)   . UTI (lower urinary tract infection)    Past Surgical History  Procedure Laterality Date  . Cesarean section     Family History  Problem Relation Age of Onset  . Alcohol abuse Neg Hx   . Arthritis Neg Hx   . Asthma Neg Hx   . Birth defects Neg Hx   . Cancer Neg Hx   . COPD Neg Hx   . Depression Neg Hx   . Diabetes Neg Hx   . Drug abuse Neg Hx   . Early death Neg Hx   . Hearing loss Neg Hx   . Heart disease Neg Hx   . Hyperlipidemia Neg Hx   . Hypertension Neg Hx   . Kidney disease Neg Hx   . Learning disabilities Neg Hx   . Mental illness Neg Hx   . Mental retardation Neg Hx   . Miscarriages / Stillbirths Neg Hx   . Stroke Neg Hx   . Vision loss Neg Hx    History  Substance Use Topics  . Smoking status: Never Smoker   . Smokeless tobacco: Not on file  . Alcohol Use: No   OB History    Gravida Para Term Preterm AB TAB SAB Ectopic Multiple Living   0 0 0 0 0 0 2     Review of Systems  All other systems reviewed and are negative.     Allergies  Shellfish allergy and  Amoxicillin  Home Medications   Prior to Admission medications   Medication Sig Start Date End Date Taking? Authorizing Provider  diphenhydrAMINE (BENADRYL) 25 mg capsule Take 25 mg by mouth every 8 (eight) hours as needed (for hives).   Yes Historical Provider, MD  medroxyPROGESTERone (DEPO-PROVERA) 150 MG/ML injection Inject 150 mg into the muscle every 3 (three) months.   Yes Historical Provider, MD  Multiple Vitamins-Minerals (HAIR/SKIN/NAILS PO) Take 1 tablet by mouth 3 (three) times daily.   Yes Historical Provider, MD   BP 134/88 mmHg  Pulse 89  Temp(Src) 98.3 F (36.8 C) (Oral)  Resp 18  SpO2 99% Physical Exam  Constitutional: She appears well-developed and well-nourished. No distress.  HENT:  Head: Normocephalic and atraumatic.  Neck: Neck supple.  Cardiovascular: Normal rate and regular rhythm.   Pulmonary/Chest: Effort normal and breath sounds normal. No respiratory distress. She has no wheezes. She has no rales.  Abdominal: Soft. She exhibits no distension. There is no tenderness. There is no rebound and no guarding.  Genitourinary: Uterus is not tender. Cervix exhibits no  motion tenderness. Right adnexum displays no mass, no tenderness and no fullness. Left adnexum displays no mass, no tenderness and no fullness.  Small amount of yellow discharge in vagina.  Pelvic exam performed by Gabriela EvesMary Kathryn Alexander, PA-S, under my direct supervision.    Musculoskeletal:       Back:  Neurological: She is alert.  Skin: She is not diaphoretic.  Nursing note and vitals reviewed.   ED Course  Procedures (including critical care time) Labs Review Labs Reviewed  WET PREP, GENITAL - Abnormal; Notable for the following:    Clue Cells Wet Prep HPF POC FEW (*)    WBC, Wet Prep HPF POC MANY (*)    All other components within normal limits  URINALYSIS, ROUTINE W REFLEX MICROSCOPIC (NOT AT [facility]) - Abnormal; Notable for the following:    Hgb urine dipstick MODERATE (*)    Ketones,  ur 15 (*)    All other components within normal limits  BASIC METABOLIC PANEL  CBC WITH DIFFERENTIAL/PLATELET  URINE MICROSCOPIC-ADD ON  PREGNANCY, URINE  HIV ANTIBODY (ROUTINE TESTING)  GC/CHLAMYDIA PROBE AMP ([place]) NOT AT [facility]    Imaging Review No results found.   EKG Interpretation None      MDM   Final diagnoses:  Left-sided low back pain without sciatica  Bacterial vaginosis    Afebrile, nontoxic patient with left sided pelvic/low back pain, abnormal vaginal discharge, myalgia, fatigue.  Labs unremarkable.  UAwith ketones, 3-6 WBC - doubt true hematuria, likely from mild vaginal bleeding. WBC may be vaginal.  Pt without urinary symptoms.  Pelvic exam with small amount of blood, discharge.  Wet prep with clue cells.  Tender in left lower back.  No abdominal tenderness or tenderness of pelvic exam.  Doubt PID, torsion, TOA.   D/C home with ibuprofen, flagyl, PCP follow up.  Discussed result, findings, treatment, and follow up  with patient.  Pt given return precautions.  Pt verbalizes understanding and agrees with plan.         Trixie Dredgemily Kapil Petropoulos, PA-C 05/24/15 1255  Shon Batonourtney F Horton, MD 05/24/15 [phone]

## 2015-05-25 ENCOUNTER — Telehealth (HOSPITAL_BASED_OUTPATIENT_CLINIC_OR_DEPARTMENT_OTHER): Payer: Self-pay | Admitting: Emergency Medicine

## 2015-05-25 NOTE — Telephone Encounter (Signed)
Chart handoff to EDP for treatment plan for + Chlamydia 

## 2015-05-27 ENCOUNTER — Telehealth: Payer: Self-pay | Admitting: Emergency Medicine

## 2015-05-27 NOTE — Telephone Encounter (Signed)
Post ED Visit - Positive Culture Follow-up: Successful Patient Follow-Up   Positive Chlamydia culture  [x]  Patient discharged without antimicrobial prescription and treatment is now indicated []  Organism is resistant to prescribed ED discharge antimicrobial []  Patient with positive blood cultures  Changes discussed with ED provider: Lorre NickAnthony Allen, MD New antibiotic prescription:Zithromax one gram PO x once [phone]  Contacted patient, date 05/27/15, time 1515 ID verified, patient notified of positive Chlamydia and need for treatment. STD instructions provided, patient verbalized understanding. [facility] [phone]   Ann Taylor, Ann Taylor 05/27/2015, 3:17 PM

## 2015-07-24 ENCOUNTER — Encounter (HOSPITAL_COMMUNITY): Payer: Self-pay | Admitting: Emergency Medicine

## 2015-07-24 ENCOUNTER — Emergency Department (HOSPITAL_COMMUNITY): Payer: Medicaid Other

## 2015-07-24 ENCOUNTER — Emergency Department (HOSPITAL_COMMUNITY)
Admission: EM | Admit: 2015-07-24 | Discharge: 2015-07-24 | Disposition: A | Discharge status: Home / Self Care | Payer: Medicaid Other | Attending: Emergency Medicine | Admitting: Emergency Medicine

## 2015-07-24 DIAGNOSIS — S60221A Contusion of right hand, initial encounter: Secondary | ICD-10-CM

## 2015-07-24 DIAGNOSIS — Z792 Long term (current) use of antibiotics: Secondary | ICD-10-CM | POA: Insufficient documentation

## 2015-07-24 DIAGNOSIS — Y929 Unspecified place or not applicable: Secondary | ICD-10-CM | POA: Insufficient documentation

## 2015-07-24 DIAGNOSIS — Z880 Allergy status to penicillin: Secondary | ICD-10-CM | POA: Insufficient documentation

## 2015-07-24 DIAGNOSIS — Y999 Unspecified external cause status: Secondary | ICD-10-CM | POA: Insufficient documentation

## 2015-07-24 DIAGNOSIS — Z8679 Personal history of other diseases of the circulatory system: Secondary | ICD-10-CM | POA: Insufficient documentation

## 2015-07-24 DIAGNOSIS — W2209XA Striking against other stationary object, initial encounter: Secondary | ICD-10-CM | POA: Insufficient documentation

## 2015-07-24 DIAGNOSIS — Z793 Long term (current) use of hormonal contraceptives: Secondary | ICD-10-CM | POA: Insufficient documentation

## 2015-07-24 DIAGNOSIS — Y9389 Activity, other specified: Secondary | ICD-10-CM | POA: Insufficient documentation

## 2015-07-24 DIAGNOSIS — Z87440 Personal history of urinary (tract) infections: Secondary | ICD-10-CM | POA: Insufficient documentation

## 2015-07-24 MED ORDER — NAPROXEN 500 MG PO TABS: 500.0000 mg | ORAL_TABLET | Freq: Two times a day (BID) | ORAL | Status: DC

## 2015-07-24 MED ORDER — ACETAMINOPHEN 325 MG PO TABS
650.0000 mg | ORAL_TABLET | Freq: Once | ORAL | Status: AC
  Administered 2015-07-24: 650 mg via ORAL
  Filled 2015-07-24: qty 2

## 2015-07-24 NOTE — ED Notes (Signed)
Declined W/C at D/C and was escorted to lobby by RN. 

## 2015-07-24 NOTE — ED Notes (Signed)
Pt sts right arm and hand pain after hitting on wall yesterday; CMS intact
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
## 2015-07-24 NOTE — Discharge Instructions (Signed)
Hand Contusion A hand contusion is a deep bruise on your hand area. Contusions are the result of an injury that caused bleeding under the skin. The contusion may turn blue, purple, or yellow. Minor injuries will give you a painless contusion, but more severe contusions may stay painful and swollen for a few weeks. CAUSES  A contusion is usually caused by a blow, trauma, or direct force to an area of the body. SYMPTOMS   Swelling and redness of the injured area.  Discoloration of the injured area.  Tenderness and soreness of the injured area.  Pain. DIAGNOSIS  The diagnosis can be made by taking a history and performing a physical exam. An X-ray, CT scan, or MRI may be needed to determine if there were any associated injuries, such as broken bones (fractures). TREATMENT  Often, the best treatment for a hand contusion is resting, elevating, icing, and applying cold compresses to the injured area. Over-the-counter medicines may also be recommended for pain control. HOME CARE INSTRUCTIONS   Put ice on the injured area.  Put ice in a plastic bag.  Place a towel between your skin and the bag.  Leave the ice on for 15-20 minutes, 03-04 times a day.  Only take over-the-counter or prescription medicines as directed by your caregiver. Your caregiver may recommend avoiding anti-inflammatory medicines (aspirin, ibuprofen, and naproxen) for 48 hours because these medicines may increase bruising.  If told, use an elastic wrap as directed. This can help reduce swelling. You may remove the wrap for sleeping, showering, and bathing. If your fingers become numb, cold, or blue, take the wrap off and reapply it more loosely.  Elevate your hand with pillows to reduce swelling.  Avoid overusing your hand if it is painful. SEEK IMMEDIATE MEDICAL CARE IF:   You have increased redness, swelling, or pain in your hand.  Your swelling or pain is not relieved with medicines.  You have loss of feeling in  your hand or are unable to move your fingers.  Your hand turns cold or blue.  You have pain when you move your fingers.  Your hand becomes warm to the touch.  Your contusion does not improve in 2 days. MAKE SURE YOU:   Understand these instructions.  Will watch your condition.  Will get help right away if you are not doing well or get worse. Document Released: 05/03/2002 Document Revised: 08/05/2012 Document Reviewed: 05/04/2012 [facility] Patient Information 2015 [place], [place]. This information is not intended to replace advice given to you by your health care provider. Make sure you discuss any questions you have with your health care provider. Intermetacarpal Sprain The intermetacarpal ligaments run between the knuckles at the base of the fingers. These ligaments are vulnerable to sprain and injury in which the ligament becomes overstretched or torn. Intermetacarpal sprains are classified into 3 categories. Grade 1 sprains cause pain, but the tendon is not lengthened. Grade 2 sprains include a lengthened ligament, due to the ligament being stretched or partially ruptured. With grade 2 sprains there is still function, although function may be decreased. Grade 3 sprains include a complete tear of the ligament, and the joint usually displays a loss of function.  SYMPTOMS   Severe pain at the time of injury.  Often, a feeling of popping or tearing inside the hand.  Tenderness and inflammation at the knuckles.  Bruising within a couple days of injury.  Impaired ability to use the hand. CAUSES  This condition occurs when the intermetacarpal ligaments  are subjected to a greater stress than they can handle. This causes the ligaments to become stretched or torn. RISK INCREASES WITH:  Previous hand injury.  Fighting sports (boxing, wrestling, martial arts).  Sports in which you could fall on an outstretched hand (soccer, basketball, volleyball).  Other sports with repeated hand  trauma (water polo, gymnastics).  Poor hand strength and flexibility.  Inadequate or poorly fitted protective equipment. PREVENTION   Warm up and stretch properly before activity.  Maintain appropriate conditioning:  Hand flexibility.  Muscle strength and endurance.  Applying tape, protective strapping, or a brace may help prevent injury.  Provide the hand with support during sports and practice activities for 6 to 12 months following injury. PROGNOSIS  With proper treatment, healing should occur without impairment. The length of healing varies from 2 to 12 weeks, depending on the severity of injury. RELATED COMPLICATIONS   Longer healing time, if activities are resumed too soon.  Recurring symptoms or repeated injury, resulting in a chronic problem.  Injury to other nearby structures (bone, cartilage, tendon).  Arthritis of the knuckle (intermetacarpal) joint, with repeated sprains.  Prolonged disability (sometimes).  Hand and finger stiffness or weakness. TREATMENT Treatment first involves ice and medicine to reduce pain and inflammation. An elastic compression bandage may be worn to reduce discomfort and to protect the area. Depending on the severity of injury, you may be required to restrain the area with a cast, splint, or brace. After the ligament has been allowed to heal, strengthening and stretching exercises may be needed to regain strength and a full range of motion. Exercises may be completed at home or with a therapist. Surgery is rarely needed. MEDICATION   If pain medicine is needed, nonsteroidal anti-inflammatory medicines (aspirin and ibuprofen), or other minor pain relievers (acetaminophen), are often advised.  Do not take pain medicine for 7 days before surgery.  Stronger pain relievers may be prescribed if your caregiver thinks they are needed. Use only as directed and only as much as you need. HEAT AND COLD  Cold treatment (icing) should be applied for  10 to 15 minutes every 2 to 3 hours for inflammation and pain, and immediately after activity that aggravates your symptoms. Use ice packs or an ice massage.  Heat treatment may be used before performing stretching and strengthening activities prescribed by your caregiver, physical therapist, or athletic trainer. Use a heat pack or a warm water soak. SEEK MEDICAL CARE IF:   Symptoms remain or get worse, despite treatment for longer than 2 to 4 weeks.  You experience pain, numbness, discoloration, or coldness in the hand or fingers.  You develop blue, gray, or dark fingernails.  Any of the following occur after surgery: increased pain, swelling, redness, drainage of fluids, bleeding in the affected area, or signs of infection, including fever.  New, unexplained symptoms develop. (Drugs used in treatment may produce side effects.) Document Released: 11/11/2005 Document Revised: 03/28/2014 Document Reviewed: 02/23/2009 [facility] Patient Information 2015 [place], [place]. This information is not intended to replace advice given to you by your health care provider. Make sure you discuss any questions you have with your health care provider.

## 2015-09-21 ENCOUNTER — Emergency Department (HOSPITAL_COMMUNITY)
Admission: EM | Admit: 2015-09-21 | Discharge: 2015-09-21 | Disposition: A | Discharge status: Home / Self Care | Payer: Medicaid Other | Attending: Emergency Medicine | Admitting: Emergency Medicine

## 2015-09-21 ENCOUNTER — Emergency Department (HOSPITAL_COMMUNITY): Payer: Medicaid Other

## 2015-09-21 ENCOUNTER — Encounter (HOSPITAL_COMMUNITY): Payer: Self-pay | Admitting: *Deleted

## 2015-09-21 DIAGNOSIS — Z3202 Encounter for pregnancy test, result negative: Secondary | ICD-10-CM | POA: Insufficient documentation

## 2015-09-21 DIAGNOSIS — Z880 Allergy status to penicillin: Secondary | ICD-10-CM | POA: Insufficient documentation

## 2015-09-21 DIAGNOSIS — R0781 Pleurodynia: Secondary | ICD-10-CM | POA: Insufficient documentation

## 2015-09-21 DIAGNOSIS — R000 Tachycardia, unspecified: Secondary | ICD-10-CM | POA: Insufficient documentation

## 2015-09-21 DIAGNOSIS — Z87440 Personal history of urinary (tract) infections: Secondary | ICD-10-CM | POA: Insufficient documentation

## 2015-09-21 DIAGNOSIS — J069 Acute upper respiratory infection, unspecified: Secondary | ICD-10-CM

## 2015-09-21 LAB — CBC WITH DIFFERENTIAL/PLATELET
BASOS ABS: 0 10*3/uL (ref 0.0–0.1)
BASOS PCT: 0 %
Eosinophils Absolute: 0.3 10*3/uL (ref 0.0–0.7)
Eosinophils Relative: 3 %
HEMATOCRIT: 41.4 % (ref 36.0–46.0)
Hemoglobin: 14.4 g/dL (ref 12.0–15.0)
Lymphocytes Relative: 29 %
Lymphs Abs: 2.8 10*3/uL (ref 0.7–4.0)
MCH: 29.3 pg (ref 26.0–34.0)
MCHC: 34.8 g/dL (ref 30.0–36.0)
MCV: 84.3 fL (ref 78.0–100.0)
MONO ABS: 1.2 10*3/uL — AB (ref 0.1–1.0)
Monocytes Relative: 12 %
NEUTROS ABS: 5.3 10*3/uL (ref 1.7–7.7)
Neutrophils Relative %: 56 %
PLATELETS: 264 10*3/uL (ref 150–400)
RBC: 4.91 MIL/uL (ref 3.87–5.11)
RDW: 14.2 % (ref 11.5–15.5)
WBC: 9.5 10*3/uL (ref 4.0–10.5)

## 2015-09-21 LAB — URINALYSIS, ROUTINE W REFLEX MICROSCOPIC
BILIRUBIN URINE: NEGATIVE
Glucose, UA: NEGATIVE mg/dL
KETONES UR: NEGATIVE mg/dL
NITRITE: NEGATIVE
Protein, ur: 30 mg/dL — AB
SPECIFIC GRAVITY, URINE: 1.024 (ref 1.005–1.030)
UROBILINOGEN UA: 1 mg/dL (ref 0.0–1.0)
pH: 6 (ref 5.0–8.0)

## 2015-09-21 LAB — URINE MICROSCOPIC-ADD ON

## 2015-09-21 LAB — RAPID STREP SCREEN (MED CTR MEBANE ONLY): STREPTOCOCCUS, GROUP A SCREEN (DIRECT): NEGATIVE

## 2015-09-21 LAB — COMPREHENSIVE METABOLIC PANEL
ALBUMIN: 3.8 g/dL (ref 3.5–5.0)
ALK PHOS: 32 U/L — AB (ref 38–126)
ALT: 15 U/L (ref 14–54)
ANION GAP: 7 (ref 5–15)
AST: 25 U/L (ref 15–41)
BILIRUBIN TOTAL: 0.6 mg/dL (ref 0.3–1.2)
BUN: 7 mg/dL (ref 6–20)
CALCIUM: 9.1 mg/dL (ref 8.9–10.3)
CO2: 24 mmol/L (ref 22–32)
Chloride: 103 mmol/L (ref 101–111)
Creatinine, Ser: 0.8 mg/dL (ref 0.44–1.00)
GLUCOSE: 102 mg/dL — AB (ref 65–99)
Potassium: 3.4 mmol/L — ABNORMAL LOW (ref 3.5–5.1)
Sodium: 134 mmol/L — ABNORMAL LOW (ref 135–145)
TOTAL PROTEIN: 7 g/dL (ref 6.5–8.1)

## 2015-09-21 LAB — PREGNANCY, URINE: PREG TEST UR: NEGATIVE

## 2015-09-21 LAB — I-STAT CG4 LACTIC ACID, ED: LACTIC ACID, VENOUS: 1.44 mmol/L (ref 0.5–2.0)

## 2015-09-21 MED ORDER — PROCHLORPERAZINE EDISYLATE 5 MG/ML IJ SOLN
10.0000 mg | Freq: Once | INTRAMUSCULAR | Status: AC
  Administered 2015-09-21: 10 mg via INTRAVENOUS
  Filled 2015-09-21: qty 2

## 2015-09-21 MED ORDER — AZITHROMYCIN 250 MG PO TABS: 250.0000 mg | ORAL_TABLET | Freq: Every day | ORAL | Status: DC

## 2015-09-21 MED ORDER — SODIUM CHLORIDE 0.9 % IV BOLUS (SEPSIS)
1000.0000 mL | INTRAVENOUS | Status: AC
  Administered 2015-09-21 (×2): 1000 mL via INTRAVENOUS

## 2015-09-21 MED ORDER — SODIUM CHLORIDE 0.9 % IV BOLUS (SEPSIS)
500.0000 mL | INTRAVENOUS | Status: AC
  Administered 2015-09-21: 500 mL via INTRAVENOUS

## 2015-09-21 NOTE — ED Notes (Signed)
Patient report about a week history of cough, sore throat, headache, fevers, runny nose, and discomfort to chest with cough.

## 2015-09-21 NOTE — ED Provider Notes (Addendum)
CSN: [id number]     Arrival date & time 09/21/15  1747 History   First MD Initiated Contact with Patient 09/21/15 1835     Chief Complaint  Patient presents with  . Sore Throat  . Cough     (Consider location/radiation/quality/duration/timing/severity/associated sxs/prior Treatment) HPI  Blood pressure 147/100, pulse 130, temperature 99.7 F (37.6 C), temperature source Oral, resp. rate 19, SpO2 97 %.  Ann Taylor is a 34 y.o. female complaining of tactile fever, cough, sore throat, productive cough, rhinorrhea, pleuritic chest pain, myalgia worsening over the course of 2 weeks. Patient taken multiple over-the-counter medications with little relief, states that her symptoms are not worsening but however, just not improving. When asked about sick contacts patient states that her children are always sick. She denies cervicalgia, confusion, rash, IV drug use, flu shot this year.  Past Medical History  Diagnosis Date  . Arrhythmia   . Headache(784.0)   . UTI (lower urinary tract infection)    Past Surgical History  Procedure Laterality Date  . Cesarean section     Family History  Problem Relation Age of Onset  . Alcohol abuse Neg Hx   . Arthritis Neg Hx   . Asthma Neg Hx   . Birth defects Neg Hx   . Cancer Neg Hx   . COPD Neg Hx   . Depression Neg Hx   . Diabetes Neg Hx   . Drug abuse Neg Hx   . Early death Neg Hx   . Hearing loss Neg Hx   . Heart disease Neg Hx   . Hyperlipidemia Neg Hx   . Hypertension Neg Hx   . Kidney disease Neg Hx   . Learning disabilities Neg Hx   . Mental illness Neg Hx   . Mental retardation Neg Hx   . Miscarriages / Stillbirths Neg Hx   . Stroke Neg Hx   . Vision loss Neg Hx    Social History  Substance Use Topics  . Smoking status: Never Smoker   . Smokeless tobacco: None  . Alcohol Use: No   OB History    Gravida Para Term Preterm AB TAB SAB Ectopic Multiple Living   3 2 2  0 0 0 0 0 0 2     Review of Systems  10 systems  reviewed and found to be negative, except as noted in the HPI.  Allergies  Shellfish allergy and Amoxicillin  Home Medications   Prior to Admission medications   Medication Sig Start Date End Date Taking? Authorizing Provider  aspirin-acetaminophen-caffeine (EXCEDRIN MIGRAINE) [id number] MG tablet Take 1 tablet by mouth every 6 (six) hours as needed for headache.   Yes Historical Provider, MD  diphenhydrAMINE (BENADRYL) 25 mg capsule Take 25 mg by mouth every 8 (eight) hours as needed (for hives).   Yes Historical Provider, MD  ibuprofen (ADVIL,MOTRIN) 800 MG tablet Take 1 tablet (800 mg total) by mouth every 8 (eight) hours as needed for mild pain or moderate pain. 05/24/15  Yes Emily West, PA-C   BP 147/100 mmHg  Pulse 130  Temp(Src) 99.7 F (37.6 C) (Oral)  Resp 19  SpO2 97% Physical Exam  Constitutional: She is oriented to person, place, and time. She appears well-developed and well-nourished. No distress.  HENT:  Head: Normocephalic and atraumatic.  Mouth/Throat: Oropharynx is clear and moist.  Slightly dry mucous membranes  Eyes: Conjunctivae and EOM are normal. Pupils are equal, round, and reactive to light.  Neck: Normal range of motion.  Cardiovascular: Regular rhythm and intact distal pulses.   Mild tachycardia  Pulmonary/Chest: Effort normal and breath sounds normal. No stridor.  Abdominal: Soft. There is no tenderness.  Musculoskeletal: Normal range of motion.  Neurological: She is alert and oriented to person, place, and time.  Skin: She is not diaphoretic.  Psychiatric: She has a normal mood and affect.  Nursing note and vitals reviewed.   ED Course  Procedures (including critical care time) Labs Review Labs Reviewed  CULTURE, BLOOD (ROUTINE X 2)  CULTURE, BLOOD (ROUTINE X 2)  URINE CULTURE  RAPID STREP SCREEN (NOT AT [facility])  COMPREHENSIVE METABOLIC PANEL  CBC WITH DIFFERENTIAL/PLATELET  URINALYSIS, ROUTINE W REFLEX MICROSCOPIC (NOT AT [facility])  PREGNANCY,  URINE  I-STAT CG4 LACTIC ACID, ED    Imaging Review Dg Chest 2 View  09/21/2015  CLINICAL DATA:  Cough, sore throat and chest discomfort for 1 week. EXAM: CHEST  2 VIEW COMPARISON:  04/28/2013 FINDINGS: Cardiomediastinal silhouette is normal. Mediastinal contours appear intact. There is no evidence of focal airspace consolidation, pleural effusion or pneumothorax. 5 mm probable granuloma is seen in the right lower lung field. Osseous structures are without acute abnormality. Soft tissues are grossly normal. IMPRESSION: No radiographic evidence of acute cardiopulmonary abnormality. Electronically Signed   By: Ted Mcalpine M.D.   On: 09/21/2015 19:17   I have personally reviewed and evaluated these images and lab results as part of my medical decision-making.   EKG Interpretation None      MDM   Final diagnoses:  None    Filed Vitals:   09/21/15 1752  BP: 147/100  Pulse: 130  Temp: 99.7 F (37.6 C)  TempSrc: Oral  Resp: 19  SpO2: 97%    Medications  sodium chloride 0.9 % bolus 1,000 mL (1,000 mLs Intravenous New Bag/Given 09/21/15 1933)    Followed by  sodium chloride 0.9 % bolus 500 mL (not administered)    Ann Taylor is 34 y.o. female presenting with tactile fever, productive cough, sore throat headache and myalgia with associated pleuritic chest pain not improving over the course of 2 weeks. Patient is tachycardic, afebrile in the ED and appears slightly dehydrated. Patient is aggressively hydrated. Chest x-ray without infiltrate.  This may be a influenza.   Case signed out to PA Upstill at shift change: Plan is to follow-up results of blood work, UA, strep. Reassess vitals.           Wynetta Emery, PA-C 09/21/15 1954  Benjiman Core, MD 09/22/15 0006  Wynetta Emery, PA-C 10/05/15 1650  Benjiman Core, MD 10/05/15 2248

## 2015-09-21 NOTE — ED Notes (Signed)
Lab at bedside to do blood cultures.  Per provider no need to do.

## 2015-09-21 NOTE — ED Provider Notes (Signed)
2 weeks URI Presented tachycardic Fluids with improvement UA pending  UA negative. Tachycardia improving. The patient is very well appearing, non-toxic. She is felt stable for discharge home. Discussed antibiotic use. Given duration of symptoms will treat with Z-Pack. Patient is comfortable with plan of care.   Elpidio AnisShari Temika Sutphin, PA-C 09/21/15 [phone]  Benjiman CoreNathan Pickering, MD 09/22/15 [phone]

## 2015-09-21 NOTE — Discharge Instructions (Signed)
Upper Respiratory Infection, Adult Most upper respiratory infections (URIs) are a viral infection of the air passages leading to the lungs. A URI affects the nose, throat, and upper air passages. The most common type of URI is nasopharyngitis and is typically referred to as "the common cold." URIs run their course and usually go away on their own. Most of the time, a URI does not require medical attention, but sometimes a bacterial infection in the upper airways can follow a viral infection. This is called a secondary infection. Sinus and middle ear infections are common types of secondary upper respiratory infections. Bacterial pneumonia can also complicate a URI. A URI can worsen asthma and chronic obstructive pulmonary disease (COPD). Sometimes, these complications can require emergency medical care and may be life threatening.  CAUSES Almost all URIs are caused by viruses. A virus is a type of germ and can spread from one person to another.  RISKS FACTORS You may be at risk for a URI if:   You smoke.   You have chronic heart or lung disease.  You have a weakened defense (immune) system.   You are very young or very old.   You have nasal allergies or asthma.  You work in crowded or poorly ventilated areas.  You work in health care facilities or schools. SIGNS AND SYMPTOMS  Symptoms typically develop 2-3 days after you come in contact with a cold virus. Most viral URIs last 7-10 days. However, viral URIs from the influenza virus (flu virus) can last 14-18 days and are typically more severe. Symptoms may include:   Runny or stuffy (congested) nose.   Sneezing.   Cough.   Sore throat.   Headache.   Fatigue.   Fever.   Loss of appetite.   Pain in your forehead, behind your eyes, and over your cheekbones (sinus pain).  Muscle aches.  DIAGNOSIS  Your health care provider may diagnose a URI by:  Physical exam.  Tests to check that your symptoms are not due to  another condition such as:  Strep throat.  Sinusitis.  Pneumonia.  Asthma. TREATMENT  A URI goes away on its own with time. It cannot be cured with medicines, but medicines may be prescribed or recommended to relieve symptoms. Medicines may help:  Reduce your fever.  Reduce your cough.  Relieve nasal congestion. HOME CARE INSTRUCTIONS   Take medicines only as directed by your health care provider.   Gargle warm saltwater or take cough drops to comfort your throat as directed by your health care provider.  Use a warm mist humidifier or inhale steam from a shower to increase air moisture. This may make it easier to breathe.  Drink enough fluid to keep your urine clear or pale yellow.   Eat soups and other clear broths and maintain good nutrition.   Rest as needed.   Return to work when your temperature has returned to normal or as your health care provider advises. You may need to stay home longer to avoid infecting others. You can also use a face mask and careful hand washing to prevent spread of the virus.  Increase the usage of your inhaler if you have asthma.   Do not use any tobacco products, including cigarettes, chewing tobacco, or electronic cigarettes. If you need help quitting, ask your health care provider. PREVENTION  The best way to protect yourself from getting a cold is to practice good hygiene.   Avoid oral or hand contact with people with cold   symptoms.   Wash your hands often if contact occurs.  There is no clear evidence that vitamin C, vitamin E, echinacea, or exercise reduces the chance of developing a cold. However, it is always recommended to get plenty of rest, exercise, and practice good nutrition.  SEEK MEDICAL CARE IF:   You are getting worse rather than better.   Your symptoms are not controlled by medicine.   You have chills.  You have worsening shortness of breath.  You have brown or red mucus.  You have yellow or brown nasal  discharge.  You have pain in your face, especially when you bend forward.  You have a fever.  You have swollen neck glands.  You have pain while swallowing.  You have white areas in the back of your throat. SEEK IMMEDIATE MEDICAL CARE IF:   You have severe or persistent:  Headache.  Ear pain.  Sinus pain.  Chest pain.  You have chronic lung disease and any of the following:  Wheezing.  Prolonged cough.  Coughing up blood.  A change in your usual mucus.  You have a stiff neck.  You have changes in your:  Vision.  Hearing.  Thinking.  Mood. MAKE SURE YOU:   Understand these instructions.  Will watch your condition.  Will get help right away if you are not doing well or get worse.   This information is not intended to replace advice given to you by your health care provider. Make sure you discuss any questions you have with your health care provider.   Document Released: 05/07/2001 Document Revised: 03/28/2015 Document Reviewed: 02/16/2014 Elsevier Interactive Patient Education 2016 [employer] Inc[employer]  

## 2015-09-23 LAB — URINE CULTURE

## 2015-09-23 LAB — CULTURE, GROUP A STREP: STREP A CULTURE: NEGATIVE

## 2015-09-26 LAB — CULTURE, BLOOD (ROUTINE X 2): CULTURE: NO GROWTH

## 2015-10-24 ENCOUNTER — Emergency Department (HOSPITAL_COMMUNITY)
Admission: EM | Admit: 2015-10-24 | Discharge: 2015-10-24 | Disposition: A | Discharge status: Home / Self Care | Payer: Medicaid Other | Attending: Emergency Medicine | Admitting: Emergency Medicine

## 2015-10-24 ENCOUNTER — Encounter (HOSPITAL_COMMUNITY): Payer: Self-pay | Admitting: Emergency Medicine

## 2015-10-24 DIAGNOSIS — Z87440 Personal history of urinary (tract) infections: Secondary | ICD-10-CM | POA: Insufficient documentation

## 2015-10-24 DIAGNOSIS — S0181XA Laceration without foreign body of other part of head, initial encounter: Secondary | ICD-10-CM

## 2015-10-24 DIAGNOSIS — S01111A Laceration without foreign body of right eyelid and periocular area, initial encounter: Secondary | ICD-10-CM | POA: Insufficient documentation

## 2015-10-24 DIAGNOSIS — Z8679 Personal history of other diseases of the circulatory system: Secondary | ICD-10-CM | POA: Insufficient documentation

## 2015-10-24 DIAGNOSIS — Z23 Encounter for immunization: Secondary | ICD-10-CM | POA: Insufficient documentation

## 2015-10-24 DIAGNOSIS — Y9389 Activity, other specified: Secondary | ICD-10-CM | POA: Insufficient documentation

## 2015-10-24 DIAGNOSIS — Y998 Other external cause status: Secondary | ICD-10-CM | POA: Insufficient documentation

## 2015-10-24 DIAGNOSIS — Y92009 Unspecified place in unspecified non-institutional (private) residence as the place of occurrence of the external cause: Secondary | ICD-10-CM | POA: Insufficient documentation

## 2015-10-24 DIAGNOSIS — Z8669 Personal history of other diseases of the nervous system and sense organs: Secondary | ICD-10-CM | POA: Insufficient documentation

## 2015-10-24 DIAGNOSIS — Z880 Allergy status to penicillin: Secondary | ICD-10-CM | POA: Insufficient documentation

## 2015-10-24 DIAGNOSIS — W01198A Fall on same level from slipping, tripping and stumbling with subsequent striking against other object, initial encounter: Secondary | ICD-10-CM | POA: Insufficient documentation

## 2015-10-24 MED ORDER — BACITRACIN ZINC 500 UNIT/GM EX OINT
1.0000 "application " | TOPICAL_OINTMENT | Freq: Two times a day (BID) | CUTANEOUS | Status: DC
  Administered 2015-10-24: 1 via TOPICAL

## 2015-10-24 MED ORDER — LIDOCAINE-EPINEPHRINE-TETRACAINE (LET) SOLUTION
3.0000 mL | Freq: Once | NASAL | Status: AC
  Administered 2015-10-24: 3 mL via TOPICAL
  Filled 2015-10-24: qty 3

## 2015-10-24 MED ORDER — TETANUS-DIPHTH-ACELL PERTUSSIS 5-2.5-18.5 LF-MCG/0.5 IM SUSP
0.5000 mL | Freq: Once | INTRAMUSCULAR | Status: AC
  Administered 2015-10-24: 0.5 mL via INTRAMUSCULAR
  Filled 2015-10-24: qty 0.5

## 2015-10-24 NOTE — ED Provider Notes (Signed)
CSN: [id number]     Arrival date & time 10/24/15  2023 History  By signing my name below, I, Ann Taylor, attest that this documentation has been prepared under the direction and in the presence of Ann Berry, PA-C. Electronically Signed: Angelene Taylor, ED Scribe. 10/24/2015. 9:33 PM.    Chief Complaint  Patient presents with  . Laceration   The history is provided by the patient. No language interpreter was used.   HPI Comments: Ann Taylor is a 34 y.o. female with a hx of HA who presents to the Emergency Department complaining of a small laceration on her right eyebrow that occurred when she accidentally hit her head and face into the doorway in her home. She reports associated pain to the right side of face, mild dizziness immediately after fall which has since resolved, and a HA which she describes as pain directly where she bumped her face and right eye. She states she has a history of chronic migraines and she feels like she might have one beginning. She explains that she slipped and fell on a toy which caused her to hit the right side of her face on the side of the door. She denies LOC. She also denies any nausea, vomiting, syncope, blurred vision, neck pain, or eye pain with eye movement.    Past Medical History  Diagnosis Date  . Arrhythmia   . Headache(784.0)   . UTI (lower urinary tract infection)    Past Surgical History  Procedure Laterality Date  . Cesarean section     Family History  Problem Relation Age of Onset  . Alcohol abuse Neg Hx   . Arthritis Neg Hx   . Asthma Neg Hx   . Birth defects Neg Hx   . Cancer Neg Hx   . COPD Neg Hx   . Depression Neg Hx   . Diabetes Neg Hx   . Drug abuse Neg Hx   . Early death Neg Hx   . Hearing loss Neg Hx   . Heart disease Neg Hx   . Hyperlipidemia Neg Hx   . Hypertension Neg Hx   . Kidney disease Neg Hx   . Learning disabilities Neg Hx   . Mental illness Neg Hx   . Mental retardation Neg Hx   .  Miscarriages / Stillbirths Neg Hx   . Stroke Neg Hx   . Vision loss Neg Hx    Social History  Substance Use Topics  . Smoking status: Never Smoker   . Smokeless tobacco: None  . Alcohol Use: No   OB History    Gravida Para Term Preterm AB TAB SAB Ectopic Multiple Living   0 0 0 0 0 0 2     Review of Systems  Constitutional: Negative for fever, diaphoresis, activity change, appetite change and fatigue.  HENT: Positive for facial swelling. Negative for ear discharge, ear pain, sinus pressure, sore throat, tinnitus, trouble swallowing and voice change.   Eyes: Negative for photophobia, pain, discharge, redness and visual disturbance.  Respiratory: Negative.   Cardiovascular: Negative.   Gastrointestinal: Negative.  Negative for nausea and vomiting.  Genitourinary: Negative.   Musculoskeletal: Negative.  Negative for joint swelling, gait problem, neck pain and neck stiffness.  Skin: Positive for wound (right eyebrow). Negative for color change and pallor.  Neurological: Negative for tremors, syncope, speech difficulty, weakness and light-headedness.  Psychiatric/Behavioral: Negative.  Negative for confusion.      Allergies  Shellfish allergy and Amoxicillin  Home Medications   Prior to Admission medications   Medication Sig Start Date End Date Taking? Authorizing Provider  diphenhydrAMINE (BENADRYL) 25 mg capsule Take 25 mg by mouth every 8 (eight) hours as needed (for hives).   Yes Historical Provider, MD  azithromycin (ZITHROMAX) 250 MG tablet Take 1 tablet (250 mg total) by mouth daily. Take first 2 tablets together, then 1 every day until finished. Patient not taking: Reported on 10/24/2015 09/21/15   Elpidio Anis, PA-C  ibuprofen (ADVIL,MOTRIN) 800 MG tablet Take 1 tablet (800 mg total) by mouth every 8 (eight) hours as needed for mild pain or moderate pain. Patient not taking: Reported on 10/24/2015 05/24/15   Trixie Dredge, PA-C   BP 137/97 mmHg  Pulse 104   Temp(Src) 98.7 F (37.1 C) (Oral)  Resp 16  Ht  (1.651 m)  Wt 151 lb (68.493 kg)  BMI 25.13 kg/m2  SpO2 99% Physical Exam  Constitutional: She is oriented to person, place, and time. Vital signs are normal. She appears well-developed and well-nourished. She is cooperative.  Non-toxic appearance. She does not have a sickly appearance. No distress.  HENT:  Head: Normocephalic. Head is with laceration. Head is without raccoon's eyes, without Battle's sign, without right periorbital erythema and without left periorbital erythema.    Right Ear: Tympanic membrane, external ear and ear canal normal. No drainage. No hemotympanum.  Left Ear: Tympanic membrane, external ear and ear canal normal. No drainage. No hemotympanum.  Nose: Nose normal. No mucosal edema, sinus tenderness or nasal septal hematoma. No epistaxis. Right sinus exhibits no maxillary sinus tenderness and no frontal sinus tenderness. Left sinus exhibits no maxillary sinus tenderness and no frontal sinus tenderness.  Mouth/Throat: Uvula is midline, oropharynx is clear and moist and mucous membranes are normal. No trismus in the jaw. No oropharyngeal exudate.  Eyes: Conjunctivae, EOM and lids are normal. Pupils are equal, round, and reactive to light. Right eye exhibits no chemosis and no discharge. Left eye exhibits no chemosis and no discharge. Right conjunctiva is not injected. Right conjunctiva has no hemorrhage. Left conjunctiva is not injected. Left conjunctiva has no hemorrhage. No scleral icterus. Right eye exhibits normal extraocular motion. Left eye exhibits normal extraocular motion.  Neck: Normal range of motion and full passive range of motion without pain. Neck supple. No JVD present. No spinous process tenderness and no muscular tenderness present. No rigidity. No tracheal deviation, no edema, no erythema and normal range of motion present. No Brudzinski's sign and no Kernig's sign noted.  Cardiovascular: Normal rate and  regular rhythm.   Pulmonary/Chest: Effort normal and breath sounds normal. No stridor. No respiratory distress.  Musculoskeletal: Normal range of motion. She exhibits no edema.  Lymphadenopathy:    She has no cervical adenopathy.  Neurological: She is alert and oriented to person, place, and time. She exhibits normal muscle tone. Coordination normal.  Skin: Skin is warm and dry. No rash noted. She is not diaphoretic. No erythema. No pallor.  1 cm superficial lac to lateral edge of right eyebrow, no active bleeding, surrounding edema, no ecchymosis  Psychiatric: She has a normal mood and affect. Her behavior is normal. Judgment and thought content normal.  Nursing note and vitals reviewed.       ED Course  Procedures (including critical care time) DIAGNOSTIC STUDIES: Oxygen Saturation is 99% on RA, normal by my interpretation.    COORDINATION OF CARE: 9:26 PM- Pt advised of plan for treatment and pt agrees. Will receive a tetanus  vaccine and a laceration repair. Will use LET as a local anesthesia. Advised to follow up with PCP. Recommended to return if she develops n/v, constant dizziness, or worsening HA.   LACERATION REPAIR Performed by: Ann BerryLeisa Khylen Riolo Consent: Verbal consent obtained. Risks and benefits: risks, benefits and alternatives were discussed Patient identity confirmed: provided demographic data Time out performed prior to procedure Prepped and Draped in normal sterile fashion Wound explored Laceration Location: right eyebrow Laceration Length: 1cm No Foreign Bodies seen or palpated Anesthesia: topical with LET Local anesthetic: LET Anesthetic total: see MAR Irrigation method: syringe Amount of cleaning: standard Skin closure: 6.0 prolene Number of sutures or staples: 3 Technique: Simple interupted Patient tolerance: Patient tolerated the procedure well with no immediate complications.   MDM   Final diagnoses:  Facial laceration, initial encounter   Pt with  small laceration to lateral edge of right eyebrow.  The injury occurred when she bumped the side of her head against the doorway in her home. There was no loss of consciousness. She has minimal swelling and tenderness over her right eyebrow and right cheek bone. She has no EOM deficit or pain. No concern with mechanism of injury and with history and exam for orbital injury.  LET was applied topically and the laceration was superficial without complication, was repaired with 3 simple interrupted sutures. The patient did not have any disorientation, confusion, loss of consciousness, nausea or vomiting while in the ER.  No indication for imaging at this time. Return precautions were reviewed with the patient who verbalizes understanding.  She was instructed to follow-up with her PCP in 5 days for suture removal and sooner with any concerns. She was in shock that she treat discomfort with NSAIDs, Tylenol and with applying ice.  I personally performed the services described in this documentation, which was scribed in my presence. The recorded information has been reviewed and is accurate.     Ann BerryLeisa Tanelle Lanzo, PA-C 10/26/15 [id number]  Zadie Rhineonald Wickline, MD 10/26/15 [phone]

## 2015-10-24 NOTE — ED Notes (Signed)
Pt. stepped on a toy and slipped/fell at home this evening , no LOC , presents with small laceration at right lateral eyebrow with minimal bleeding  , reports mild pain at left forehead.

## 2015-10-24 NOTE — Discharge Instructions (Signed)
Facial Laceration ° A facial laceration is a cut on the face. These injuries can be painful and cause bleeding. Lacerations usually heal quickly, but they need special care to reduce scarring. °DIAGNOSIS  °Your health care provider will take a medical history, ask for details about how the injury occurred, and examine the wound to determine how deep the cut is. °TREATMENT  °Some facial lacerations may not require closure. Others may not be able to be closed because of an increased risk of infection. The risk of infection and the chance for successful closure will depend on various factors, including the amount of time since the injury occurred. °The wound may be cleaned to help prevent infection. If closure is appropriate, pain medicines may be given if needed. Your health care provider will use stitches (sutures), wound glue (adhesive), or skin adhesive strips to repair the laceration. These tools bring the skin edges together to allow for faster healing and a better cosmetic outcome. If needed, you may also be given a tetanus shot. °HOME CARE INSTRUCTIONS °· Only take over-the-counter or prescription medicines as directed by your health care provider. °· Follow your health care provider's instructions for wound care. These instructions will vary depending on the technique used for closing the wound. °For Sutures: °· Keep the wound clean and dry.   °· If you were given a bandage (dressing), you should change it at least once a day. Also change the dressing if it becomes wet or dirty, or as directed by your health care provider.   °· Wash the wound with soap and water 2 times a day. Rinse the wound off with water to remove all soap. Pat the wound dry with a clean towel.   °· After cleaning, apply a thin layer of the antibiotic ointment recommended by your health care provider. This will help prevent infection and keep the dressing from sticking.   °· You may shower as usual after the first 24 hours. Do not soak the  wound in water until the sutures are removed.   °· Get your sutures removed as directed by your health care provider. With facial lacerations, sutures should usually be taken out after 4-5 days to avoid stitch marks.   °· Wait a few days after your sutures are removed before applying any makeup. °For Skin Adhesive Strips: °· Keep the wound clean and dry.   °· Do not get the skin adhesive strips wet. You may bathe carefully, using caution to keep the wound dry.   °· If the wound gets wet, pat it dry with a clean towel.   °· Skin adhesive strips will fall off on their own. You may trim the strips as the wound heals. Do not remove skin adhesive strips that are still stuck to the wound. They will fall off in time.   °For Wound Adhesive: °· You may briefly wet your wound in the shower or bath. Do not soak or scrub the wound. Do not swim. Avoid periods of heavy sweating until the skin adhesive has fallen off on its own. After showering or bathing, gently pat the wound dry with a clean towel.   °· Do not apply liquid medicine, cream medicine, ointment medicine, or makeup to your wound while the skin adhesive is in place. This may loosen the film before your wound is healed.   °· If a dressing is placed over the wound, be careful not to apply tape directly over the skin adhesive. This may cause the adhesive to be pulled off before the wound is healed.   °· Avoid   prolonged exposure to sunlight or tanning lamps while the skin adhesive is in place. °· The skin adhesive will usually remain in place for 5-10 days, then naturally fall off the skin. Do not pick at the adhesive film.   °After Healing: °Once the wound has healed, cover the wound with sunscreen during the day for 1 full year. This can help minimize scarring. Exposure to ultraviolet light in the first year will darken the scar. It can take 1-2 years for the scar to lose its redness and to heal completely.  °SEEK MEDICAL CARE IF: °· You have a fever. °SEEK IMMEDIATE  MEDICAL CARE IF: °· You have redness, pain, or swelling around the wound.   °· You see a yellowish-white fluid (pus) coming from the wound.   °  °This information is not intended to replace advice given to you by your health care provider. Make sure you discuss any questions you have with your health care provider. °  °Document Released: 12/19/2004 Document Revised: 12/02/2014 Document Reviewed: 06/24/2013 °Elsevier Interactive Patient Education ©2016 [employer] ° °Head Injury, Adult °You have a head injury. Headaches and throwing up (vomiting) are common after a head injury. It should be easy to wake up from sleeping. Sometimes you must stay in the hospital. Most problems happen within the first 24 hours. Side effects may occur up to 7-10 days after the injury.  °WHAT ARE THE TYPES OF HEAD INJURIES? °Head injuries can be as minor as a bump. Some head injuries can be more severe. More severe head injuries include: °· A jarring injury to the brain (concussion). °· A bruise of the brain (contusion). This mean there is bleeding in the brain that can cause swelling. °· A cracked skull (skull fracture). °· Bleeding in the brain that collects, clots, and forms a bump (hematoma). °WHEN SHOULD I GET HELP RIGHT AWAY?  °· You are confused or sleepy. °· You cannot be woken up. °· You feel sick to your stomach (nauseous) or keep throwing up (vomiting). °· Your dizziness or unsteadiness is getting worse. °· You have very bad, lasting headaches that are not helped by medicine. Take medicines only as told by your doctor. °· You cannot use your arms or legs like normal. °· You cannot walk. °· You notice changes in the black spots in the center of the colored part of your eye (pupil). °· You have clear or bloody fluid coming from your nose or ears. °· You have trouble seeing. °During the next 24 hours after the injury, you must stay with someone who can watch you. This person should get help right away (call 911 in the [place] if  you start to shake and are not able to control it (have seizures), you pass out, or you are unable to wake up. °HOW CAN I PREVENT A HEAD INJURY IN THE FUTURE? °· Wear seat belts. °· Wear a helmet while bike riding and playing sports like football. °· Stay away from dangerous activities around the house. °WHEN CAN I RETURN TO NORMAL ACTIVITIES AND ATHLETICS? °See your doctor before doing these activities. You should not do normal activities or play contact sports until 1 week after the following symptoms have stopped: °· Headache that does not go away. °· Dizziness. °· Poor attention. °· Confusion. °· Memory problems. °· Sickness to your stomach or throwing up. °· Tiredness. °· Fussiness. °· Bothered by bright lights or loud noises. °· Anxiousness or depression. °· Restless sleep. °MAKE SURE YOU:  °· Understand these instructions. °· Will   watch your condition. °· Will get help right away if you are not doing well or get worse. °  °This information is not intended to replace advice given to you by your health care provider. Make sure you discuss any questions you have with your health care provider. °  °Document Released: 10/24/2008 Document Revised: 12/02/2014 Document Reviewed: 07/19/2013 °Elsevier Interactive Patient Education ©2016 [employer] Inc[employer] ° °

## 2015-11-04 ENCOUNTER — Emergency Department (HOSPITAL_COMMUNITY)
Admission: EM | Admit: 2015-11-04 | Discharge: 2015-11-04 | Disposition: A | Discharge status: Home / Self Care | Payer: Medicaid Other | Attending: Physician Assistant | Admitting: Physician Assistant

## 2015-11-04 ENCOUNTER — Encounter (HOSPITAL_COMMUNITY): Payer: Self-pay | Admitting: Emergency Medicine

## 2015-11-04 DIAGNOSIS — Z4802 Encounter for removal of sutures: Secondary | ICD-10-CM | POA: Insufficient documentation

## 2015-11-04 DIAGNOSIS — Z8679 Personal history of other diseases of the circulatory system: Secondary | ICD-10-CM | POA: Insufficient documentation

## 2015-11-04 DIAGNOSIS — Z87440 Personal history of urinary (tract) infections: Secondary | ICD-10-CM | POA: Insufficient documentation

## 2015-11-04 DIAGNOSIS — Z880 Allergy status to penicillin: Secondary | ICD-10-CM | POA: Insufficient documentation

## 2015-11-04 NOTE — Discharge Instructions (Signed)
You have been seen today for suture removal. Your wound seems to be healing well and shows no signs of infection. Follow up with PCP as needed. Return to ED should symptoms worsen.

## 2015-11-04 NOTE — ED Notes (Signed)
Pt on phone during discharge, did not want to end her call, and stated she was in too much of a hurry to sign for discharge instructions.  Pt verbalized understanding of discharge instructions.

## 2015-11-04 NOTE — ED Notes (Signed)
Registration at bedside.

## 2015-11-04 NOTE — ED Provider Notes (Signed)
CSN: [id number]     Arrival date & time 11/04/15  1518 History   First MD Initiated Contact with Patient 11/04/15 1523     Chief Complaint  Patient presents with  . Suture / Staple Removal     (Consider location/radiation/quality/duration/timing/severity/associated sxs/prior Treatment) HPI   Ann Taylor is a 34 y.o. female presenting to the ED for removal of sutures from the wounds at her right eyebrow. The sutures were placed in this facility on November 29. Patient states that she was told to come back in 5-7 days but was unable to until today. Patient denies any pain, redness, discharge, dehiscence, fever/chills, or any other complaints.  Past Medical History  Diagnosis Date  . Arrhythmia   . Headache(784.0)   . UTI (lower urinary tract infection)    Past Surgical History  Procedure Laterality Date  . Cesarean section     Family History  Problem Relation Age of Onset  . Alcohol abuse Neg Hx   . Arthritis Neg Hx   . Asthma Neg Hx   . Birth defects Neg Hx   . Cancer Neg Hx   . COPD Neg Hx   . Depression Neg Hx   . Diabetes Neg Hx   . Drug abuse Neg Hx   . Early death Neg Hx   . Hearing loss Neg Hx   . Heart disease Neg Hx   . Hyperlipidemia Neg Hx   . Hypertension Neg Hx   . Kidney disease Neg Hx   . Learning disabilities Neg Hx   . Mental illness Neg Hx   . Mental retardation Neg Hx   . Miscarriages / Stillbirths Neg Hx   . Stroke Neg Hx   . Vision loss Neg Hx    Social History  Substance Use Topics  . Smoking status: Never Smoker   . Smokeless tobacco: None  . Alcohol Use: No   OB History    Gravida Para Term Preterm AB TAB SAB Ectopic Multiple Living   3 2 2  0 0 0 0 0 0 2     Review of Systems  Constitutional: Negative for fever and chills.  Skin:       Suture removal      Allergies  Shellfish allergy and Amoxicillin  Home Medications   Prior to Admission medications   Medication Sig Start Date End Date Taking? Authorizing Provider   azithromycin (ZITHROMAX) 250 MG tablet Take 1 tablet (250 mg total) by mouth daily. Take first 2 tablets together, then 1 every day until finished. Patient not taking: Reported on 10/24/2015 09/21/15   Elpidio AnisShari Upstill, PA-C  diphenhydrAMINE (BENADRYL) 25 mg capsule Take 25 mg by mouth every 8 (eight) hours as needed (for hives).    Historical Provider, MD  ibuprofen (ADVIL,MOTRIN) 800 MG tablet Take 1 tablet (800 mg total) by mouth every 8 (eight) hours as needed for mild pain or moderate pain. Patient not taking: Reported on 10/24/2015 05/24/15   Trixie DredgeEmily West, PA-C   BP 142/79 mmHg  Pulse 76  Temp(Src) 98.4 F (36.9 C) (Oral)  Resp 16  SpO2 100% Physical Exam  Constitutional: She appears well-developed and well-nourished.  Cardiovascular: Normal rate and regular rhythm.   Neurological: She is alert.  Skin: Skin is warm and dry.  Wound lateral to the right eyebrow seems to be healing well with no dehiscence. No discharge or bleeding noted. 3 simple sutures seem to be present. No erythema, swelling, streaking, or other signs of infection.  Nursing  note and vitals reviewed.   ED Course  .Suture Removal Date/Time: 11/04/2015 3:31 PM Performed by: Anselm Pancoast Authorized by: Anselm Pancoast Consent: Verbal consent obtained. Risks and benefits: risks, benefits and alternatives were discussed Consent given by: patient Patient understanding: patient states understanding of the procedure being performed Patient consent: the patient's understanding of the procedure matches consent given Procedure consent: procedure consent matches procedure scheduled Patient identity confirmed: verbally with patient and arm band Time out: Immediately prior to procedure a "time out" was called to verify the correct patient, procedure, equipment, support staff and site/side marked as required. Body area: head/neck Location details: right eyebrow Wound Appearance: clean Sutures Removed: 3 Post-removal: dressing  applied and antibiotic ointment applied Facility: sutures placed in this facility Patient tolerance: Patient tolerated the procedure well with no immediate complications   (including critical care time) Labs Review Labs Reviewed - No data to display  Imaging Review No results found. I have personally reviewed and evaluated these images and lab results as part of my medical decision-making.   EKG Interpretation None      MDM   Final diagnoses:  Visit for suture removal    Marveline Profeta Hammer presents for suture removal.  The patient is here for removal of sutures placed on November 29. Wound seems to be healing well with no signs of infection. No drainage or bleeding noted. Patient was instructed to continue to keep the wound clean and dry and given aftercare instructions. Patient voices understanding of these instructions.    Anselm Pancoast, PA-C 11/04/15 1540  Courteney Lyn Mackuen, MD 11/04/15 1626

## 2015-11-04 NOTE — ED Notes (Signed)
Pt able to ambulate independently 

## 2015-11-04 NOTE — ED Notes (Signed)
Pt here for suture removal over her right eyebrow

## 2015-11-04 NOTE — ED Notes (Signed)
PA completing suture removal at this time

## 2017-03-16 ENCOUNTER — Emergency Department
Admission: EM | Admit: 2017-03-16 | Discharge: 2017-03-16 | Disposition: A | Discharge status: Home / Self Care | Payer: Medicaid Other | Attending: Emergency Medicine | Admitting: Emergency Medicine

## 2017-03-16 ENCOUNTER — Encounter: Payer: Self-pay | Admitting: Emergency Medicine

## 2017-03-16 DIAGNOSIS — J069 Acute upper respiratory infection, unspecified: Secondary | ICD-10-CM | POA: Insufficient documentation

## 2017-03-16 LAB — INFLUENZA PANEL BY PCR (TYPE A & B)
INFLAPCR: NEGATIVE
INFLBPCR: NEGATIVE

## 2017-03-16 MED ORDER — LIDOCAINE VISCOUS 2 % MT SOLN: 10.0000 mL | OROMUCOSAL | 0 refills | Status: DC | PRN

## 2017-03-16 MED ORDER — FLUTICASONE PROPIONATE 50 MCG/ACT NA SUSP: 2.0000 | Freq: Every day | NASAL | 0 refills | Status: DC

## 2017-03-16 NOTE — ED Triage Notes (Signed)
Pt comes into the ED via POV c/o flu like symptoms with headache, cough, and nasal pressure.  Patient states it started 3 days ago and she has been around multiple sick people.  Patient has even and unlabored respirations and is in NAD at this time.  Denies any chest pain, shortness of breath, or lightheadedness.

## 2017-03-16 NOTE — ED Provider Notes (Signed)
[facility] Emergency Department Provider Note  ____________________________________________  Time seen: Approximately 4:06 PM  I have reviewed the triage vital signs and the nursing notes.   HISTORY  Chief Complaint URI    HPI Ann Taylor is a 36 y.o. female that presents to the emergency department with headache, muscle aches, fatigue, nasal congestion, sore throat, non productive cough for 3 days. Patient states that she came to the emergency department because she wants to be checked for the flu. Her children were sick last week with similar symptoms. She is eating and drinking normally. No alleviating was tried.She denies fever, visual changes, shortness of breath, chest pain, nausea, vomiting, abdominal pain, diarrhea, constipation.   Past Medical History:  Diagnosis Date  . Arrhythmia   . Headache(784.0)   . UTI (lower urinary tract infection)     There are no active problems to display for this patient.   Past Surgical History:  Procedure Laterality Date  . CESAREAN SECTION      Prior to Admission medications   Medication Sig Start Date End Date Taking? Authorizing Provider  azithromycin (ZITHROMAX) 250 MG tablet Take 1 tablet (250 mg total) by mouth daily. Take first 2 tablets together, then 1 every day until finished. Patient not taking: Reported on 10/24/2015 09/21/15   Elpidio Anis, PA-C  diphenhydrAMINE (BENADRYL) 25 mg capsule Take 25 mg by mouth every 8 (eight) hours as needed (for hives).    Historical Provider, MD  fluticasone (FLONASE) 50 MCG/ACT nasal spray Place 2 sprays into both nostrils daily. 03/16/17 03/16/18  Enid Derry, PA-C  ibuprofen (ADVIL,MOTRIN) 800 MG tablet Take 1 tablet (800 mg total) by mouth every 8 (eight) hours as needed for mild pain or moderate pain. Patient not taking: Reported on 10/24/2015 05/24/15   Trixie Dredge, PA-C  lidocaine (XYLOCAINE) 2 % solution Use as directed 10 mLs in the mouth or throat as  needed for mouth pain. 03/16/17   Enid Derry, PA-C    Allergies Shellfish allergy and Amoxicillin  Family History  Problem Relation Age of Onset  . Alcohol abuse Neg Hx   . Arthritis Neg Hx   . Asthma Neg Hx   . Birth defects Neg Hx   . Cancer Neg Hx   . COPD Neg Hx   . Depression Neg Hx   . Diabetes Neg Hx   . Drug abuse Neg Hx   . Early death Neg Hx   . Hearing loss Neg Hx   . Heart disease Neg Hx   . Hyperlipidemia Neg Hx   . Hypertension Neg Hx   . Kidney disease Neg Hx   . Learning disabilities Neg Hx   . Mental illness Neg Hx   . Mental retardation Neg Hx   . Miscarriages / Stillbirths Neg Hx   . Stroke Neg Hx   . Vision loss Neg Hx     Social History Social History  Substance Use Topics  . Smoking status: Never Smoker  . Smokeless tobacco: Never Used  . Alcohol use No     Review of Systems  Constitutional: No fever/chills Eyes: No visual changes. No discharge. ENT: Positive for congestion and rhinorrhea. Cardiovascular: No chest pain. Respiratory: Positive for cough. No SOB. Gastrointestinal: No abdominal pain.  No nausea, no vomiting.  No diarrhea.  No constipation. Musculoskeletal: Negative for musculoskeletal pain. Skin: Negative for rash, abrasions, lacerations, ecchymosis. Neurological: Negative for headaches.   ____________________________________________   PHYSICAL EXAM:  VITAL SIGNS: ED Triage Vitals  Enc Vitals Group     BP 03/16/17 1129 (!) 153/101     Pulse Rate 03/16/17 1129 (!) 150     Resp 03/16/17 1129 18     Temp 03/16/17 1129 98.9 F (37.2 C)     Temp Source 03/16/17 1129 Oral     SpO2 03/16/17 1129 99 %     Weight 03/16/17 1129 156 lb (70.8 kg)     Height 03/16/17 1129  (1.651 m)     Head Circumference --      Peak Flow --      Pain Score 03/16/17 1130 8     Pain Loc --      Pain Edu? --      Excl. in GC? --      Constitutional: Alert and oriented. Well appearing and in no acute distress. Eyes: Conjunctivae  are normal. PERRL. EOMI. No discharge. Head: Atraumatic. ENT: No frontal and maxillary sinus tenderness.      Ears: Tympanic membranes pearly gray with good landmarks. No discharge.      Nose: Mild congestion/rhinnorhea.      Mouth/Throat: Mucous membranes are moist. Oropharynx non-erythematous. Tonsils not enlarged. No exudates. Uvula midline. Neck: No stridor.   Hematological/Lymphatic/Immunilogical: No cervical lymphadenopathy. Cardiovascular: Normal rate, regular rhythm.  Good peripheral circulation. Respiratory: Normal respiratory effort without tachypnea or retractions. Lungs CTAB. Good air entry to the bases with no decreased or absent breath sounds. Gastrointestinal: Bowel sounds 4 quadrants. Soft and nontender to palpation. No guarding or rigidity. No palpable masses. No distention. Musculoskeletal: Full range of motion to all extremities. No gross deformities appreciated. Neurologic:  Normal speech and language. No gross focal neurologic deficits are appreciated.  Skin:  Skin is warm, dry and intact. No rash noted.   ____________________________________________   LABS (all labs ordered are listed, but only abnormal results are displayed)  Labs Reviewed  INFLUENZA PANEL BY PCR (TYPE A & B)   ____________________________________________  EKG   ____________________________________________  RADIOLOGY  No results found.  ____________________________________________    PROCEDURES  Procedure(s) performed:    Procedures    Medications - No data to display   ____________________________________________   INITIAL IMPRESSION / ASSESSMENT AND PLAN / ED COURSE  Pertinent labs & imaging results that were available during my care of the patient were reviewed by me and considered in my medical decision making (see chart for details).  Review of the [place] CS[place] was performed in accordance of the [facility] prior to dispensing any controlled drugs.     Patient's  diagnosis is consistent with Viral upper respiratory infection. Vital signs and exam are reassuring. Influenza test negative. Patient appears well and is staying well hydrated. Patient should alternate tylenol and ibuprofen for fever. Patient feels comfortable going home. Patient will be discharged home with prescriptions for viscous Lidocaine and Flonase. Patient is to follow up with PCP as needed or otherwise directed. Patient is given ED precautions to return to the ED for any worsening or new symptoms.     ____________________________________________  FINAL CLINICAL IMPRESSION(S) / ED DIAGNOSES  Final diagnoses:  Viral upper respiratory tract infection      NEW MEDICATIONS STARTED DURING THIS VISIT:  Discharge Medication List as of 03/16/2017  2:06 PM    START taking these medications   Details  fluticasone (FLONASE) 50 MCG/ACT nasal spray Place 2 sprays into both nostrils daily., Starting Sun 03/16/2017, Until Mon 03/16/2018, Print    lidocaine (XYLOCAINE) 2 % solution Use as directed 10  mLs in the mouth or throat as needed for mouth pain., Starting Sun 03/16/2017, Print            This chart was dictated using voice recognition software/Dragon. Despite best efforts to proofread, errors can occur which can change the meaning. Any change was purely unintentional.    Enid Derry, PA-C 03/16/17 1610    Arnaldo Natal, MD 03/18/17 Mikle Bosworth

## 2017-03-30 ENCOUNTER — Encounter: Payer: Self-pay | Admitting: Emergency Medicine

## 2017-03-30 ENCOUNTER — Emergency Department
Admission: EM | Admit: 2017-03-30 | Discharge: 2017-03-30 | Disposition: A | Discharge status: Home / Self Care | Payer: Medicaid Other | Attending: Emergency Medicine | Admitting: Emergency Medicine

## 2017-03-30 DIAGNOSIS — N390 Urinary tract infection, site not specified: Secondary | ICD-10-CM | POA: Insufficient documentation

## 2017-03-30 LAB — URINALYSIS, COMPLETE (UACMP) WITH MICROSCOPIC
BILIRUBIN URINE: NEGATIVE
GLUCOSE, UA: NEGATIVE mg/dL
Ketones, ur: NEGATIVE mg/dL
NITRITE: POSITIVE — AB
Protein, ur: 30 mg/dL — AB
Specific Gravity, Urine: 1.014 (ref 1.005–1.030)
pH: 6 (ref 5.0–8.0)

## 2017-03-30 MED ORDER — PHENAZOPYRIDINE HCL 200 MG PO TABS: 200.0000 mg | ORAL_TABLET | Freq: Three times a day (TID) | ORAL | 0 refills | Status: DC | PRN

## 2017-03-30 MED ORDER — SULFAMETHOXAZOLE-TRIMETHOPRIM 800-160 MG PO TABS: 1.0000 | ORAL_TABLET | Freq: Two times a day (BID) | ORAL | 0 refills | Status: DC

## 2017-03-30 NOTE — Discharge Instructions (Signed)
Advised to have urine retested in 10 days.

## 2017-03-30 NOTE — ED Triage Notes (Signed)
C/O burning, urgency, frequency with urination x 2 days.  Patient took AZO, but no relief.  Last Azo tablet was yesterday evening.  Denies vaginal discharge.

## 2017-03-30 NOTE — ED Provider Notes (Signed)
[facility] Emergency Department Provider Note   ____________________________________________   First MD Initiated Contact with Patient 03/30/17 1258     (approximate)  I have reviewed the triage vital signs and the nursing notes.   HISTORY  Chief Complaint Dysuria    HPI Lolita Riegeratalie D Toback is a 36 y.o. female patient complaining of urinary frequency and urgency and burning for 2 days. Patient stated no relief with a Zoloft. Last Azle tablet was taking his the evening. Patient denies vaginal discharge, flank pain, or fever.Patient denies pain at this time. No other palliative measures taken for this complaint.   Past Medical History:  Diagnosis Date  . Arrhythmia   . Headache(784.0)   . UTI (lower urinary tract infection)     There are no active problems to display for this patient.   Past Surgical History:  Procedure Laterality Date  . CESAREAN SECTION      Prior to Admission medications   Medication Sig Start Date End Date Taking? Authorizing Provider  azithromycin (ZITHROMAX) 250 MG tablet Take 1 tablet (250 mg total) by mouth daily. Take first 2 tablets together, then 1 every day until finished. Patient not taking: Reported on 10/24/2015 09/21/15   Elpidio AnisUpstill, Shari, PA-C  diphenhydrAMINE (BENADRYL) 25 mg capsule Take 25 mg by mouth every 8 (eight) hours as needed (for hives).    [provider]  fluticasone (FLONASE) 50 MCG/ACT nasal spray Place 2 sprays into both nostrils daily. 03/16/17 03/16/18  Enid DerryWagner, Ashley, PA-C  ibuprofen (ADVIL,MOTRIN) 800 MG tablet Take 1 tablet (800 mg total) by mouth every 8 (eight) hours as needed for mild pain or moderate pain. Patient not taking: Reported on 10/24/2015 05/24/15   Trixie DredgeWest, Emily, PA-C  lidocaine (XYLOCAINE) 2 % solution Use as directed 10 mLs in the mouth or throat as needed for mouth pain. 03/16/17   Enid DerryWagner, Ashley, PA-C  phenazopyridine (PYRIDIUM) 200 MG tablet Take 1 tablet (200 mg total) by  mouth 3 (three) times daily as needed for pain. 03/30/17   Joni ReiningSmith, Ronald K, PA-C  sulfamethoxazole-trimethoprim (BACTRIM DS,SEPTRA DS) 800-160 MG tablet Take 1 tablet by mouth 2 (two) times daily. 03/30/17   Joni ReiningSmith, Ronald K, PA-C    Allergies Shellfish allergy and Amoxicillin  Family History  Problem Relation Age of Onset  . Alcohol abuse Neg Hx   . Arthritis Neg Hx   . Asthma Neg Hx   . Birth defects Neg Hx   . Cancer Neg Hx   . COPD Neg Hx   . Depression Neg Hx   . Diabetes Neg Hx   . Drug abuse Neg Hx   . Early death Neg Hx   . Hearing loss Neg Hx   . Heart disease Neg Hx   . Hyperlipidemia Neg Hx   . Hypertension Neg Hx   . Kidney disease Neg Hx   . Learning disabilities Neg Hx   . Mental illness Neg Hx   . Mental retardation Neg Hx   . Miscarriages / Stillbirths Neg Hx   . Stroke Neg Hx   . Vision loss Neg Hx     Social History Social History  Substance Use Topics  . Smoking status: Never Smoker  . Smokeless tobacco: Never Used  . Alcohol use No    Review of Systems  Constitutional: No fever/chills Eyes: No visual changes. ENT: No sore throat. Cardiovascular: Denies chest pain. Respiratory: Denies shortness of breath. Gastrointestinal: No abdominal pain.  No nausea, no vomiting.  No  diarrhea.  No constipation. Genitourinary:Positive for dysuria. Musculoskeletal: Negative for back pain. Skin: Negative for rash. Neurological: Negative for headaches, focal weakness or numbness. Allergic/Immunilogical: Shellfish and amoxicillin ____________________________________________   PHYSICAL EXAM:  VITAL SIGNS: ED Triage Vitals [03/30/17 1210]  Enc Vitals Group     BP (!) 140/99     Pulse Rate (!) 106     Resp 16     Temp 98.6 F (37 C)     Temp src      SpO2 98 %     Weight 156 lb (70.8 kg)     Height 5\' 5"  (1.651 m)     Head Circumference      Peak Flow      Pain Score 0     Pain Loc      Pain Edu?      Excl. in GC?     Constitutional: Alert and  oriented. Well appearing and in no acute distress. Eyes: Conjunctivae are normal. PERRL. EOMI. Head: Atraumatic. Nose: No congestion/rhinnorhea. Mouth/Throat: Mucous membranes are moist.  Oropharynx non-erythematous. Neck: No stridor.  {o cervical spine tenderness to palpation. Hematological/Lymphatic/Immunilogical: No cervical lymphadenopathy. Cardiovascular: Normal rate, regular rhythm. Grossly normal heart sounds.  Good peripheral circulation. Respiratory: Normal respiratory effort.  No retractions. Lungs CTAB. Gastrointestinal: Soft and nontender. No distention. No abdominal bruits. No CVA tenderness. Musculoskeletal: No lower extremity tenderness nor edema.  No joint effusions. Neurologic:  Normal speech and language. No gross focal neurologic deficits are appreciated. No gait instability. Skin:  Skin is warm, dry and intact. No rash noted. Psychiatric: Mood and affect are normal. Speech and behavior are normal.  ____________________________________________   LABS (all labs ordered are listed, but only abnormal results are displayed)  Labs Reviewed  URINALYSIS, COMPLETE (UACMP) WITH MICROSCOPIC - Abnormal; Notable for the following:       Result Value   Color, Urine AMBER (*)    APPearance CLOUDY (*)    Hgb urine dipstick MODERATE (*)    Protein, ur 30 (*)    Nitrite POSITIVE (*)    Leukocytes, UA LARGE (*)    Bacteria, UA RARE (*)    Squamous Epithelial / LPF 6-30 (*)    Non Squamous Epithelial 0-5 (*)    All other components within normal limits   ____________________________________________  EKG   ____________________________________________  RADIOLOGY   ____________________________________________   PROCEDURES  Procedure(s) performed: None  Procedures  Critical Care performed: No  ____________________________________________   INITIAL IMPRESSION / ASSESSMENT AND PLAN / ED COURSE  Pertinent labs & imaging results that were available during my care  of the patient were reviewed by me and considered in my medical decision making (see chart for details).  Patient complains labs consistent urinary tract infection. Patient given discharge care instructions. Patient advised to have urine retested 10 days after she finished her antibiotic therapy.      ____________________________________________   FINAL CLINICAL IMPRESSION(S) / ED DIAGNOSES  Final diagnoses:  Urinary tract infection without hematuria, site unspecified      NEW MEDICATIONS STARTED DURING THIS VISIT:  New Prescriptions   PHENAZOPYRIDINE (PYRIDIUM) 200 MG TABLET    Take 1 tablet (200 mg total) by mouth 3 (three) times daily as needed for pain.   SULFAMETHOXAZOLE-TRIMETHOPRIM (BACTRIM DS,SEPTRA DS) 800-160 MG TABLET    Take 1 tablet by mouth 2 (two) times daily.     Note:  This document was prepared using Dragon voice recognition software and may include unintentional dictation errors.  Joni Reining, PA-C 03/30/17 1340    Governor Rooks, MD 03/30/17 [phone]

## 2017-05-14 ENCOUNTER — Encounter: Payer: Self-pay | Admitting: Emergency Medicine

## 2017-05-14 ENCOUNTER — Emergency Department
Admission: EM | Admit: 2017-05-14 | Discharge: 2017-05-14 | Disposition: A | Discharge status: Home / Self Care | Payer: Medicaid Other | Attending: Emergency Medicine | Admitting: Emergency Medicine

## 2017-05-14 ENCOUNTER — Emergency Department: Payer: Medicaid Other

## 2017-05-14 DIAGNOSIS — Y999 Unspecified external cause status: Secondary | ICD-10-CM | POA: Insufficient documentation

## 2017-05-14 DIAGNOSIS — W2209XA Striking against other stationary object, initial encounter: Secondary | ICD-10-CM | POA: Insufficient documentation

## 2017-05-14 DIAGNOSIS — Y939 Activity, unspecified: Secondary | ICD-10-CM | POA: Insufficient documentation

## 2017-05-14 DIAGNOSIS — S60221A Contusion of right hand, initial encounter: Secondary | ICD-10-CM | POA: Insufficient documentation

## 2017-05-14 DIAGNOSIS — Z79899 Other long term (current) drug therapy: Secondary | ICD-10-CM | POA: Insufficient documentation

## 2017-05-14 DIAGNOSIS — Y929 Unspecified place or not applicable: Secondary | ICD-10-CM | POA: Insufficient documentation

## 2017-05-14 MED ORDER — MELOXICAM 15 MG PO TABS: 15.0000 mg | ORAL_TABLET | Freq: Every day | ORAL | 0 refills | Status: DC

## 2017-05-14 NOTE — ED Provider Notes (Signed)
[facility] Emergency Department Provider Note  ____________________________________________  Time seen: Approximately 9:54 PM  I have reviewed the triage vital signs and the nursing notes.   HISTORY  Chief Complaint Hand Pain    HPI Lolita Riegeratalie D Taormina is a 36 y.o. female who presents to emergency department complaining of hitting a table with her hand. Patient reports that immediately she did not have significant pain but over the past 2 days she has had some minor swelling, bruising, pain to her right hand. Patient reports that she is able to move all fingers appropriately. She denies any numbness or tingling in her fingers. Patient reports that there is pain with any kind of weightbearing or lifting of heavy objects. No history of previous fractures. No other injuries or complaints. No medications prior to arrival.   Past Medical History:  Diagnosis Date  . Arrhythmia   . Headache(784.0)   . UTI (lower urinary tract infection)     There are no active problems to display for this patient.   Past Surgical History:  Procedure Laterality Date  . CESAREAN SECTION      Prior to Admission medications   Medication Sig Start Date End Date Taking? Authorizing Provider  azithromycin (ZITHROMAX) 250 MG tablet Take 1 tablet (250 mg total) by mouth daily. Take first 2 tablets together, then 1 every day until finished. Patient not taking: Reported on 10/24/2015 09/21/15   Elpidio AnisUpstill, Shari, PA-C  diphenhydrAMINE (BENADRYL) 25 mg capsule Take 25 mg by mouth every 8 (eight) hours as needed (for hives).    [provider]  fluticasone (FLONASE) 50 MCG/ACT nasal spray Place 2 sprays into both nostrils daily. 03/16/17 03/16/18  Enid DerryWagner, Ashley, PA-C  ibuprofen (ADVIL,MOTRIN) 800 MG tablet Take 1 tablet (800 mg total) by mouth every 8 (eight) hours as needed for mild pain or moderate pain. Patient not taking: Reported on 10/24/2015 05/24/15   Trixie DredgeWest, Emily, PA-C   lidocaine (XYLOCAINE) 2 % solution Use as directed 10 mLs in the mouth or throat as needed for mouth pain. 03/16/17   Enid DerryWagner, Ashley, PA-C  meloxicam (MOBIC) 15 MG tablet Take 1 tablet (15 mg total) by mouth daily. 05/14/17   Cuthriell, Delorise RoyalsJonathan D, PA-C  phenazopyridine (PYRIDIUM) 200 MG tablet Take 1 tablet (200 mg total) by mouth 3 (three) times daily as needed for pain. 03/30/17   Joni ReiningSmith, Ronald K, PA-C  sulfamethoxazole-trimethoprim (BACTRIM DS,SEPTRA DS) 800-160 MG tablet Take 1 tablet by mouth 2 (two) times daily. 03/30/17   Joni ReiningSmith, Ronald K, PA-C    Allergies Shellfish allergy and Amoxicillin  Family History  Problem Relation Age of Onset  . Alcohol abuse Neg Hx   . Arthritis Neg Hx   . Asthma Neg Hx   . Birth defects Neg Hx   . Cancer Neg Hx   . COPD Neg Hx   . Depression Neg Hx   . Diabetes Neg Hx   . Drug abuse Neg Hx   . Early death Neg Hx   . Hearing loss Neg Hx   . Heart disease Neg Hx   . Hyperlipidemia Neg Hx   . Hypertension Neg Hx   . Kidney disease Neg Hx   . Learning disabilities Neg Hx   . Mental illness Neg Hx   . Mental retardation Neg Hx   . Miscarriages / Stillbirths Neg Hx   . Stroke Neg Hx   . Vision loss Neg Hx     Social History Social History  Substance Use  Topics  . Smoking status: Never Smoker  . Smokeless tobacco: Never Used  . Alcohol use No     Review of Systems  Constitutional: No fever/chills Eyes: No visual changes.  Cardiovascular: no chest pain. Respiratory: no cough. No SOB. Gastrointestinal: No abdominal pain.  No nausea, no vomiting.  Musculoskeletal: Positive for pain to the right hand Skin: Negative for rash, abrasions, lacerations, ecchymosis. Neurological: Negative for headaches, focal weakness or numbness. 10-point ROS otherwise negative.  ____________________________________________   PHYSICAL EXAM:  VITAL SIGNS: ED Triage Vitals [05/14/17 2036]  Enc Vitals Group     BP      Pulse Rate 87     Resp 18     Temp  98.8 F (37.1 C)     Temp Source Oral     SpO2 98 %     Weight 156 lb (70.8 kg)     Height 5\' 5"  (1.651 m)     Head Circumference      Peak Flow      Pain Score 8     Pain Loc      Pain Edu?      Excl. in GC?      Constitutional: Alert and oriented. Well appearing and in no acute distress. Eyes: Conjunctivae are normal. PERRL. EOMI. Head: Atraumatic. Neck: No stridor.    Cardiovascular: Normal rate, regular rhythm. Normal S1 and S2.  Good peripheral circulation. Respiratory: Normal respiratory effort without tachypnea or retractions. Lungs CTAB. Good air entry to the bases with no decreased or absent breath sounds. Musculoskeletal: Full range of motion to all extremities. No gross deformities appreciated. No gross deformities or edema noted to the right hand. All ecchymosis is noted to the dorsal and palmar aspect along the second metacarpal region. Patient is tender to palpation in this region but no palpable abnormality. Sensation intact 5 digits. Cap refill intact all digits. Full range of motion all digits right hand. Neurologic:  Normal speech and language. No gross focal neurologic deficits are appreciated.  Skin:  Skin is warm, dry and intact. No rash noted. Psychiatric: Mood and affect are normal. Speech and behavior are normal. Patient exhibits appropriate insight and judgement.   ____________________________________________   LABS (all labs ordered are listed, but only abnormal results are displayed)  Labs Reviewed - No data to display ____________________________________________  EKG   ____________________________________________  RADIOLOGY Festus Barren Cuthriell, personally viewed and evaluated these images (plain radiographs) as part of my medical decision making, as well as reviewing the written report by the radiologist.  Dg Hand Complete Right  Result Date: 05/14/2017 CLINICAL DATA:  36 year old female with right hand injury. EXAM: RIGHT HAND - COMPLETE  3+ VIEW COMPARISON:  Radiograph dated 07/24/2015 FINDINGS: There is no evidence of fracture or dislocation. There is no evidence of arthropathy or other focal bone abnormality. Soft tissues are unremarkable. IMPRESSION: Negative. Electronically Signed   By: Elgie Collard M.D.   On: 05/14/2017 21:26    ____________________________________________    PROCEDURES  Procedure(s) performed:    Procedures    Medications - No data to display   ____________________________________________   INITIAL IMPRESSION / ASSESSMENT AND PLAN / ED COURSE  Pertinent labs & imaging results that were available during my care of the patient were reviewed by me and considered in my medical decision making (see chart for details).  Review of the [place] CSRS was performed in accordance of the [facility] prior to dispensing any controlled drugs.     Patient's  diagnosis is consistent with right hand contusion. X-ray reveals no acute osseous 9. Exam is reassuring with no indication of acute ligamentous injury. No further workup deemed necessary at this time.. Patient will be discharged home with prescriptions for anti-inflammatories for symptom control. Patient is to follow up with primary care as needed or otherwise directed. Patient is given ED precautions to return to the ED for any worsening or new symptoms.     ____________________________________________  FINAL CLINICAL IMPRESSION(S) / ED DIAGNOSES  Final diagnoses:  Contusion of right hand, initial encounter      NEW MEDICATIONS STARTED DURING THIS VISIT:  New Prescriptions   MELOXICAM (MOBIC) 15 MG TABLET    Take 1 tablet (15 mg total) by mouth daily.        This chart was dictated using voice recognition software/Dragon. Despite best efforts to proofread, errors can occur which can change the meaning. Any change was purely unintentional.    Racheal Patches, PA-C 05/14/17 2157    Jeanmarie Plant, MD 05/14/17 [phone]

## 2017-05-14 NOTE — ED Notes (Signed)
Pt reports that she hit her right hand on a table 2 days ago and since then the thumb and pointer finger have started to swell and she is having pain putting any pressure on her hand

## 2017-05-14 NOTE — ED Triage Notes (Signed)
Pt arrived to the ED for complaints of right hand pain secondary to "hitting a table with her hand. Pt is AOx4 in no apparent distress, sensation, circulation and range of motion are intact.

## 2017-05-19 ENCOUNTER — Other Ambulatory Visit: Payer: Self-pay

## 2017-05-19 ENCOUNTER — Emergency Department: Payer: Self-pay

## 2017-05-19 ENCOUNTER — Emergency Department
Admission: EM | Admit: 2017-05-19 | Discharge: 2017-05-19 | Disposition: A | Discharge status: Home / Self Care | Payer: Self-pay | Attending: Emergency Medicine | Admitting: Emergency Medicine

## 2017-05-19 DIAGNOSIS — R079 Chest pain, unspecified: Secondary | ICD-10-CM | POA: Insufficient documentation

## 2017-05-19 HISTORY — DX: Cardiac murmur, unspecified: R01.1

## 2017-05-19 LAB — CBC
HCT: 39.1 % (ref 35.0–47.0)
Hemoglobin: 13.2 g/dL (ref 12.0–16.0)
MCH: 28.6 pg (ref 26.0–34.0)
MCHC: 33.8 g/dL (ref 32.0–36.0)
MCV: 84.6 fL (ref 80.0–100.0)
PLATELETS: 314 10*3/uL (ref 150–440)
RBC: 4.62 MIL/uL (ref 3.80–5.20)
RDW: 14.3 % (ref 11.5–14.5)
WBC: 9.7 10*3/uL (ref 3.6–11.0)

## 2017-05-19 LAB — BASIC METABOLIC PANEL
Anion gap: 7 (ref 5–15)
BUN: 9 mg/dL (ref 6–20)
CALCIUM: 8.9 mg/dL (ref 8.9–10.3)
CO2: 22 mmol/L (ref 22–32)
CREATININE: 0.73 mg/dL (ref 0.44–1.00)
Chloride: 106 mmol/L (ref 101–111)
GFR calc Af Amer: >60 mL/min (ref >60)
GFR calc non Af Amer: >60 mL/min (ref >60)
GLUCOSE: 88 mg/dL (ref 65–99)
Potassium: 3.6 mmol/L (ref 3.5–5.1)
Sodium: 135 mmol/L (ref 135–145)

## 2017-05-19 LAB — POCT PREGNANCY, URINE: PREG TEST UR: NEGATIVE

## 2017-05-19 LAB — FIBRIN DERIVATIVES D-DIMER (ARMC ONLY): Fibrin derivatives D-dimer (ARMC): 1085.77 — ABNORMAL HIGH (ref 0.00–499.00)

## 2017-05-19 LAB — TROPONIN I

## 2017-05-19 MED ORDER — IOPAMIDOL (ISOVUE-370) INJECTION 76%
75.0000 mL | Freq: Once | INTRAVENOUS | Status: AC | PRN
  Administered 2017-05-19: 75 mL via INTRAVENOUS
  Filled 2017-05-19: qty 75

## 2017-05-19 NOTE — Discharge Instructions (Signed)
You have been seen in the emergency department today for chest pain. Your workup has shown normal results. As we discussed please follow-up with your primary care physician in the next 1-2 days for recheck. Return to the emergency department for any further chest pain, trouble breathing, or any other symptom personally concerning to yourself. °

## 2017-05-19 NOTE — ED Notes (Signed)
Pt ambulatory to toilet to collect urine sample.  

## 2017-05-19 NOTE — ED Provider Notes (Signed)
[facility] Emergency Department Provider Note  Time seen: 4:05 PM  I have reviewed the triage vital signs and the nursing notes.   HISTORY  Chief Complaint Chest Pain    HPI Ann Taylor is a 36 y.o. female who presents to the emergency department for chest pain. According to the patient since last night she has been experiencing sudden chest pain which she describes as something pushing on her chest lasting seconds to a minute and then going away. States it happened twice last night while in bed, and happened again today so she came to the emergency department for evaluation. Denies any discomfort currently. Denies any leg pain or swelling. Denies any nausea or diaphoresis. She does states shortness of breath when the chest pain occurs.  Past Medical History:  Diagnosis Date  . Arrhythmia   . Headache(784.0)   . Heart murmur   . UTI (lower urinary tract infection)     There are no active problems to display for this patient.   Past Surgical History:  Procedure Laterality Date  . CESAREAN SECTION      Prior to Admission medications   Medication Sig Start Date End Date Taking? Authorizing Provider  azithromycin (ZITHROMAX) 250 MG tablet Take 1 tablet (250 mg total) by mouth daily. Take first 2 tablets together, then 1 every day until finished. Patient not taking: Reported on 10/24/2015 09/21/15   Elpidio Anis, PA-C  diphenhydrAMINE (BENADRYL) 25 mg capsule Take 25 mg by mouth every 8 (eight) hours as needed (for hives).    [provider]  fluticasone (FLONASE) 50 MCG/ACT nasal spray Place 2 sprays into both nostrils daily. 03/16/17 03/16/18  Enid Derry, PA-C  ibuprofen (ADVIL,MOTRIN) 800 MG tablet Take 1 tablet (800 mg total) by mouth every 8 (eight) hours as needed for mild pain or moderate pain. Patient not taking: Reported on 10/24/2015 05/24/15   Trixie Dredge, PA-C  lidocaine (XYLOCAINE) 2 % solution Use as directed 10 mLs in the  mouth or throat as needed for mouth pain. 03/16/17   Enid Derry, PA-C  meloxicam (MOBIC) 15 MG tablet Take 1 tablet (15 mg total) by mouth daily. 05/14/17   Cuthriell, Delorise Royals, PA-C  phenazopyridine (PYRIDIUM) 200 MG tablet Take 1 tablet (200 mg total) by mouth 3 (three) times daily as needed for pain. 03/30/17   Joni Reining, PA-C  sulfamethoxazole-trimethoprim (BACTRIM DS,SEPTRA DS) 800-160 MG tablet Take 1 tablet by mouth 2 (two) times daily. 03/30/17   Joni Reining, PA-C    Allergies  Allergen Reactions  . Shellfish Allergy Anaphylaxis  . Amoxicillin Hives and Swelling    Swelling of extremities    Family History  Problem Relation Age of Onset  . Alcohol abuse Neg Hx   . Arthritis Neg Hx   . Asthma Neg Hx   . Birth defects Neg Hx   . Cancer Neg Hx   . COPD Neg Hx   . Depression Neg Hx   . Diabetes Neg Hx   . Drug abuse Neg Hx   . Early death Neg Hx   . Hearing loss Neg Hx   . Heart disease Neg Hx   . Hyperlipidemia Neg Hx   . Hypertension Neg Hx   . Kidney disease Neg Hx   . Learning disabilities Neg Hx   . Mental illness Neg Hx   . Mental retardation Neg Hx   . Miscarriages / Stillbirths Neg Hx   . Stroke Neg Hx   .  Vision loss Neg Hx     Social History Social History  Substance Use Topics  . Smoking status: Never Smoker  . Smokeless tobacco: Never Used  . Alcohol use No    Review of Systems Constitutional: Negative for fever Cardiovascular: Positive chest pain, intermittently, none currently. Respiratory: Mild shortness breath when the chest pain occurs. Gastrointestinal: Negative for abdominal pain Musculoskeletal: No leg pain or swelling. Skin: Negative for rash. Neurological: Negative for headache All other ROS negative  ____________________________________________   PHYSICAL EXAM:  VITAL SIGNS: ED Triage Vitals  Enc Vitals Group     BP 05/19/17 1403 128/89     Pulse Rate 05/19/17 1403 80     Resp 05/19/17 1403 16     Temp 05/19/17  1403 98.4 F (36.9 C)     Temp Source 05/19/17 1403 Oral     SpO2 05/19/17 1403 100 %     Weight 05/19/17 1403 161 lb (73 kg)     Height 05/19/17 1403 5\' 5"  (1.651 m)     Head Circumference --      Peak Flow --      Pain Score 05/19/17 1409 10     Pain Loc --      Pain Edu? --      Excl. in GC? --     Constitutional: Alert and oriented. Well appearing and in no distress. Eyes: Normal exam ENT   Head: Normocephalic and atraumatic.   Mouth/Throat: Mucous membranes are moist. Cardiovascular: Normal rate, regular rhythm. No murmur Respiratory: Normal respiratory effort without tachypnea nor retractions. Breath sounds are clear. Mild sternal tenderness to palpation. Gastrointestinal: Soft and nontender. No distention.   Musculoskeletal: Nontender with normal range of motion in all extremities. No lower extremity tenderness or edema. Neurologic:  Normal speech and language. No gross focal neurologic deficits Skin:  Skin is warm, dry and intact.  Psychiatric: Mood and affect are normal.  ____________________________________________    EKG  EKG reviewed and interpreted by myself shows normal sinus rhythm at 94 bpm, narrow QRS, normal axis, normal intervals, no ST changes. Normal EKG.  ____________________________________________    RADIOLOGY  Chest x-ray negative CT angiography negative ____________________________________________   INITIAL IMPRESSION / ASSESSMENT AND PLAN / ED COURSE  Pertinent labs & imaging results that were available during my care of the patient were reviewed by me and considered in my medical decision making (see chart for details).  Patient presents to the emergency department for chest pain which is been intermittent since last night. Denies any pain currently. Patient's workup is largely normal. She does have mild sternal tenderness to palpation. Her labs are normal including negative troponin. EKG is normal. Chest x-ray is normal. Given the  patient's intermittent chest pain in the same location occurring since last night we'll obtain a d-dimer to help rule out PE. Patient is at very low risk of PE has nexplanon. No other risk factors. No pleuritic pain. No leg pain or swelling.  D-dimer is positive. We will obtain a CT angiography of the chest to rule out pulmonary embolism.  CT angiography of the chest is normal. Patient remains pain free in the emergency department. We will discharge home with cardiology follow-up.  ____________________________________________   FINAL CLINICAL IMPRESSION(S) / ED DIAGNOSES  Chest pain    Minna AntisPaduchowski, Sherron Mummert, MD 05/19/17 1729

## 2017-05-19 NOTE — ED Triage Notes (Signed)
Pt arrives to ED c/o of central CP that began last night. States she went to work this AM and pain didn't go away. Non-radiating. C/o SOB with it. Pt appears anxious. Pt denies any other symptoms. Alert, oriented, ambulatory.

## 2017-07-28 ENCOUNTER — Encounter: Payer: Self-pay | Admitting: Emergency Medicine

## 2017-07-28 ENCOUNTER — Emergency Department
Admission: EM | Admit: 2017-07-28 | Discharge: 2017-07-28 | Disposition: A | Discharge status: Home / Self Care | Payer: Self-pay | Attending: Emergency Medicine | Admitting: Emergency Medicine

## 2017-07-28 ENCOUNTER — Emergency Department: Payer: Self-pay

## 2017-07-28 DIAGNOSIS — Y929 Unspecified place or not applicable: Secondary | ICD-10-CM | POA: Insufficient documentation

## 2017-07-28 DIAGNOSIS — Y939 Activity, unspecified: Secondary | ICD-10-CM | POA: Insufficient documentation

## 2017-07-28 DIAGNOSIS — W109XXA Fall (on) (from) unspecified stairs and steps, initial encounter: Secondary | ICD-10-CM | POA: Insufficient documentation

## 2017-07-28 DIAGNOSIS — Y999 Unspecified external cause status: Secondary | ICD-10-CM | POA: Insufficient documentation

## 2017-07-28 DIAGNOSIS — S93401A Sprain of unspecified ligament of right ankle, initial encounter: Secondary | ICD-10-CM | POA: Insufficient documentation

## 2017-07-28 MED ORDER — IBUPROFEN 600 MG PO TABS
600.0000 mg | ORAL_TABLET | Freq: Once | ORAL | Status: AC
  Administered 2017-07-28: 600 mg via ORAL
  Filled 2017-07-28: qty 1

## 2017-07-28 MED ORDER — IBUPROFEN 600 MG PO TABS: 600.0000 mg | ORAL_TABLET | Freq: Three times a day (TID) | ORAL | 0 refills | Status: DC | PRN

## 2017-07-28 MED ORDER — TRAMADOL HCL 50 MG PO TABS: 50.0000 mg | ORAL_TABLET | Freq: Four times a day (QID) | ORAL | 0 refills | Status: DC | PRN

## 2017-07-28 NOTE — ED Notes (Signed)
See triage note  States she was throwing things out of her house  Missed a step  And twisted right ankle.  positive swelling noted   Good pulses and sensation

## 2017-07-28 NOTE — ED Triage Notes (Signed)
Patient presents to the ED with right ankle pain that began today.  Patient states she was going down steps, missed a step, and fell on her right ankle.  Patient states she is having difficulty bearing weight on her right leg.  Patient's right ankle appears slightly swollen.

## 2017-07-28 NOTE — ED Provider Notes (Signed)
[facility] Emergency Department Provider Note  ____________________________________________   First MD Initiated Contact with Patient 07/28/17 [phone]     (approximate)  I have reviewed the triage vital signs and the nursing notes.   HISTORY  Chief Complaint Ankle Pain   HPI Lolita Riegeratalie D Peckenpaugh is a 36 y.o. female presents to the emergency room with complaint of right ankle pain that began today after patient slipped on some steps causing her to fall. Patient says since that time is been difficult to bear weight on her right leg. She denies any previous injury to her ankle. Patient denies any head injury or loss of consciousness. She rates her pain as a 10 over 10.   Past Medical History:  Diagnosis Date  . Arrhythmia   . Headache(784.0)   . Heart murmur   . UTI (lower urinary tract infection)     There are no active problems to display for this patient.   Past Surgical History:  Procedure Laterality Date  . CESAREAN SECTION      Prior to Admission medications   Medication Sig Start Date End Date Taking? Authorizing Provider  diphenhydrAMINE (BENADRYL) 25 mg capsule Take 25 mg by mouth every 8 (eight) hours as needed (for hives).    [provider]  ibuprofen (ADVIL,MOTRIN) 600 MG tablet Take 1 tablet (600 mg total) by mouth every 8 (eight) hours as needed. 07/28/17   Tommi RumpsSummers, Rhonda L, PA-C  traMADol (ULTRAM) 50 MG tablet Take 1 tablet (50 mg total) by mouth every 6 (six) hours as needed for moderate pain. 07/28/17   Tommi RumpsSummers, Rhonda L, PA-C    Allergies Shellfish allergy and Amoxicillin  Family History  Problem Relation Age of Onset  . Alcohol abuse Neg Hx   . Arthritis Neg Hx   . Asthma Neg Hx   . Birth defects Neg Hx   . Cancer Neg Hx   . COPD Neg Hx   . Depression Neg Hx   . Diabetes Neg Hx   . Drug abuse Neg Hx   . Early death Neg Hx   . Hearing loss Neg Hx   . Heart disease Neg Hx   . Hyperlipidemia Neg Hx   . Hypertension  Neg Hx   . Kidney disease Neg Hx   . Learning disabilities Neg Hx   . Mental illness Neg Hx   . Mental retardation Neg Hx   . Miscarriages / Stillbirths Neg Hx   . Stroke Neg Hx   . Vision loss Neg Hx     Social History Social History  Substance Use Topics  . Smoking status: Never Smoker  . Smokeless tobacco: Never Used  . Alcohol use No    Review of Systems Constitutional: No fever/chills Eyes: No visual changes. ENT: No trauma Cardiovascular: Denies chest pain. Respiratory: Denies shortness of breath. Gastrointestinal: No abdominal pain.  No nausea, no vomiting.  Musculoskeletal: Positive right ankle pain. Skin: No abrasions or ecchymosis. Neurological: Negative for headaches, focal weakness or numbness. ____________________________________________   PHYSICAL EXAM:  VITAL SIGNS: ED Triage Vitals  Enc Vitals Group     BP 07/28/17 0714 121/77     Pulse Rate 07/28/17 0714 93     Resp 07/28/17 0714 16     Temp 07/28/17 0714 98.2 F (36.8 C)     Temp Source 07/28/17 0714 Oral     SpO2 07/28/17 0714 95 %     Weight 07/28/17 0710 165 lb (74.8 kg)  Height 07/28/17 0710 5\' 5"  (1.651 m)     Head Circumference --      Peak Flow --      Pain Score 07/28/17 0709 10     Pain Loc --      Pain Edu? --      Excl. in GC? --    Constitutional: Alert and oriented. Well appearing and in no acute distress. Eyes: Conjunctivae are normal.  Head: Atraumatic. Nose: No congestion/rhinnorhea. Neck: No stridor.   Cardiovascular: Normal rate, regular rhythm. Grossly normal heart sounds.  Good peripheral circulation. Respiratory: Normal respiratory effort.  No retractions. Lungs CTAB. Musculoskeletal: Examination of the right ankle there is moderate soft tissue swelling on the lateral aspect. Range of motion is restricted secondary to discomfort. Skin is intact. Pulse is present and motor sensory function intact. Patient is able to move digits distal to the injury without any  difficulty. Gait was not tested. Neurologic:  Normal speech and language. No gross focal neurologic deficits are appreciated.  Skin:  Skin is warm, dry and intact. No ecchymosis, abrasions, erythema present. Psychiatric: Mood and affect are normal. Speech and behavior are normal.  ____________________________________________   LABS (all labs ordered are listed, but only abnormal results are displayed)  Labs Reviewed - No data to display   RADIOLOGY  Dg Ankle Complete Right  Result Date: 07/28/2017 CLINICAL DATA:  Pt was throwing things out of her house and tripped off the step twisting her right ankle; she states she heard a pop; pain on lateral aspect shielded EXAM: RIGHT ANKLE - COMPLETE 3+ VIEW COMPARISON:  None. FINDINGS: Ankle mortise intact. The talar dome is normal. No malleolar fracture. The calcaneus is normal. IMPRESSION: No fracture or dislocation. Electronically Signed   By: Genevive Bi M.D.   On: 07/28/2017 07:48    ____________________________________________   PROCEDURES  Procedure(s) performed: None  Procedures  Critical Care performed: No  ____________________________________________   INITIAL IMPRESSION / ASSESSMENT AND PLAN / ED COURSE  Pertinent labs & imaging results that were available during my care of the patient were reviewed by me and considered in my medical decision making (see chart for details).  Patient was placed in a OCL stirup splint and given crutches. She is to ice and elevate to reduce swelling. She is given a prescription for ibuprofen 600 mg every 8 hours with food and tramadol if needed for moderate pain 1 every 6 hours as needed. She is follow-up with [facility] or Dr. Alberteen Spindle in podiatry if any continued problems. She was given a note to remain out of work.   _________________________________________   FINAL CLINICAL IMPRESSION(S) / ED DIAGNOSES  Final diagnoses:  Sprain of right ankle, unspecified ligament, initial  encounter      NEW MEDICATIONS STARTED DURING THIS VISIT:  Discharge Medication List as of 07/28/2017  8:07 AM    START taking these medications   Details  traMADol (ULTRAM) 50 MG tablet Take 1 tablet (50 mg total) by mouth every 6 (six) hours as needed for moderate pain., Starting Mon 07/28/2017, Print         Note:  This document was prepared using Dragon voice recognition software and may include unintentional dictation errors.    Tommi Rumps, PA-C 07/28/17 Libby Maw    Governor Rooks, MD 07/28/17 [phone]

## 2017-07-28 NOTE — Discharge Instructions (Signed)
Ice and elevate to reduce swelling.   Use crutches for support and wear ankle stirrup splint. Take ibuprofen as needed for inflammation and pain. Tramadol is for pain only and should not be taken while working or driving. Follow-[facility] with [facility]line [facility] if any continued problems. You will need to see your doctor or [facility] if more days are needed off from work as ED policy is to write for 2 days.

## 2017-08-04 ENCOUNTER — Emergency Department (HOSPITAL_COMMUNITY)
Admission: EM | Admit: 2017-08-04 | Discharge: 2017-08-04 | Disposition: A | Discharge status: Home / Self Care | Payer: Self-pay | Attending: Emergency Medicine | Admitting: Emergency Medicine

## 2017-08-04 ENCOUNTER — Encounter (HOSPITAL_COMMUNITY): Payer: Self-pay | Admitting: Emergency Medicine

## 2017-08-04 ENCOUNTER — Emergency Department (HOSPITAL_COMMUNITY): Payer: Self-pay

## 2017-08-04 DIAGNOSIS — S82891D Other fracture of right lower leg, subsequent encounter for closed fracture with routine healing: Secondary | ICD-10-CM | POA: Insufficient documentation

## 2017-08-04 DIAGNOSIS — S82891A Other fracture of right lower leg, initial encounter for closed fracture: Secondary | ICD-10-CM

## 2017-08-04 DIAGNOSIS — W109XXD Fall (on) (from) unspecified stairs and steps, subsequent encounter: Secondary | ICD-10-CM | POA: Insufficient documentation

## 2017-08-04 MED ORDER — HYDROCODONE-ACETAMINOPHEN 5-325 MG PO TABS: 1.0000 | ORAL_TABLET | Freq: Four times a day (QID) | ORAL | 0 refills | Status: DC | PRN

## 2017-08-04 NOTE — ED Triage Notes (Signed)
Patient is complaining of right ankle pain. Patient injured it on 07/28/2017. Patient states she is still having pain and it is swelling.

## 2017-08-04 NOTE — Discharge Instructions (Signed)
You have been seen today for an ankle injury. There is noted to be what is known as an avulsion fracture. Pain: Take 600 mg of ibuprofen every 6 hours or 440 mg (over the counter dose) to 500 mg (prescription dose) of naproxen every 12 hours for the next 3 days. After this time, these medications may be used as needed for pain. Take these medications with food to avoid upset stomach. Choose only one of these medications, do not take them together.  Tylenol: Should you continue to have additional pain while taking the ibuprofen or naproxen, you may add in tylenol as needed. Your daily total maximum amount of tylenol from all sources should be limited to /day for persons without liver problems, or /day for those with liver problems. Vicodin: May take the Vicodin as needed for severe pain. Do not drive or perform other dangerous activities while taking the Vicodin. Ice: May apply ice to the area over the next 24 hours for 15 minutes at a time to reduce swelling. Elevation: Keep the extremity elevated as often as possible to reduce pain and inflammation. Support: Wear the cam boot for support and comfort. Wear this until pain resolves. You will be weight-bearing as tolerated, which means you can slowly start to put weight on the extremity and increase amount and frequency as pain allows. Exercises: Start by performing these exercises a few times a week, increasing the frequency until you are performing them twice daily.  Follow up: Follow up with orthopedist on this matter. Call the number provided to set up an appointment.

## 2017-08-04 NOTE — ED Notes (Signed)
Discharge instructions reviewed with patient. Patient verbalizes understanding. VSS.   

## 2017-08-04 NOTE — ED Provider Notes (Signed)
[facility]-EMERGENCY DEPT Provider Note   CSN: [id number] Arrival date & time: 08/04/17  1918     History   Chief Complaint Chief Complaint  Patient presents with  . Ankle Pain    HPI Ann Taylor is a 36 y.o. female.  HPI   Ann Taylor is a 36 y.o. female, with a history of Heart murmur, presenting to the ED with Continued, unchanged right ankle pain following an injury on September 3. Patient was evaluated on September 3 and was told she had a negative ankle x-ray. She was given a removable ankle splint and crutches and recommended PCP follow-up for any further management. Patient endorses only using the crutches intermittently. Her current pain is rated 9/10, throbbing, nonradiating. Intermittent tingling in the right foot and ankle. She has been taking intermittent tramadol and ibuprofen with some relief, but states that her pain is not well controlled. Denies numbness, weakness, subsequent injury, or any other complaints.      Past Medical History:  Diagnosis Date  . Arrhythmia   . Headache(784.0)   . Heart murmur   . UTI (lower urinary tract infection)     There are no active problems to display for this patient.   Past Surgical History:  Procedure Laterality Date  . CESAREAN SECTION      OB History    Gravida Para Term Preterm AB Living   3 2 2  0 0 2   SAB TAB Ectopic Multiple Live Births   0 0 0 0         Home Medications    Prior to Admission medications   Medication Sig Start Date End Date Taking? Authorizing Provider  diphenhydrAMINE (BENADRYL) 25 mg capsule Take 25 mg by mouth every 8 (eight) hours as needed (for hives).    [provider]  HYDROcodone-acetaminophen (NORCO/VICODIN) 5-325 MG tablet Take 1 tablet by mouth every 6 (six) hours as needed for severe pain. 08/04/17   Saraia Platner C, PA-C  ibuprofen (ADVIL,MOTRIN) 600 MG tablet Take 1 tablet (600 mg total) by mouth every 8 (eight) hours as needed. 07/28/17   Tommi RumpsSummers, Rhonda L,  PA-C  traMADol (ULTRAM) 50 MG tablet Take 1 tablet (50 mg total) by mouth every 6 (six) hours as needed for moderate pain. 07/28/17   Tommi RumpsSummers, Rhonda L, PA-C    Family History Family History  Problem Relation Age of Onset  . Alcohol abuse Neg Hx   . Arthritis Neg Hx   . Asthma Neg Hx   . Birth defects Neg Hx   . Cancer Neg Hx   . COPD Neg Hx   . Depression Neg Hx   . Diabetes Neg Hx   . Drug abuse Neg Hx   . Early death Neg Hx   . Hearing loss Neg Hx   . Heart disease Neg Hx   . Hyperlipidemia Neg Hx   . Hypertension Neg Hx   . Kidney disease Neg Hx   . Learning disabilities Neg Hx   . Mental illness Neg Hx   . Mental retardation Neg Hx   . Miscarriages / Stillbirths Neg Hx   . Stroke Neg Hx   . Vision loss Neg Hx     Social History Social History  Substance Use Topics  . Smoking status: Never Smoker  . Smokeless tobacco: Never Used  . Alcohol use No     Allergies   Shellfish allergy and Amoxicillin   Review of Systems Review of Systems  Musculoskeletal: Positive  for arthralgias.  Neurological: Negative for weakness and numbness.     Physical Exam Updated Vital Signs BP (!) 147/107 (BP Location: Left Arm)   Pulse 90   Temp 98.3 F (36.8 C) (Oral)   Resp 18   Ht  (1.651 m)   Wt 74.8 kg (165 lb)   SpO2 99%   BMI 27.46 kg/m   Physical Exam  Constitutional: She appears well-developed and well-nourished. No distress.  HENT:  Head: Normocephalic and atraumatic.  Eyes: Conjunctivae are normal.  Neck: Neck supple.  Cardiovascular: Normal rate, regular rhythm and intact distal pulses.   Pulses:      Dorsalis pedis pulses are 2+ on the right side.       Posterior tibial pulses are 2+ on the right side.  Pulmonary/Chest: Effort normal.  Musculoskeletal: She exhibits tenderness.  Tenderness without significant swelling to the right lateral malleolus. No noted deformity, crepitus, or instability. Range of motion intact with pain.  Neurological: She is  alert.  No sensory deficits noted in the right lower extremity.  5/5 strength with right plantar and dorsiflexion.  Skin: Skin is warm and dry. Capillary refill takes less than 2 seconds. She is not diaphoretic. No pallor.  Psychiatric: She has a normal mood and affect. Her behavior is normal.  Nursing note and vitals reviewed.    ED Treatments / Results  Labs (all labs ordered are listed, but only abnormal results are displayed) Labs Reviewed - No data to display  EKG  EKG Interpretation None       Radiology Dg Ankle Complete Right  Result Date: 08/04/2017 CLINICAL DATA:  Patient fell off of a step and injured the right ankle last Monday. X-rays were done elsewhere. Pain is worsening. EXAM: RIGHT ANKLE - COMPLETE 3+ VIEW COMPARISON:  07/28/2017 from [facility]. FINDINGS: Tiny osseous fragment consistent with avulsion fracture off of the distal lateral malleolus. No other fracture or dislocation identified. No focal bone lesion or bone destruction. Small Achilles calcaneal spur. Soft tissues are unremarkable. IMPRESSION: Tiny avulsion fragment off of the distal lateral malleolus. Electronically Signed   By: Burman Nieves M.D.   On: 08/04/2017 21:56    Procedures Procedures (including critical care time)  Medications Ordered in ED Medications - No data to display   Initial Impression / Assessment and Plan / ED Course  I have reviewed the triage vital signs and the nursing notes.  Pertinent labs & imaging results that were available during my care of the patient were reviewed by me and considered in my medical decision making (see chart for details).     Patient presents with continued and persistent right ankle pain. Previous x-ray read as normal. Today's x-ray read notes a fibular avulsion fracture. Cam boot walker and orthopedic follow-up. Importance of using the crutches to avoid painful ambulation was stressed to the patient. The patient was given instructions for  home care as well as return precautions. Patient voices understanding of these instructions, accepts the plan, and is comfortable with discharge.   Final Clinical Impressions(s) / ED Diagnoses   Final diagnoses:  Closed avulsion fracture of right ankle, initial encounter    New Prescriptions New Prescriptions   HYDROCODONE-ACETAMINOPHEN (NORCO/VICODIN) 5-325 MG TABLET    Take 1 tablet by mouth every 6 (six) hours as needed for severe pain.     Anselm Pancoast, PA-C 08/04/17 2240    Lorre Nick, MD 08/07/17 1350

## 2017-09-08 ENCOUNTER — Emergency Department
Admission: EM | Admit: 2017-09-08 | Discharge: 2017-09-08 | Disposition: A | Discharge status: Home / Self Care | Payer: Self-pay | Attending: Emergency Medicine | Admitting: Emergency Medicine

## 2017-09-08 ENCOUNTER — Emergency Department: Payer: Self-pay

## 2017-09-08 DIAGNOSIS — M25571 Pain in right ankle and joints of right foot: Secondary | ICD-10-CM | POA: Insufficient documentation

## 2017-09-08 MED ORDER — NAPROXEN 500 MG PO TABS: 500.0000 mg | ORAL_TABLET | Freq: Two times a day (BID) | ORAL | Status: DC

## 2017-09-08 NOTE — ED Notes (Signed)
Patient is here for work note with restrictions due to her R foot so she can still work

## 2017-09-08 NOTE — ED Triage Notes (Signed)
Right ankle pain X 1 month; pt was dx with fracture approx 4 weeks ago. Has not seen ortho due to lack of insurance. Pt wearing boot, pain no better. Pt alert and oriented X4, active, cooperative, pt in NAD. RR even and unlabored, color WNL.

## 2017-09-08 NOTE — ED Provider Notes (Signed)
[facility] Emergency Department Provider Note   ____________________________________________   First MD Initiated Contact with Patient 09/08/17 1255     (approximate)  I have reviewed the triage vital signs and the nursing notes.   HISTORY  Chief Complaint Ankle Pain    HPI Ann Taylor is a 36 y.o. female patient complaining of continued right ankle pain status post 1 month diagnosed small avulsion fracture. Patient did not follow up with orthopedics secondary to lack of insurance.patient rates the pain as 8/10. Patient ambulates in a walking boot which was self purchase. Patient is requesting return to work evaluation.   Past Medical History:  Diagnosis Date  . Arrhythmia   . Headache(784.0)   . Heart murmur   . UTI (lower urinary tract infection)     There are no active problems to display for this patient.   Past Surgical History:  Procedure Laterality Date  . CESAREAN SECTION      Prior to Admission medications   Medication Sig Start Date End Date Taking? Authorizing Provider  diphenhydrAMINE (BENADRYL) 25 mg capsule Take 25 mg by mouth every 8 (eight) hours as needed (for hives).    [provider]  HYDROcodone-acetaminophen (NORCO/VICODIN) 5-325 MG tablet Take 1 tablet by mouth every 6 (six) hours as needed for severe pain. 08/04/17   Joy, Shawn C, PA-C  ibuprofen (ADVIL,MOTRIN) 600 MG tablet Take 1 tablet (600 mg total) by mouth every 8 (eight) hours as needed. 07/28/17   Tommi Rumps, PA-C  naproxen (NAPROSYN) 500 MG tablet Take 1 tablet (500 mg total) by mouth 2 (two) times daily with a meal. 09/08/17   Joni Reining, PA-C  traMADol (ULTRAM) 50 MG tablet Take 1 tablet (50 mg total) by mouth every 6 (six) hours as needed for moderate pain. 07/28/17   Tommi Rumps, PA-C    Allergies Shellfish allergy and Amoxicillin  Family History  Problem Relation Age of Onset  . Alcohol abuse Neg Hx   . Arthritis Neg Hx     . Asthma Neg Hx   . Birth defects Neg Hx   . Cancer Neg Hx   . COPD Neg Hx   . Depression Neg Hx   . Diabetes Neg Hx   . Drug abuse Neg Hx   . Early death Neg Hx   . Hearing loss Neg Hx   . Heart disease Neg Hx   . Hyperlipidemia Neg Hx   . Hypertension Neg Hx   . Kidney disease Neg Hx   . Learning disabilities Neg Hx   . Mental illness Neg Hx   . Mental retardation Neg Hx   . Miscarriages / Stillbirths Neg Hx   . Stroke Neg Hx   . Vision loss Neg Hx     Social History Social History  Substance Use Topics  . Smoking status: Never Smoker  . Smokeless tobacco: Never Used  . Alcohol use No    Review of Systems  Constitutional: No fever/chills Eyes: No visual changes. ENT: No sore throat. Cardiovascular: Denies chest pain. Respiratory: Denies shortness of breath. Gastrointestinal: No abdominal pain.  No nausea, no vomiting.  No diarrhea.  No constipation. Genitourinary: Negative for dysuria. Musculoskeletal:right ankle pain Skin: Negative for rash. Neurological: Negative for headaches, focal weakness or numbness. Allergic/Immunilogical: amoxicillin shellfish ____________________________________________   PHYSICAL EXAM:  VITAL SIGNS: ED Triage Vitals [09/08/17 1135]  Enc Vitals Group     BP (!) 147/97     Pulse Rate  86     Resp 18     Temp 98.5 F (36.9 C)     Temp Source Oral     SpO2 98 %     Weight 165 lb (74.8 kg)     Height      Head Circumference      Peak Flow      Pain Score 8     Pain Loc      Pain Edu?      Excl. in GC?     Constitutional: Alert and oriented. Well appearing and in no acute distress. Cardiovascular: Normal rate, regular rhythm. Grossly normal heart sounds.  Good peripheral circulation. Respiratory: Normal respiratory effort.  No retractions. Lungs CTAB. Musculoskeletal:no obvious deformity or edema to the lateral ankle.Neurologic:  Normal speech and language. No gross focal neurologic deficits are appreciated. No gait  instability. Skin:  Skin is warm, dry and intact. No rash noted. Psychiatric: Mood and affect are normal. Speech and behavior are normal.  ____________________________________________   LABS (all labs ordered are listed, but only abnormal results are displayed)  Labs Reviewed - No data to display ____________________________________________  EKG   ____________________________________________  RADIOLOGY  Dg Ankle Complete Right  Result Date: 09/08/2017 CLINICAL DATA:  A month of right ankle pain. Recently diagnosed with an acute fracture. Symptoms have persisted. EXAM: RIGHT ANKLE - COMPLETE 3+ VIEW COMPARISON:  Right ankle series of August 04, 2017 FINDINGS: The bones are subjectively adequately mineralized. The tiny avulsion fracture fragment from the lateral malleolus is again demonstrated. The ankle joint mortise is preserved. The talar dome is intact. The talus and calcaneus exhibit no acute abnormalities. The metatarsal bases are intact. IMPRESSION: Tiny avulsion fracture fragment from the distal aspect of the lateral malleolus. Mild soft tissue swelling is present but has improved. Electronically Signed   By: David  [place] M.D.   On: 09/08/2017 13:22   no acute findings on x-ray todayright ankle.  _____________________________________   PROCEDURES  Procedure(s) performed: None  Procedures  Critical Care performed: No  ____________________________________________   INITIAL IMPRESSION / ASSESSMENT AND PLAN / ED COURSE  As part of my medical decision making, I reviewed the following data within the electronic MEDICAL RECORD NUMBER    Right ankle pain secondary to small avulsion fracture 1 month ago. Discussed x-ray from the patient today. Advised patient to discontinue using a walking boot and at the minimum wear elastic ankle support.Patient given return to work and advised to take naproxen as needed.      ____________________________________________   FINAL  CLINICAL IMPRESSION(S) / ED DIAGNOSES  Final diagnoses:  Acute right ankle pain      NEW MEDICATIONS STARTED DURING THIS VISIT:  New Prescriptions   NAPROXEN (NAPROSYN) 500 MG TABLET    Take 1 tablet (500 mg total) by mouth 2 (two) times daily with a meal.     Note:  This document was prepared using Dragon voice recognition software and may include unintentional dictation errors.    Joni Reining, PA-C 09/08/17 1358    Jene Every, MD 09/08/17 [phone]

## 2018-02-05 ENCOUNTER — Encounter: Payer: Self-pay | Admitting: Emergency Medicine

## 2018-02-05 ENCOUNTER — Emergency Department
Admission: EM | Admit: 2018-02-05 | Discharge: 2018-02-05 | Disposition: A | Discharge status: Home / Self Care | Payer: Self-pay | Attending: Emergency Medicine | Admitting: Emergency Medicine

## 2018-02-05 DIAGNOSIS — J111 Influenza due to unidentified influenza virus with other respiratory manifestations: Secondary | ICD-10-CM

## 2018-02-05 DIAGNOSIS — J1189 Influenza due to unidentified influenza virus with other manifestations: Secondary | ICD-10-CM | POA: Insufficient documentation

## 2018-02-05 DIAGNOSIS — R69 Illness, unspecified: Secondary | ICD-10-CM

## 2018-02-05 DIAGNOSIS — Z79899 Other long term (current) drug therapy: Secondary | ICD-10-CM | POA: Insufficient documentation

## 2018-02-05 LAB — GROUP A STREP BY PCR: GROUP A STREP BY PCR: NOT DETECTED

## 2018-02-05 NOTE — Discharge Instructions (Signed)
Follow-up with your regular doctor if not better in 3-5 days or the acute care.  Return emergency department if you are worsening.  Drink plenty of fluids.  Buy over-the-counter TheraFlu use this.  Take Tylenol and ibuprofen as needed for fever and body aches.  There is not a cure for the flu.  It just takes time

## 2018-02-05 NOTE — ED Triage Notes (Signed)
Pt arrived with complaint of generalized body aches, fever, and nausea since Tuesday. Pt reports minimal relief with OTC medication treatment.

## 2018-02-05 NOTE — ED Provider Notes (Signed)
[facility] Emergency Department Provider Note  ____________________________________________   First MD Initiated Contact with Patient 02/05/18 1256     (approximate)  I have reviewed the triage vital signs and the nursing notes.   HISTORY  Chief Complaint Fever; Generalized Body Aches; and Nausea    HPI Lolita Riegeratalie D Albergo is a 37 y.o. female presents emergency department complaining of generalized body aches, cough and congestion with sore throat.  She states she has had a fever on and off since Tuesday.  But most of the symptoms started on Sunday.  She denies any burning with urination.  She denies chest pain or shortness of breath.  She denies vomiting or diarrhea.  She did have one episode of vomiting on Tuesday  Past Medical History:  Diagnosis Date  . Arrhythmia   . Headache(784.0)   . Heart murmur   . UTI (lower urinary tract infection)     There are no active problems to display for this patient.   Past Surgical History:  Procedure Laterality Date  . CESAREAN SECTION      Prior to Admission medications   Medication Sig Start Date End Date Taking? Authorizing Provider  diphenhydrAMINE (BENADRYL) 25 mg capsule Take 25 mg by mouth every 8 (eight) hours as needed (for hives).    [provider]    Allergies Shellfish allergy and Amoxicillin  Family History  Problem Relation Age of Onset  . Alcohol abuse Neg Hx   . Arthritis Neg Hx   . Asthma Neg Hx   . Birth defects Neg Hx   . Cancer Neg Hx   . COPD Neg Hx   . Depression Neg Hx   . Diabetes Neg Hx   . Drug abuse Neg Hx   . Early death Neg Hx   . Hearing loss Neg Hx   . Heart disease Neg Hx   . Hyperlipidemia Neg Hx   . Hypertension Neg Hx   . Kidney disease Neg Hx   . Learning disabilities Neg Hx   . Mental illness Neg Hx   . Mental retardation Neg Hx   . Miscarriages / Stillbirths Neg Hx   . Stroke Neg Hx   . Vision loss Neg Hx     Social History Social  History   Tobacco Use  . Smoking status: Never Smoker  . Smokeless tobacco: Never Used  Substance Use Topics  . Alcohol use: No  . Drug use: No    Review of Systems  Constitutional: Positive fever/chills Eyes: No visual changes. ENT:  positive sore throat. Respiratory: positive cough Genitourinary: Negative for dysuria. Musculoskeletal: Negative for back pain. Skin: Negative for rash.    ____________________________________________   PHYSICAL EXAM:  VITAL SIGNS: ED Triage Vitals  Enc Vitals Group     BP 02/05/18 1227 121/86     Pulse Rate 02/05/18 1227 (!) 108     Resp 02/05/18 1227 18     Temp 02/05/18 1227 99.1 F (37.3 C)     Temp Source 02/05/18 1227 Oral     SpO2 02/05/18 1227 100 %     Weight 02/05/18 1228 155 lb (70.3 kg)     Height 02/05/18 1228 5\' 5"  (1.651 m)     Head Circumference --      Peak Flow --      Pain Score 02/05/18 1227 5     Pain Loc --      Pain Edu? --      Excl. in  GC? --     Constitutional: Alert and oriented. Well appearing and in no acute distress. Eyes: Conjunctivae are normal.  Head: Atraumatic. Nose: No congestion/rhinnorhea. Mouth/Throat: Mucous membranes are moist.  throat is red with questionable exudate versus  tonsil with posteriorly Neck: Supple, no lymphadenopathy is noted Cardiovascular: Normal rate, regular rhythm.,  Heart sounds are normal patient is a 37 year old female complaining of flulike symptoms since Sunday Respiratory: Normal respiratory effort.  No retractions, lungs are clear to auscultation GU: deferred Musculoskeletal: FROM all extremities, warm and well perfused Neurologic:  Normal speech and language.  Skin:  Skin is warm, dry and intact. No rash noted. Psychiatric: Mood and affect are normal. Speech and behavior are normal.  ____________________________________________   LABS (all labs ordered are listed, but only abnormal results are displayed)  Labs Reviewed  GROUP A STREP BY PCR    ____________________________________________   ____________________________________________  RADIOLOGY    ____________________________________________   PROCEDURES  Procedure(s) performed: No  Procedures    ____________________________________________   INITIAL IMPRESSION / ASSESSMENT AND PLAN / ED COURSE  Pertinent labs & imaging results that were available during my care of the patient were reviewed by me and considered in my medical decision making (see chart for details).  Patient is 37 year old female presenting to the emergency department with flulike symptoms since Sunday.  On physical exam patient appears tired, throat is red and swollen.  Remainder the exam is negative  Strep test ordered   Strep is negative.  Test results were discussed with patient.  Explained to her that she just has flu.  She has been sick too many days to give her Tamiflu.  She is to take over-the-counter medications.  Drink plenty of fluids.  She is given a work note for the weekend.  She states she understands she will follow-up with her regular doctor if not better in 3-5 days.  She will return to emergency department if worsening  As part of my medical decision making, I reviewed the following data within the electronic MEDICAL RECORD NUMBER Nursing notes reviewed and incorporated, Labs reviewed strep negative, Notes from prior ED visits and [place] Controlled Substance Database  ____________________________________________   FINAL CLINICAL IMPRESSION(S) / ED DIAGNOSES  Final diagnoses:  Influenza-like illness      NEW MEDICATIONS STARTED DURING THIS VISIT:  Discharge Medication List as of 02/05/2018  2:56 PM       Note:  This document was prepared using Dragon voice recognition software and may include unintentional dictation errors.    Faythe Ghee, PA-C 02/05/18 1530    Jeanmarie Plant, MD 02/05/18 1540

## 2018-02-05 NOTE — ED Notes (Signed)
See triage note  Presents with generalized body aches cough and subjective fever..states she has been taking OTC meds for sx's   Gets min relief for about 2-3 hours.  Low grade fever noted on arival

## 2018-03-28 ENCOUNTER — Other Ambulatory Visit: Payer: Self-pay

## 2018-03-28 ENCOUNTER — Encounter: Payer: Self-pay | Admitting: Emergency Medicine

## 2018-03-28 ENCOUNTER — Emergency Department: Payer: Self-pay

## 2018-03-28 ENCOUNTER — Emergency Department
Admission: EM | Admit: 2018-03-28 | Discharge: 2018-03-28 | Disposition: A | Discharge status: Home / Self Care | Payer: Self-pay | Attending: Student in an Organized Health Care Education/Training Program | Admitting: Student in an Organized Health Care Education/Training Program

## 2018-03-28 DIAGNOSIS — L509 Urticaria, unspecified: Secondary | ICD-10-CM

## 2018-03-28 MED ORDER — HYDROXYZINE HCL 50 MG PO TABS: 50.0000 mg | ORAL_TABLET | Freq: Three times a day (TID) | ORAL | 0 refills | Status: DC | PRN

## 2018-03-28 MED ORDER — HYDROXYZINE HCL 50 MG PO TABS
50.0000 mg | ORAL_TABLET | Freq: Once | ORAL | Status: AC
  Administered 2018-03-28: 50 mg via ORAL
  Filled 2018-03-28: qty 1

## 2018-03-28 MED ORDER — METHYLPREDNISOLONE 4 MG PO TBPK: ORAL_TABLET | ORAL | 0 refills | Status: DC

## 2018-03-28 MED ORDER — DEXAMETHASONE SODIUM PHOSPHATE 10 MG/ML IJ SOLN
10.0000 mg | Freq: Once | INTRAMUSCULAR | Status: AC
  Administered 2018-03-28: 10 mg via INTRAMUSCULAR
  Filled 2018-03-28: qty 1

## 2018-03-28 NOTE — ED Provider Notes (Addendum)
[facility] Emergency Department Provider Note   ____________________________________________   First MD Initiated Contact with Patient 03/28/18 1053     (approximate)  I have reviewed the triage vital signs and the nursing notes.   HISTORY  Chief Complaint Rash    HPI Ann Taylor is a 37 y.o. female patient presents with diffuse urticaria and dysphagia.  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  Patient was seen by allergist approximately 10 years ago but is just tolerate the condition until she had trouble swallowing 2 days ago.  Patient state no relief of itching with over-the-counter Benadryl.  Patient states tried Zyrtec and Allegra in the past without remarkable relief.  Past Medical History:  Diagnosis Date  . Arrhythmia   . Headache(784.0)   . Heart murmur   . UTI (lower urinary tract infection)     There are no active problems to display for this patient.   Past Surgical History:  Procedure Laterality Date  . CESAREAN SECTION      Prior to Admission medications   Medication Sig Start Date End Date Taking? Authorizing Provider  diphenhydrAMINE (BENADRYL) 25 mg capsule Take 25 mg by mouth every 8 (eight) hours as needed (for hives).    [provider]  hydrOXYzine (ATARAX/VISTARIL) 50 MG tablet Take 1 tablet (50 mg total) by mouth 3 (three) times daily as needed. 03/28/18   Joni Reining, PA-C  methylPREDNISolone (MEDROL DOSEPAK) 4 MG TBPK tablet Take Tapered dose as directed 03/28/18   Joni Reining, PA-C    Allergies Shellfish allergy and Amoxicillin  Family History  Problem Relation Age of Onset  . Alcohol abuse Neg Hx   . Arthritis Neg Hx   . Asthma Neg Hx   . Birth defects Neg Hx   . Cancer Neg Hx   . COPD Neg Hx   . Depression Neg Hx   . Diabetes Neg Hx   . Drug abuse Neg Hx   . Early death Neg Hx   . Hearing loss Neg Hx   . Heart disease Neg Hx   . Hyperlipidemia Neg Hx   . Hypertension  Neg Hx   . Kidney disease Neg Hx   . Learning disabilities Neg Hx   . Mental illness Neg Hx   . Mental retardation Neg Hx   . Miscarriages / Stillbirths Neg Hx   . Stroke Neg Hx   . Vision loss Neg Hx     Social History Social History   Tobacco Use  . Smoking status: Never Smoker  . Smokeless tobacco: Never Used  Substance Use Topics  . Alcohol use: No  . Drug use: No    Review of Systems  Constitutional: No fever/chills Eyes: No visual changes. ENT: No sore throat. Cardiovascular: Denies chest pain. Respiratory: Denies shortness of breath. Gastrointestinal: No abdominal pain.  No nausea, no vomiting.  No diarrhea.  No constipation. Genitourinary: Negative for dysuria. Musculoskeletal: Negative for back pain. Skin: Positive for rash. Neurological: Negative for headaches, focal weakness or numbness.   ____________________________________________   PHYSICAL EXAM:  VITAL SIGNS: ED Triage Vitals  Enc Vitals Group     BP 03/28/18 1032 (!) 150/88     Pulse Rate 03/28/18 1032 (!) 112     Resp 03/28/18 1032 16     Temp 03/28/18 1032 98.6 F (37 C)     Temp Source 03/28/18 1032 Oral     SpO2 03/28/18 1032 98 %  Weight 03/28/18 1031 160 lb (72.6 kg)     Height 03/28/18 1031  (1.651 m)     Head Circumference --      Peak Flow --      Pain Score 03/28/18 1031 0     Pain Loc --      Pain Edu? --      Excl. in GC? --    Constitutional: Alert and oriented. Well appearing and in no acute distress. Eyes: Conjunctivae are normal. PERRL. EOMI. Head: Atraumatic. Nose: No congestion/rhinnorhea. Mouth/Throat: Mucous membranes are moist.  Oropharynx erythematous. Neck: No stridor.  Hematological/Lymphatic/Immunilogical: No cervical lymphadenopathy. Cardiovascular: Cardiac at 112., regular rhythm. Grossly normal heart sounds.  Good peripheral circulation. Respiratory: Normal respiratory effort.  No retractions. Lungs CTAB. Musculoskeletal: No lower extremity  tenderness nor edema.  No joint effusions. Neurologic:  Normal speech and language. No gross focal neurologic deficits are appreciated. No gait instability. Skin:  Skin is warm, dry and intact.   Diffuse macular lesions. Psychiatric: Mood and affect are normal. Speech and behavior are normal.  ____________________________________________   LABS (all labs ordered are listed, but only abnormal results are displayed)  Labs Reviewed - No data to display ____________________________________________  EKG   ____________________________________________  RADIOLOGY  No acute findings on soft tissue neck x-ray.  Official radiology report(s): Dg Neck Soft Tissue  Result Date: 03/28/2018 CLINICAL DATA:  37 year old female with a history of dysphagia EXAM: NECK SOFT TISSUES - 1+ VIEW COMPARISON:  07/14/2011 FINDINGS: Cervical vertebral elements maintain alignment. No fracture line identified. No significant degenerative changes. Prevertebral soft tissues unremarkable. No radiopaque foreign body. Tracheal air column unremarkable. Unremarkable appearance of the epiglottis and the soft tissues of the tongue base. Anterior view of the soft tissues unremarkable, with unremarkable appearance of the air column. Partially edentulous maxillary teeth. IMPRESSION: No acute finding of the soft tissue neck. Electronically Signed   By: Gilmer Mor D.O.   On: 03/28/2018 11:30    ____________________________________________   PROCEDURES  Procedure(s) performed: None  Procedures  Critical Care performed: No  ____________________________________________   INITIAL IMPRESSION / ASSESSMENT AND PLAN / ED COURSE  As part of my medical decision making, I reviewed the following data within the electronic MEDICAL RECORD NUMBER    Chronic urticaria.  Patient given discharge care instructions.  Patient given Decadron and and Atarax prior to departure.  Patient advised take medication as directed and follow-up with  Dr. Genevive Bi by calling for an appointment in 2 days.      ____________________________________________   FINAL CLINICAL IMPRESSION(S) / ED DIAGNOSES  Final diagnoses:  Urticaria     ED Discharge Orders        Ordered    methylPREDNISolone (MEDROL DOSEPAK) 4 MG TBPK tablet     03/28/18 1154    hydrOXYzine (ATARAX/VISTARIL) 50 MG tablet  3 times daily PRN     03/28/18 1154       Note:  This document was prepared using Dragon voice recognition software and may include unintentional dictation errors.    Joni Reining, PA-C 03/28/18 1157    Joni Reining, PA-C 03/28/18 1158    Willy Eddy, MD 03/28/18 [phone]

## 2018-03-28 NOTE — Discharge Instructions (Addendum)
Take medication as directed and follow-up with Dr. Elenore Rota by calling for an appointment on Monday morning.

## 2018-03-28 NOTE — ED Triage Notes (Signed)
C/O hive rash to body.  Patient has history of same, ongoing for 10 years.  Arrivers today with concerns to deal with itching.

## 2018-03-28 NOTE — ED Notes (Addendum)
Pt states that rash has been going on for 10 years. Pt states she has not seen her allergist for 10 years. Pt states that most medication such as benadryl and claritin do not help with allergies. Pt has hives and is itching over entirety of body.No N/V.

## 2018-03-28 NOTE — ED Notes (Signed)
First Nurse Note: Pt to ED c/o possible allergic reaction. Pt in NAD at this time.

## 2021-05-16 ENCOUNTER — Ambulatory Visit (HOSPITAL_COMMUNITY)
Admission: EM | Admit: 2021-05-16 | Discharge: 2021-05-16 | Disposition: A | Discharge status: Home / Self Care | Payer: Self-pay | Attending: Student | Admitting: Student

## 2021-05-16 ENCOUNTER — Encounter (HOSPITAL_COMMUNITY): Payer: Self-pay

## 2021-05-16 ENCOUNTER — Other Ambulatory Visit: Payer: Self-pay

## 2021-05-16 ENCOUNTER — Ambulatory Visit (INDEPENDENT_AMBULATORY_CARE_PROVIDER_SITE_OTHER): Payer: Self-pay

## 2021-05-16 DIAGNOSIS — M25531 Pain in right wrist: Secondary | ICD-10-CM

## 2021-05-16 DIAGNOSIS — M659 Synovitis and tenosynovitis, unspecified: Secondary | ICD-10-CM

## 2021-05-16 MED ORDER — PREDNISONE 20 MG PO TABS: 40.0000 mg | ORAL_TABLET | Freq: Every day | ORAL | 0 refills | Status: AC

## 2021-05-16 NOTE — ED Provider Notes (Signed)
[facility]    CSN: [id number] Arrival date & time: 05/16/21  1228      History   Chief Complaint Chief Complaint  Patient presents with   Wrist Injury    HPI Ann Taylor is a 40 y.o. female presenting with R wrist injury. Medical history noncontributory.  Vague history of possibly hitting her right wrist on something, she is not sure if and when this happened.  States that she had right wrist right pain for about 1 week, with some pins-and-needles over the dorsal wrist.  Pain with movement.  States she has been using her left hand more often due to this discomfort.  Denies injury elsewhere.  HPI  Past Medical History:  Diagnosis Date   Arrhythmia    Headache(784.0)    Heart murmur    UTI (lower urinary tract infection)     There are no problems to display for this patient.   Past Surgical History:  Procedure Laterality Date   CESAREAN SECTION      OB History     Gravida  3   Para  2   Term  2   Preterm  0   AB  0   Living  2      SAB  0   IAB  0   Ectopic  0   Multiple  0   Live Births               Home Medications    Prior to Admission medications   Medication Sig Start Date End Date Taking? Authorizing Provider  predniSONE (DELTASONE) 20 MG tablet Take 2 tablets (40 mg total) by mouth daily for 5 days. 05/16/21 05/21/21 Yes Rhys Martini, PA-C  diphenhydrAMINE (BENADRYL) 25 mg capsule Take 25 mg by mouth every 8 (eight) hours as needed (for hives).    [provider]  hydrOXYzine (ATARAX/VISTARIL) 50 MG tablet Take 1 tablet (50 mg total) by mouth 3 (three) times daily as needed. 03/28/18   Joni Reining, PA-C  methylPREDNISolone (MEDROL DOSEPAK) 4 MG TBPK tablet Take Tapered dose as directed 03/28/18   Joni Reining, PA-C    Family History Family History  Problem Relation Age of Onset   Alcohol abuse Neg Hx    Arthritis Neg Hx    Asthma Neg Hx    Birth defects Neg Hx    Cancer Neg Hx    COPD Neg Hx     Depression Neg Hx    Diabetes Neg Hx    Drug abuse Neg Hx    Early death Neg Hx    Hearing loss Neg Hx    Heart disease Neg Hx    Hyperlipidemia Neg Hx    Hypertension Neg Hx    Kidney disease Neg Hx    Learning disabilities Neg Hx    Mental illness Neg Hx    Mental retardation Neg Hx    Miscarriages / Stillbirths Neg Hx    Stroke Neg Hx    Vision loss Neg Hx     Social History Social History   Tobacco Use   Smoking status: Never   Smokeless tobacco: Never  Substance Use Topics   Alcohol use: No   Drug use: No     Allergies   Shellfish allergy and Amoxicillin   Review of Systems Review of Systems  Musculoskeletal:        R wrist pain    Physical Exam Triage Vital Signs ED Triage Vitals  Enc Vitals Group     BP      Pulse      Resp      Temp      Temp src      SpO2      Weight      Height      Head Circumference      Peak Flow      Pain Score      Pain Loc      Pain Edu?      Excl. in GC?    No data found.  Updated Vital Signs BP 100/74 (BP Location: Left Arm)   Pulse 97   Temp 98.5 F (36.9 C) (Oral)   Resp 17   LMP 05/08/2021 (Exact Date)   SpO2 100%   Visual Acuity Right Eye Distance:   Left Eye Distance:   Bilateral Distance:    Right Eye Near:   Left Eye Near:    Bilateral Near:     Physical Exam Vitals reviewed.  Constitutional:      General: She is not in acute distress.    Appearance: Normal appearance. She is not ill-appearing or diaphoretic.  HENT:     Head: Normocephalic and atraumatic.  Cardiovascular:     Rate and Rhythm: Normal rate and regular rhythm.     Heart sounds: Normal heart sounds.  Pulmonary:     Effort: Pulmonary effort is normal.     Breath sounds: Normal breath sounds.  Musculoskeletal:     Comments: R wrist is diffusely tender, worse with movement. No effusion or obvious bony deformity. No snuffbox tenderness. Sensation intact. ROM intact but with pain. Grip strength 5/5, cap refill <2 seconds,  radial pulse 2+. Positive finkelstein, negative phalen sign.  Skin:    General: Skin is warm.  Neurological:     General: No focal deficit present.     Mental Status: She is alert and oriented to person, place, and time.  Psychiatric:        Mood and Affect: Mood normal.        Behavior: Behavior normal.        Thought Content: Thought content normal.        Judgment: Judgment normal.     UC Treatments / Results  Labs (all labs ordered are listed, but only abnormal results are displayed) Labs Reviewed - No data to display  EKG   Radiology DG Wrist Complete Right  Result Date: 05/16/2021 CLINICAL DATA:  Wrist pain.  No known injury. EXAM: RIGHT WRIST - COMPLETE 3+ VIEW COMPARISON:  Hand radiograph 05/14/2017 FINDINGS: The joint spaces are maintained. No degenerative changes, erosive findings or chondrocalcinosis. No fracture or bone lesion. IMPRESSION: Normal wrist radiographs. Electronically Signed   By: Rudie Meyer M.D.   On: 05/16/2021 14:22    Procedures Procedures (including critical care time)  Medications Ordered in UC Medications - No data to display  Initial Impression / Assessment and Plan / UC Course  I have reviewed the triage vital signs and the nursing notes.  Pertinent labs & imaging results that were available during my care of the patient were reviewed by me and considered in my medical decision making (see chart for details).     This patient is a 40 year old female presenting with R wrist pain. Suspect symptoms are partially related to DeQuervain's tenosynovitis. Xray R wrist- negative. Wrist brace provided. RICE. F/u with ortho if symptoms persist.   Final Clinical Impressions(s) / UC Diagnoses  Final diagnoses:  Tenosynovitis of right wrist     Discharge Instructions      -Prednisone, 2 pills taken at the same time for 5 days in a row.  Try taking this earlier in the day as it can give you energy. Avoid NSAIDs like ibuprofen and alleve while  taking this medication as they can increase your risk of stomach upset and even GI bleeding when in combination with a steroid. You can continue tylenol (acetaminophen) up to 1000mg  3x daily. -Use your wrist brace throughout the day  -Follow-up with ortho if symptoms persist in about 5 days, information below. You can schedule an appointment online or go to their walk in clinic Monday-Friday.     ED Prescriptions     Medication Sig Dispense Auth. Provider   predniSONE (DELTASONE) 20 MG tablet Take 2 tablets (40 mg total) by mouth daily for 5 days. 10 tablet , PA-C      PDMP not reviewed this encounter.   Rhys Martini, PA-C 05/16/21 1441

## 2021-05-16 NOTE — Discharge Instructions (Addendum)
-  Prednisone, 2 pills taken at the same time for 5 days in a row.  Try taking this earlier in the day as it can give you energy. Avoid NSAIDs like ibuprofen and alleve while taking this medication as they can increase your risk of stomach upset and even GI bleeding when in combination with a steroid. You can continue tylenol (acetaminophen) up to 1000mg  3x daily. -Use your wrist brace throughout the day  -Follow-up with ortho if symptoms persist in about 5 days, information below. You can schedule an appointment online or go to their walk in clinic Monday-Friday.

## 2021-05-16 NOTE — ED Triage Notes (Signed)
Pt presents with a right wrist injury. Pt states she injured her wrist last week and states she has not been able to apply pressure to her wrist.

## 2021-05-16 NOTE — ED Notes (Signed)
No visible swelling.  Patient reports hitting right hand/wrist on something 2 weeks ago, but cannot remember specifics.  Patient holds fingers in a neutral position, states moving wrist causes pain in posterior wrist.  Has a pulling feeling when attempting to move wrist and/or fingers.  Patient is able to move fingers

## 2021-05-16 NOTE — ED Notes (Signed)
Spoke to [employer], pa about complaint and xray order

## 2021-06-19 ENCOUNTER — Ambulatory Visit: Payer: Self-pay | Admitting: Family Medicine

## 2021-07-13 ENCOUNTER — Encounter: Payer: Self-pay | Admitting: Obstetrics

## 2021-07-13 ENCOUNTER — Ambulatory Visit (INDEPENDENT_AMBULATORY_CARE_PROVIDER_SITE_OTHER): Payer: Self-pay | Admitting: Obstetrics

## 2021-07-13 ENCOUNTER — Other Ambulatory Visit: Payer: Self-pay

## 2021-07-13 ENCOUNTER — Other Ambulatory Visit (HOSPITAL_COMMUNITY)
Admission: RE | Admit: 2021-07-13 | Discharge: 2021-07-13 | Disposition: A | Discharge status: Home / Self Care | Payer: Self-pay | Source: Ambulatory Visit | Attending: Obstetrics | Admitting: Obstetrics

## 2021-07-13 VITALS — BP 146/83 | HR 85 | Wt 186.0 lb

## 2021-07-13 DIAGNOSIS — N898 Other specified noninflammatory disorders of vagina: Secondary | ICD-10-CM

## 2021-07-13 DIAGNOSIS — E669 Obesity, unspecified: Secondary | ICD-10-CM

## 2021-07-13 DIAGNOSIS — Z0000 Encounter for general adult medical examination without abnormal findings: Secondary | ICD-10-CM

## 2021-07-13 DIAGNOSIS — I10 Essential (primary) hypertension: Secondary | ICD-10-CM

## 2021-07-13 DIAGNOSIS — Z01419 Encounter for gynecological examination (general) (routine) without abnormal findings: Secondary | ICD-10-CM

## 2021-07-13 DIAGNOSIS — Z3009 Encounter for other general counseling and advice on contraception: Secondary | ICD-10-CM

## 2021-07-13 MED ORDER — TRIAMTERENE-HCTZ 37.5-25 MG PO CAPS: 1.0000 | ORAL_CAPSULE | Freq: Every day | ORAL | 11 refills | Status: DC

## 2021-07-13 NOTE — Progress Notes (Signed)
NGYN pt presents for annual, pap, wants Nexplanon, and STD testing.  Pt needs PCP referral. Pt requests rx of BP meds until she find a family doctor.  Normal pap last year per pt

## 2021-07-13 NOTE — Progress Notes (Signed)
Subjective:        Ann Taylor is a 40 y.o. female here for a routine exam.  Current complaints: Vaginal discharge.    Personal health questionnaire:  Is patient Ashkenazi Jewish, have a family history of breast and/or ovarian cancer: no Is there a family history of uterine cancer diagnosed at age < 40, gastrointestinal cancer, urinary tract cancer, family member who is a Personnel officer syndrome-associated carrier: no Is the patient overweight and hypertensive, family history of diabetes, personal history of gestational diabetes, preeclampsia or PCOS: no Is patient over 71, have PCOS,  family history of premature CHD under age 63, diabetes, smoke, have hypertension or peripheral artery disease:  no At any time, has a partner hit, kicked or otherwise hurt or frightened you?: no Over the past 2 weeks, have you felt down, depressed or hopeless?: no Over the past 2 weeks, have you felt little interest or pleasure in doing things?:no   Gynecologic History No LMP recorded. Patient has had an implant. Contraception: condoms Last Pap: 2021. Results were: normal Last mammogram: n/a. Results were: n/a  Obstetric History OB History  Gravida Para Term Preterm AB Living  3 2 2  0 0 2  SAB IAB Ectopic Multiple Live Births  0 0 0 0      # Outcome Date GA Lbr Len/2nd Weight Sex Delivery Anes PTL Lv  3 Gravida              Birth Comments: System Generated. Please review and update pregnancy details.  2 Term           1 Term             Past Medical History:  Diagnosis Date   Arrhythmia    Headache(784.0)    Heart murmur    UTI (lower urinary tract infection)     Past Surgical History:  Procedure Laterality Date   CESAREAN SECTION       Current Outpatient Medications:    TRIAMTERENE PO, Take by mouth., Disp: , Rfl:    diphenhydrAMINE (BENADRYL) 25 mg capsule, Take 25 mg by mouth every 8 (eight) hours as needed (for hives)., Disp: , Rfl:    hydrOXYzine (ATARAX/VISTARIL) 50 MG tablet,  Take 1 tablet (50 mg total) by mouth 3 (three) times daily as needed., Disp: 30 tablet, Rfl: 0   methylPREDNISolone (MEDROL DOSEPAK) 4 MG TBPK tablet, Take Tapered dose as directed, Disp: 21 tablet, Rfl: 0 Allergies  Allergen Reactions   Shellfish Allergy Anaphylaxis   Amoxicillin Hives and Swelling    Swelling of extremities    Social History   Tobacco Use   Smoking status: Never   Smokeless tobacco: Never  Substance Use Topics   Alcohol use: No    Family History  Problem Relation Age of Onset   Alcohol abuse Neg Hx    Arthritis Neg Hx    Asthma Neg Hx    Birth defects Neg Hx    Cancer Neg Hx    COPD Neg Hx    Depression Neg Hx    Diabetes Neg Hx    Drug abuse Neg Hx    Early death Neg Hx    Hearing loss Neg Hx    Heart disease Neg Hx    Hyperlipidemia Neg Hx    Hypertension Neg Hx    Kidney disease Neg Hx    Learning disabilities Neg Hx    Mental illness Neg Hx    Mental retardation Neg Hx  Miscarriages / Stillbirths Neg Hx    Stroke Neg Hx    Vision loss Neg Hx       Review of Systems  Constitutional: negative for fatigue and weight loss Respiratory: negative for cough and wheezing Cardiovascular: negative for chest pain, fatigue and palpitations Gastrointestinal: negative for abdominal pain and change in bowel habits Musculoskeletal:negative for myalgias Neurological: negative for gait problems and tremors Behavioral/Psych: negative for abusive relationship, depression Endocrine: negative for temperature intolerance    Genitourinary: positive for vaginal discharge.  negative for abnormal menstrual periods, genital lesions, hot flashes, sexual problems  Integument/breast: negative for breast lump, breast tenderness, nipple discharge and skin lesion(s)    Objective:       BP (!) 146/83   Pulse 85   Wt 186 lb (84.4 kg)   BMI 30.95 kg/m  General:   Alert and no distress  Skin:   no rash or abnormalities  Lungs:   clear to auscultation bilaterally   Heart:   regular rate and rhythm, S1, S2 normal, no murmur, click, rub or gallop  Breasts:   normal without suspicious masses, skin or nipple changes or axillary nodes  Abdomen:  normal findings: no organomegaly, soft, non-tender and no hernia  Pelvis:  External genitalia: normal general appearance Urinary system: urethral meatus normal and bladder without fullness, nontender Vaginal: normal without tenderness, induration or masses Cervix: normal appearance Adnexa: normal bimanual exam Uterus: anteverted and non-tender, normal size   Lab Review Urine pregnancy test Labs reviewed yes Radiologic studies reviewed no  I have spent a total of 20 minutes of face-to-face time, excluding clinical staff time, reviewing notes and preparing to see patient, ordering tests and/or medications, and counseling the patient.   Assessment:    1. Encounter for routine gynecological examination with Papanicolaou smear of cervix Rx: - Cytology - PAP( [facility])  2. Vaginal discharge Rx: - Cervicovaginal ancillary only( )  3. Encounter for counseling regarding contraception - wants Nexplanon  4. HTN (hypertension), benign Rx: - triamterene-hydrochlorothiazide (DYAZIDE) 37.5-25 MG capsule; Take 1 each (1 capsule total) by mouth daily.  Dispense: 30 capsule; Refill: 11  5. Obesity (BMI 30.0-34.9) - weight reduction with the aid of dietary changes, exercise and behavioral modification  6. Routine adult health maintenance Rx: - Ambulatory referral to Internal Medicine      Plan:    Education reviewed: calcium supplements, depression evaluation, low fat, low cholesterol diet, safe sex/STD prevention, self breast exams, and weight bearing exercise. Contraception: Nexplanon. Follow up in: 1 year.   Follow up in office for Nexplanon prn      Brock Bad, MD 07/13/2021 10:46 AM

## 2021-07-16 LAB — CERVICOVAGINAL ANCILLARY ONLY
Bacterial Vaginitis (gardnerella): POSITIVE — AB
Candida Glabrata: NEGATIVE
Candida Vaginitis: NEGATIVE
Chlamydia: NEGATIVE
Comment: NEGATIVE
Comment: NEGATIVE
Comment: NEGATIVE
Comment: NEGATIVE
Comment: NEGATIVE
Comment: NORMAL
Neisseria Gonorrhea: NEGATIVE
Trichomonas: NEGATIVE

## 2021-07-18 ENCOUNTER — Other Ambulatory Visit: Payer: Self-pay | Admitting: Obstetrics

## 2021-07-18 DIAGNOSIS — B9689 Other specified bacterial agents as the cause of diseases classified elsewhere: Secondary | ICD-10-CM

## 2021-07-18 LAB — CYTOLOGY - PAP
Comment: NEGATIVE
Diagnosis: NEGATIVE
High risk HPV: NEGATIVE

## 2021-07-18 MED ORDER — METRONIDAZOLE 500 MG PO TABS: 500.0000 mg | ORAL_TABLET | Freq: Two times a day (BID) | ORAL | 2 refills | Status: DC

## 2021-07-25 ENCOUNTER — Ambulatory Visit: Payer: Self-pay | Admitting: Obstetrics

## 2021-07-26 DIAGNOSIS — Z91018 Allergy to other foods: Secondary | ICD-10-CM | POA: Insufficient documentation

## 2021-07-26 DIAGNOSIS — G43909 Migraine, unspecified, not intractable, without status migrainosus: Secondary | ICD-10-CM | POA: Insufficient documentation

## 2021-07-28 DIAGNOSIS — R6889 Other general symptoms and signs: Secondary | ICD-10-CM | POA: Insufficient documentation

## 2021-07-28 DIAGNOSIS — T7849XA Other allergy, initial encounter: Secondary | ICD-10-CM | POA: Insufficient documentation

## 2021-07-28 DIAGNOSIS — G629 Polyneuropathy, unspecified: Secondary | ICD-10-CM | POA: Insufficient documentation

## 2021-07-28 DIAGNOSIS — I10 Essential (primary) hypertension: Secondary | ICD-10-CM | POA: Insufficient documentation

## 2021-07-28 DIAGNOSIS — Z832 Family history of diseases of the blood and blood-forming organs and certain disorders involving the immune mechanism: Secondary | ICD-10-CM | POA: Insufficient documentation

## 2021-08-06 ENCOUNTER — Encounter: Payer: Self-pay | Admitting: Neurology

## 2021-08-08 ENCOUNTER — Other Ambulatory Visit: Payer: Self-pay

## 2021-08-08 ENCOUNTER — Ambulatory Visit (INDEPENDENT_AMBULATORY_CARE_PROVIDER_SITE_OTHER): Payer: 59 | Admitting: Obstetrics

## 2021-08-08 ENCOUNTER — Encounter: Payer: Self-pay | Admitting: Obstetrics

## 2021-08-08 VITALS — BP 114/76 | HR 82 | Wt 180.0 lb

## 2021-08-08 DIAGNOSIS — Z30017 Encounter for initial prescription of implantable subdermal contraceptive: Secondary | ICD-10-CM

## 2021-08-08 LAB — POCT URINE PREGNANCY: Preg Test, Ur: NEGATIVE

## 2021-08-08 MED ORDER — ETONOGESTREL 68 MG ~~LOC~~ IMPL
68.0000 mg | DRUG_IMPLANT | Freq: Once | SUBCUTANEOUS | Status: AC
  Administered 2021-08-08: 68 mg via SUBCUTANEOUS

## 2021-08-08 NOTE — Progress Notes (Signed)
Nexplanon Procedure Note   PRE-OP DIAGNOSIS: Desired Long-Acting, Reversible Contraception ( LARC ) POST-OP DIAGNOSIS: Same  PROCEDURE: Nexplanon  Insertion Performing Provider: Bing Neighbors. Clearance Coots MD  Patient education prior to procedure, explained risk, benefits of Nexplanon, reviewed alternative options. Patient reported understanding. Gave consent to continue with procedure.   PROCEDURE:  Pregnancy Text :  Negative Site (check):      left arm         Sterile Preparation:   Hibiclens-Alcoholx3 Lot #:  [id number] Expiration Date:  2024 JUL 03  Insertion site was selected 8 - 10 cm from medial epicondyle and marked along with guiding site using sterile marker. Procedure area was prepped and draped in a sterile fashion. 1% Lidocaine 1.5 ml given prior to procedure. Nexplanon  was inserted subcutaneously.Needle was removed from the insertion site. Nexplanon capsule was palpated by provider and patient to assure satisfactory placement. Dressing applied.  Followup: The patient tolerated the procedure well without complications.  Standard post-procedure care is explained and return precautions are given.  Brock Bad, MD 08/08/2021 11:10 AM

## 2021-08-08 NOTE — Progress Notes (Signed)
RGYN patient presents for Nexplanon insertion.  LMP:07/26/21 Pt  denies  any unprotected intercourse in the last 14 days this time.   *shellfish allergy.

## 2021-08-22 ENCOUNTER — Ambulatory Visit: Payer: 59 | Admitting: Obstetrics
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
## 2021-10-31 ENCOUNTER — Other Ambulatory Visit: Payer: Self-pay

## 2021-10-31 ENCOUNTER — Encounter: Payer: Self-pay | Admitting: Neurology

## 2021-10-31 ENCOUNTER — Ambulatory Visit (INDEPENDENT_AMBULATORY_CARE_PROVIDER_SITE_OTHER): Payer: 59 | Admitting: Neurology

## 2021-10-31 VITALS — BP 129/84 | HR 122 | Ht 65.0 in | Wt 184.2 lb

## 2021-10-31 DIAGNOSIS — M79605 Pain in left leg: Secondary | ICD-10-CM | POA: Diagnosis not present

## 2021-10-31 DIAGNOSIS — M79604 Pain in right leg: Secondary | ICD-10-CM

## 2021-10-31 NOTE — Patient Instructions (Signed)
Will get a nerve conduction study of both legs. Further recommendations (such as MRI) pending results Follow up after testing.

## 2021-11-15 ENCOUNTER — Ambulatory Visit (INDEPENDENT_AMBULATORY_CARE_PROVIDER_SITE_OTHER): Payer: 59 | Admitting: Sports Medicine

## 2021-11-15 ENCOUNTER — Ambulatory Visit (INDEPENDENT_AMBULATORY_CARE_PROVIDER_SITE_OTHER): Payer: 59

## 2021-11-15 ENCOUNTER — Encounter: Payer: Self-pay | Admitting: Sports Medicine

## 2021-11-15 ENCOUNTER — Other Ambulatory Visit: Payer: Self-pay

## 2021-11-15 ENCOUNTER — Other Ambulatory Visit: Payer: Self-pay | Admitting: Sports Medicine

## 2021-11-15 ENCOUNTER — Telehealth: Payer: Self-pay | Admitting: Sports Medicine

## 2021-11-15 DIAGNOSIS — G8929 Other chronic pain: Secondary | ICD-10-CM | POA: Diagnosis not present

## 2021-11-15 DIAGNOSIS — M79671 Pain in right foot: Secondary | ICD-10-CM

## 2021-11-15 DIAGNOSIS — M25571 Pain in right ankle and joints of right foot: Secondary | ICD-10-CM

## 2021-11-15 DIAGNOSIS — S99911A Unspecified injury of right ankle, initial encounter: Secondary | ICD-10-CM | POA: Diagnosis not present

## 2021-11-15 DIAGNOSIS — S99919A Unspecified injury of unspecified ankle, initial encounter: Secondary | ICD-10-CM

## 2021-11-15 MED ORDER — TRAMADOL HCL 50 MG PO TABS: 50.0000 mg | ORAL_TABLET | Freq: Three times a day (TID) | ORAL | 0 refills | Status: AC | PRN

## 2021-11-15 NOTE — Progress Notes (Signed)
Subjective:  Ann Taylor is a 40 y.o. female patient who presents to office for evaluation of Right foot and ankle pain. Patient complains of continued pain in the ankle and foot for the last year. Patient has tried boot and was seen by previous doctor who wanted to do surgery in [facility] but she moved here in April with no relief in symptoms. Patient denies any other pedal complaints. Admits sprain injury 1 year ago.  There are no problems to display for this patient.   Current Outpatient Medications on File Prior to Visit  Medication Sig Dispense Refill   Acetaminophen (TYLENOL PO) Take by mouth. (Patient not taking: Reported on 10/31/2021)     cetirizine (ZYRTEC) 10 MG tablet Take 10 mg by mouth daily. (Patient not taking: Reported on 10/31/2021)     diphenhydrAMINE (BENADRYL) 25 mg capsule Take 25 mg by mouth every 8 (eight) hours as needed (for hives). (Patient not taking: Reported on 07/13/2021)     hydrOXYzine (ATARAX/VISTARIL) 50 MG tablet Take 1 tablet (50 mg total) by mouth 3 (three) times daily as needed. (Patient not taking: Reported on 07/13/2021) 30 tablet 0   methylPREDNISolone (MEDROL DOSEPAK) 4 MG TBPK tablet Take Tapered dose as directed (Patient not taking: Reported on 07/13/2021) 21 tablet 0   metroNIDAZOLE (FLAGYL) 500 MG tablet Take 1 tablet (500 mg total) by mouth 2 (two) times daily. (Patient not taking: Reported on 08/08/2021) 14 tablet 2   triamterene-hydrochlorothiazide (DYAZIDE) 37.5-25 MG capsule Take 1 each (1 capsule total) by mouth daily. 30 capsule 11   No current facility-administered medications on file prior to visit.    Allergies  Allergen Reactions   Shellfish Allergy Anaphylaxis   Amoxicillin Hives and Swelling    Swelling of extremities    Objective:  General: Alert and oriented x3 in no acute distress  Dermatology: No open lesions bilateral lower extremities, no webspace macerations, no ecchymosis bilateral, all nails x 10 are well  manicured.  Vascular: Dorsalis Pedis and Posterior Tibial pedal pulses palpable, Capillary Fill Time 3 seconds,(+) pedal hair growth bilateral, no edema bilateral lower extremities, Temperature gradient within normal limits.  Neurology: Michaell Cowing sensation intact via light touch bilateral.  Musculoskeletal: Mild to moderate tenderness with palpation at right lateral ankle and sinus tarsi. Negative talar tilt, Negative tib-fib stress,Mild instability. Guarding due to pain on right ankle with pain that radiates up leg. Strength within normal limits in all groups bilateral.   Gait: Antalgic gait  Xrays  Right foot/ Ankle   Impression: No acute osseous findings  Assessment and Plan: Problem List Items Addressed This Visit   None Visit Diagnoses     Right foot pain    -  Primary   Relevant Orders   DG Foot Complete Right   Ankle injury, right, initial encounter       Chronic pain of right ankle       Relevant Medications   traMADol (ULTRAM) 50 MG tablet   Other Relevant Orders   MR ANKLE RIGHT WO CONTRAST        -Complete examination performed -Xrays reviewed -Discussed treatement options -Rx MRI for right ankle concerning for ligament/tendon tear  -Rx Trilock brace -Rx Tramadol for severe pain -Work excuse provided for today -Patient to return to office after MRI or sooner if condition worsens.  Asencion Islam, DPM

## 2021-11-15 NOTE — Telephone Encounter (Signed)
Patient called and stated that she has the information from her prior orthopedic provider:  Dr.Clive [facility], [place], [place]  Phone Number.[phone]  I did let the patient know that she would probably need a medical release form signed from the prior office.

## 2021-11-29 ENCOUNTER — Ambulatory Visit (INDEPENDENT_AMBULATORY_CARE_PROVIDER_SITE_OTHER): Payer: Self-pay | Admitting: Neurology

## 2021-11-29 ENCOUNTER — Other Ambulatory Visit: Payer: Self-pay

## 2021-11-29 DIAGNOSIS — M79604 Pain in right leg: Secondary | ICD-10-CM

## 2021-11-29 DIAGNOSIS — M79605 Pain in left leg: Secondary | ICD-10-CM

## 2021-11-29 NOTE — Procedures (Signed)
[facility]  [address] [place], [address]  [place], [place] [address] Tel: [phone] Fax:  [phone] Test Date:  11/29/2021  Patient: Ann Taylor DOB: Sep 26, 1981 Physician: Nita Sickle, DO  Sex: Female Height: 5\' 5"  Ref Phys: , D.O.  ID#: Shon Millet   Technician:    Patient Complaints: This is a 41 year old female referred for evaluation of bilateral leg pain.  NCV & EMG Findings: Electrodiagnostic testing of the right lower extremity and additional studies of the left shows: Bilateral sural and superficial peroneal sensory responses are within normal limits. Bilateral peroneal and tibial motor responses are within normal limits. Bilateral tibial H reflex studies are within normal limits. There is no evidence of active or chronic motor axonal changes affecting any of the tested muscles.  Motor unit configuration and recruitment pattern is within normal limits.  Impression: This is a normal study of the lower extremities.  In particular, there is no evidence of a sensorimotor polyneuropathy or lumbosacral radiculopathy.   ___________________________ 24, DO    Nerve Conduction Studies Anti Sensory Summary Table   Stim Site NR Peak (ms) Norm Peak (ms) P-T Amp (V) Norm P-T Amp  Left Sup Peroneal Anti Sensory (Ant Lat Mall)  34C  12 cm    2.4 <4.5 8.6 >5  Right Sup Peroneal Anti Sensory (Ant Lat Mall)  34C  12 cm    2.3 <4.5 12.7 >5  Left Sural Anti Sensory (Lat Mall)  34C  Calf    2.9 <4.5 11.7 >5  Right Sural Anti Sensory (Lat Mall)  34C  Calf    3.3 <4.5 9.1 >5   Motor Summary Table   Stim Site NR Onset (ms) Norm Onset (ms) O-P Amp (mV) Norm O-P Amp Site1 Site2 Delta-0 (ms) Dist (cm) Vel (m/s) Norm Vel (m/s)  Left Peroneal Motor (Ext Dig Brev)  34C  Ankle    3.4 <5.5 11.7 >3 B Fib Ankle 7.3 38.0 52 >40  B Fib    10.7  10.8  Poplt B Fib 1.3 7.0 54 >40  Poplt    12.0  10.7         Right Peroneal Motor (Ext Dig Brev)  34C   Ankle    3.0 <5.5 9.6 >3 B Fib Ankle 7.5 39.0 52 >40  B Fib    10.5  9.0  Poplt B Fib 1.5 8.0 53 >40  Poplt    12.0  8.8         Left Tibial Motor (Abd Hall Brev)  34C  Ankle    4.1 <6.0 17.1 >8 Knee Ankle 8.0 41.0 51 >40  Knee    12.1  11.0         Right Tibial Motor (Abd Hall Brev)  34C  Ankle    3.8 <6.0 15.6 >8 Knee Ankle 7.8 44.0 56 >40  Knee    11.6  12.6          H Reflex Studies   NR H-Lat (ms) Lat Norm (ms) L-R H-Lat (ms)  Left Tibial (Gastroc)  34C     26.26 <35 1.63  Right Tibial (Gastroc)  34C     27.89 <35 1.63   EMG   Side Muscle Ins Act Fibs Psw Fasc Number Recrt Dur Dur. Amp Amp. Poly Poly. Comment  Right AntTibialis Nml Nml Nml Nml Nml Nml Nml Nml Nml Nml Nml Nml N/A  Right Gastroc Nml Nml Nml Nml Nml Nml Nml Nml Nml Nml Nml Nml N/A  Right Flex Dig Long Nml Nml Nml Nml Nml Nml Nml Nml Nml Nml Nml Nml N/A  Right RectFemoris Nml Nml Nml Nml Nml Nml Nml Nml Nml Nml Nml Nml N/A  Right GluteusMed Nml Nml Nml Nml Nml Nml Nml Nml Nml Nml Nml Nml N/A  Left AntTibialis Nml Nml Nml Nml Nml Nml Nml Nml Nml Nml Nml Nml N/A  Left Gastroc Nml Nml Nml Nml Nml Nml Nml Nml Nml Nml Nml Nml N/A  Left Flex Dig Long Nml Nml Nml Nml Nml Nml Nml Nml Nml Nml Nml Nml N/A  Left GluteusMed Nml Nml Nml Nml Nml Nml Nml Nml Nml Nml Nml Nml N/A  Left RectFemoris Nml Nml Nml Nml Nml Nml Nml Nml Nml Nml Nml Nml N/A      Waveforms:

## 2021-11-30 NOTE — Progress Notes (Signed)
LMOVM for pt to call us back in regards to her results.

## 2021-12-05 ENCOUNTER — Telehealth: Payer: Self-pay | Admitting: *Deleted

## 2021-12-05 NOTE — Telephone Encounter (Signed)
-----   Message from [place], [place] sent at 12/04/2021  7:27 AM EST ----- Regarding: MRI order Will you follow up on patient's MRI order. She needs to have this done before I see her so we can discuss next steps Thanks Dr. Chauncey Cruel

## 2021-12-05 NOTE — Progress Notes (Signed)
2 nd attempt at call patient no answer. Mychart message sent as well to call the office.

## 2021-12-05 NOTE — Telephone Encounter (Signed)
Called and spoke with [facility] at [facility], patient has been scheduled for 12/13/21@5 :30 pm. Patient has been notified of this appointment.

## 2021-12-06 ENCOUNTER — Ambulatory Visit (INDEPENDENT_AMBULATORY_CARE_PROVIDER_SITE_OTHER): Payer: Self-pay | Admitting: Sports Medicine

## 2021-12-06 ENCOUNTER — Other Ambulatory Visit: Payer: Self-pay

## 2021-12-06 DIAGNOSIS — M79671 Pain in right foot: Secondary | ICD-10-CM

## 2021-12-06 DIAGNOSIS — M25571 Pain in right ankle and joints of right foot: Secondary | ICD-10-CM

## 2021-12-06 DIAGNOSIS — S96911S Strain of unspecified muscle and tendon at ankle and foot level, right foot, sequela: Secondary | ICD-10-CM

## 2021-12-06 DIAGNOSIS — G8929 Other chronic pain: Secondary | ICD-10-CM

## 2021-12-06 DIAGNOSIS — S99911D Unspecified injury of right ankle, subsequent encounter: Secondary | ICD-10-CM

## 2021-12-06 MED ORDER — MELOXICAM 15 MG PO TABS: 15.0000 mg | ORAL_TABLET | Freq: Every day | ORAL | 0 refills | Status: DC

## 2021-12-06 NOTE — Progress Notes (Signed)
Subjective:  Ann Taylor is a 41 y.o. female patient who presents to office for follow up evaluation of Right foot and ankle pain. Patient reports that pain is still there getting worse can't even walk/stand to cook because of the pain.  Patient had records sent from [place].  No other pedal complaints noted.  There are no problems to display for this patient.   Current Outpatient Medications on File Prior to Visit  Medication Sig Dispense Refill   Acetaminophen (TYLENOL PO) Take by mouth. (Patient not taking: Reported on 10/31/2021)     cetirizine (ZYRTEC) 10 MG tablet Take 10 mg by mouth daily. (Patient not taking: Reported on 10/31/2021)     diphenhydrAMINE (BENADRYL) 25 mg capsule Take 25 mg by mouth every 8 (eight) hours as needed (for hives). (Patient not taking: Reported on 07/13/2021)     hydrOXYzine (ATARAX/VISTARIL) 50 MG tablet Take 1 tablet (50 mg total) by mouth 3 (three) times daily as needed. (Patient not taking: Reported on 07/13/2021) 30 tablet 0   methylPREDNISolone (MEDROL DOSEPAK) 4 MG TBPK tablet Take Tapered dose as directed (Patient not taking: Reported on 07/13/2021) 21 tablet 0   metroNIDAZOLE (FLAGYL) 500 MG tablet Take 1 tablet (500 mg total) by mouth 2 (two) times daily. (Patient not taking: Reported on 08/08/2021) 14 tablet 2   triamterene-hydrochlorothiazide (DYAZIDE) 37.5-25 MG capsule Take 1 each (1 capsule total) by mouth daily. 30 capsule 11   No current facility-administered medications on file prior to visit.    Allergies  Allergen Reactions   Shellfish Allergy Anaphylaxis   Amoxicillin Hives and Swelling    Swelling of extremities    Objective:  General: Alert and oriented x3 in no acute distress  Dermatology: No open lesions bilateral lower extremities, no webspace macerations, no ecchymosis bilateral, all nails x 10 are well manicured.  Vascular: Dorsalis Pedis and Posterior Tibial pedal pulses palpable, Capillary Fill Time 3 seconds,(+) pedal  hair growth bilateral, no edema bilateral lower extremities, Temperature gradient within normal limits.  Neurology: Michaell Cowing sensation intact via light touch bilateral.  Musculoskeletal: Mild to moderate tenderness with palpation at right lateral ankle and sinus tarsi. Negative talar tilt, Negative tib-fib stress,Mild instability. Guarding due to pain on right ankle with pain that radiates up leg. Strength within normal limits in all groups bilateral.   Gait: Antalgic gait  Assessment and Plan: Problem List Items Addressed This Visit   None Visit Diagnoses     Right foot pain    -  Primary   Right ankle injury, subsequent encounter       Chronic pain of right ankle       Tendon tear, ankle, right, sequela            -Complete examination performed -Previous records reviewed -Discussed treatement options -Awaiting MRI for right ankle concerning for ligament/tendon tear and advised patient that we need an updated image in order to decide what she needs surgical versus conservative -Dispensed CAM boot at no charge to see if this will help pain at right ankle  -Rx Mobic for pain and inflammation -Patient to return to office after MRI/virtual visit to discuss results or sooner if condition worsens.  Asencion Islam, DPM

## 2021-12-10 NOTE — Telephone Encounter (Signed)
She has been rescheduled 12/14/21,MRI is 12/13/21

## 2021-12-13 ENCOUNTER — Other Ambulatory Visit: Payer: Self-pay

## 2021-12-13 ENCOUNTER — Other Ambulatory Visit: Payer: Self-pay | Admitting: Sports Medicine

## 2021-12-13 ENCOUNTER — Ambulatory Visit
Admission: RE | Admit: 2021-12-13 | Discharge: 2021-12-13 | Disposition: A | Discharge status: Home / Self Care | Payer: Self-pay | Source: Ambulatory Visit | Attending: Sports Medicine | Admitting: Sports Medicine

## 2021-12-13 DIAGNOSIS — G8929 Other chronic pain: Secondary | ICD-10-CM

## 2021-12-14 ENCOUNTER — Other Ambulatory Visit: Payer: Self-pay

## 2021-12-14 ENCOUNTER — Telehealth: Payer: Self-pay | Admitting: Sports Medicine

## 2021-12-14 ENCOUNTER — Ambulatory Visit (INDEPENDENT_AMBULATORY_CARE_PROVIDER_SITE_OTHER): Payer: Self-pay | Admitting: Sports Medicine

## 2021-12-14 ENCOUNTER — Telehealth: Payer: Self-pay

## 2021-12-14 DIAGNOSIS — M549 Dorsalgia, unspecified: Secondary | ICD-10-CM

## 2021-12-14 DIAGNOSIS — Z712 Person consulting for explanation of examination or test findings: Secondary | ICD-10-CM

## 2021-12-14 NOTE — Progress Notes (Signed)
Called patient to review her MRI results.  Patient did not answer the phone and went directly to voicemail.  I left a voicemail message stating that I will attempt to call her back at a later time since she did not answer.  I left our office callback number in case patient has questions. -Dr. Marylene Land

## 2021-12-14 NOTE — Telephone Encounter (Signed)
Patient would like a call back, missed virtual visit this morning

## 2021-12-14 NOTE — Progress Notes (Signed)
Patient was called again at 5pm to discuss MRI results and still did not answer the phone. Voicemail was left on patient's phone to call office back on Monday to re-arrange a time again to discuss results. -Dr. Marylene Land

## 2021-12-14 NOTE — Telephone Encounter (Signed)
Patient called and said she does want to proceed with a referral please. She'd like a call back with the information.

## 2021-12-14 NOTE — Telephone Encounter (Signed)
Pt called no answer left a voice mail referral sent to [facility] they will call her to get appointment scheduled

## 2021-12-18 ENCOUNTER — Telehealth: Payer: Self-pay

## 2021-12-18 NOTE — Telephone Encounter (Signed)
Patient called the office wanting to know the results of her MRI.    Please advise ..  

## 2021-12-19 ENCOUNTER — Ambulatory Visit (INDEPENDENT_AMBULATORY_CARE_PROVIDER_SITE_OTHER): Payer: Self-pay | Admitting: Sports Medicine

## 2021-12-19 ENCOUNTER — Encounter: Payer: Self-pay | Admitting: Sports Medicine

## 2021-12-19 ENCOUNTER — Other Ambulatory Visit: Payer: Self-pay

## 2021-12-19 DIAGNOSIS — M79671 Pain in right foot: Secondary | ICD-10-CM

## 2021-12-19 DIAGNOSIS — M25571 Pain in right ankle and joints of right foot: Secondary | ICD-10-CM

## 2021-12-19 DIAGNOSIS — M6780 Other specified disorders of synovium and tendon, unspecified site: Secondary | ICD-10-CM

## 2021-12-19 NOTE — Progress Notes (Addendum)
Subjective: Ann Taylor is a 41 y.o. female patient who returns to office for follow up evaluation of Right foot and ankle pain. Patient reports that things are the same and wants to further discuss MRI results.  No other pedal complaints noted.  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     montelukast (SINGULAIR) 10 MG tablet montelukast 10 mg tablet     Rimegepant Sulfate (NURTEC) 75 MG TBDP Nurtec ODT 75 mg disintegrating tablet  TAKE 1 TABLET ON OR UNDER THE TONGUE EVERY OTHER DAY.     Acetaminophen (TYLENOL PO) Take by mouth. (Patient not taking: Reported on 10/31/2021)     cetirizine (ZYRTEC) 10 MG tablet Take 10 mg by mouth daily. (Patient not taking: Reported on 10/31/2021)     Cyanocobalamin 1000 MCG SUBL cyanocobalamin (vit B-12) 1,000 mcg sublingual tablet  Place 1 tablet every day by sublingual route.     diphenhydrAMINE (BENADRYL) 25 mg capsule Take 25 mg by mouth every 8 (eight) hours as needed (for hives). (Patient not taking: Reported on 07/13/2021)     gabapentin (NEURONTIN) 300 MG capsule gabapentin 300 mg capsule  TAKE 1 CAPSULE BY MOUTH EVERY DAY AT BEDTIME     hydrOXYzine (ATARAX/VISTARIL) 50 MG tablet Take 1 tablet (50 mg total) by mouth 3 (three) times daily as needed. (Patient not taking: Reported on 07/13/2021) 30 tablet 0   meloxicam (MOBIC) 15 MG tablet TAKE 1 TABLET(15 MG) BY MOUTH DAILY 30 tablet 0   methylPREDNISolone (MEDROL DOSEPAK) 4 MG TBPK tablet Take Tapered dose as directed  (Patient not taking: Reported on 07/13/2021) 21 tablet 0   metroNIDAZOLE (FLAGYL) 500 MG tablet Take 1 tablet (500 mg total) by mouth 2 (two) times daily. (Patient not taking: Reported on 08/08/2021) 14 tablet 2   traMADol (ULTRAM) 50 MG tablet tramadol 50 mg tablet     triamterene-hydrochlorothiazide (DYAZIDE) 37.5-25 MG capsule Take 1 each (1 capsule total) by mouth daily. 30 capsule 11   No current facility-administered medications on file prior to visit.    Allergies  Allergen Reactions   Shellfish Allergy Anaphylaxis   Amoxicillin Hives and Swelling    Swelling of extremities    Objective:  General: Alert and oriented x3 in no acute distress  Dermatology: No open lesions bilateral lower extremities, no webspace macerations, no ecchymosis bilateral, all nails x 10 are well manicured.  Vascular: Dorsalis Pedis and Posterior Tibial pedal pulses palpable, Capillary Fill Time 3 seconds,(+) pedal hair growth bilateral, no edema bilateral lower extremities, Temperature gradient within normal limits.  Neurology: Michaell Cowing sensation intact via light touch bilateral.  Musculoskeletal: Mild to moderate tenderness with palpation at right lateral ankle at Peroneal tendon course. Guarding due to pain on right ankle with pain that radiates up leg like before unable to stand long periods of time due to pain. Strength within normal limits in all groups bilateral.    Assessment and Plan: Problem List Items Addressed This Visit   None Visit Diagnoses     Tendonosis    -  Primary   Pain in right ankle and joints of right foot       Right foot pain           -Complete examination performed -Previous records and MRI results reviewed again with patient -Discussed treatment options for peroneal tendinosis -After oral consent and aseptic prep, injected a mixture containing 1 ml of 2% plain lidocaine, 1 ml 0.5% plain marcaine, 0.5 ml of kenalog 10 and 0.5 ml of dexamethasone phosphate into right  lateral ankle peroneal without complication. Post-injection care discussed with patient.  -Patient will have insurance effective again on Feb 1 in order to move forward with surgery debridement of peroneal tendon on right foot/ankle if her symptoms fail to improve since pain has been chronic -Continue with CAM boot as tolerated -Continue with Mobic for pain and inflammation -Patient to return to office after Feb 1.   Asencion Islam, DPM

## 2021-12-31 ENCOUNTER — Ambulatory Visit: Payer: Medicaid Other

## 2022-01-01 ENCOUNTER — Ambulatory Visit: Payer: Medicaid Other

## 2022-01-03 ENCOUNTER — Ambulatory Visit: Payer: Self-pay | Admitting: Sports Medicine

## 2022-01-17 ENCOUNTER — Telehealth: Payer: Self-pay

## 2022-01-17 NOTE — Telephone Encounter (Signed)
-----   Message from [place], [place] sent at 01/16/2022 10:21 PM EST ----- Regarding: Change appt Will you let patient know that I will be relocating and currently not taking on any more surgery patients at this time if she wishes to proceed with an evaluation for surgery because her pain is not better she can see a an other provider. Thanks Dr. Cannon Kettle

## 2022-01-17 NOTE — Telephone Encounter (Signed)
Lmom to call back to schedule with another provider.

## 2022-01-23 ENCOUNTER — Telehealth: Payer: Self-pay

## 2022-01-23 NOTE — Telephone Encounter (Signed)
Left message on voicemail to call back to r/s with another provider.  Left specific message stating that Dr Marylene Land was relocating and not taking any more surgical consults.

## 2022-01-23 NOTE — Telephone Encounter (Signed)
-----   Message from Asencion Islam, [place] sent at 01/23/2022  7:29 AM EST ----- Nelva Bush Will you try to reach out to patient again prior to her appt tomorrow to let her know that I will be relocating and can not take any more surgery patients at this time if she is still considering surgery  Thanks Dr. Marylene Land

## 2022-01-23 NOTE — Telephone Encounter (Signed)
Patient called back and rescheduled with Dr Charlsie Merles for 3/2 [phone]

## 2022-01-23 NOTE — Telephone Encounter (Signed)
-----   Message from Titorya Stover, DPM sent at 01/23/2022  7:29 AM EST ----- Ann Taylor Will you try to reach out to patient again prior to her appt tomorrow to let her know that I will be relocating and can not take any more surgery patients at this time if she is still considering surgery  Thanks Dr. Stover  

## 2022-01-24 ENCOUNTER — Ambulatory Visit: Payer: 59 | Admitting: Podiatry

## 2022-01-24 ENCOUNTER — Ambulatory Visit (INDEPENDENT_AMBULATORY_CARE_PROVIDER_SITE_OTHER): Payer: 59

## 2022-01-24 ENCOUNTER — Other Ambulatory Visit: Payer: Self-pay

## 2022-01-24 ENCOUNTER — Ambulatory Visit: Payer: Self-pay | Admitting: Sports Medicine

## 2022-01-24 ENCOUNTER — Encounter: Payer: Self-pay | Admitting: Podiatry

## 2022-01-24 DIAGNOSIS — M7751 Other enthesopathy of right foot: Secondary | ICD-10-CM | POA: Diagnosis not present

## 2022-01-24 DIAGNOSIS — S96911S Strain of unspecified muscle and tendon at ankle and foot level, right foot, sequela: Secondary | ICD-10-CM

## 2022-01-24 DIAGNOSIS — M79671 Pain in right foot: Secondary | ICD-10-CM

## 2022-01-24 MED ORDER — TRIAMCINOLONE ACETONIDE 10 MG/ML IJ SUSP
10.0000 mg | Freq: Once | INTRAMUSCULAR | Status: AC
  Administered 2022-01-24: 10 mg

## 2022-01-25 ENCOUNTER — Ambulatory Visit: Payer: 59 | Attending: Neurology

## 2022-01-25 DIAGNOSIS — M79604 Pain in right leg: Secondary | ICD-10-CM | POA: Diagnosis not present

## 2022-01-25 DIAGNOSIS — M545 Low back pain, unspecified: Secondary | ICD-10-CM | POA: Insufficient documentation

## 2022-01-25 DIAGNOSIS — M79605 Pain in left leg: Secondary | ICD-10-CM | POA: Insufficient documentation
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
## 2022-01-28 DIAGNOSIS — M5116 Intervertebral disc disorders with radiculopathy, lumbar region: Secondary | ICD-10-CM | POA: Diagnosis not present

## 2022-01-28 DIAGNOSIS — M62830 Muscle spasm of back: Secondary | ICD-10-CM | POA: Diagnosis not present

## 2022-01-28 DIAGNOSIS — M5441 Lumbago with sciatica, right side: Secondary | ICD-10-CM | POA: Diagnosis not present

## 2022-01-29 ENCOUNTER — Ambulatory Visit: Payer: 59

## 2022-02-05 ENCOUNTER — Ambulatory Visit: Payer: 59

## 2022-02-05 ENCOUNTER — Other Ambulatory Visit: Payer: Self-pay

## 2022-02-05 DIAGNOSIS — M545 Low back pain, unspecified: Secondary | ICD-10-CM | POA: Diagnosis not present

## 2022-02-05 DIAGNOSIS — M79604 Pain in right leg: Secondary | ICD-10-CM | POA: Diagnosis not present

## 2022-02-05 DIAGNOSIS — M79605 Pain in left leg: Secondary | ICD-10-CM
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
## 2022-02-11 ENCOUNTER — Encounter: Payer: Self-pay | Admitting: Sports Medicine

## 2022-02-12 ENCOUNTER — Ambulatory Visit: Payer: 59

## 2022-02-12 NOTE — Telephone Encounter (Signed)
Please advise 

## 2022-02-13 ENCOUNTER — Other Ambulatory Visit: Payer: Self-pay | Admitting: Physician Assistant

## 2022-02-13 DIAGNOSIS — M5116 Intervertebral disc disorders with radiculopathy, lumbar region: Secondary | ICD-10-CM

## 2022-02-14 ENCOUNTER — Ambulatory Visit (INDEPENDENT_AMBULATORY_CARE_PROVIDER_SITE_OTHER): Payer: 59 | Admitting: Podiatry

## 2022-02-14 ENCOUNTER — Encounter: Payer: Self-pay | Admitting: Podiatry

## 2022-02-14 ENCOUNTER — Other Ambulatory Visit: Payer: 59

## 2022-02-14 ENCOUNTER — Other Ambulatory Visit: Payer: Self-pay

## 2022-02-14 DIAGNOSIS — S93491S Sprain of other ligament of right ankle, sequela: Secondary | ICD-10-CM | POA: Diagnosis not present

## 2022-02-14 DIAGNOSIS — M5417 Radiculopathy, lumbosacral region: Secondary | ICD-10-CM

## 2022-02-14 DIAGNOSIS — M25371 Other instability, right ankle: Secondary | ICD-10-CM

## 2022-02-14 DIAGNOSIS — G5751 Tarsal tunnel syndrome, right lower limb: Secondary | ICD-10-CM | POA: Diagnosis not present

## 2022-02-14 NOTE — Progress Notes (Signed)
?Subjective:  ?Patient ID: Ann Taylor, female    DOB: [birth year],  MRN: [id number] ? ?Chief Complaint  ?Patient presents with  ? Tendonitis  ?    surgery consult second opinion  ? ? ?41 y.o. female presents with the above complaint. History confirmed with patient.  She is referred to me by Dr. Paulla Dolly for surgical evaluation.  She previously had undergone peroneal tendon injection and treatment by Dr. Cannon Kettle.  She has not had physical therapy formally on her ankle.  Dr. Paulla Dolly performed a right ankle injection and she has remained in the cam boot.  The CAM boot has not been helpful and she is unable to wear it often because of her limitation in driving this.  She is currently undergoing work-up for lumbar spine issues/pinched nerve/sciatica and is scheduled for an MRI of the lumbar spine next Thursday.  She complains of burning shooting radiating pain in the low outside of the ankle and into the foot.  Sometimes worse when she is on the foot symptoms worse when she is off of the foot. ? ?Objective:  ?Physical Exam: ?warm, good capillary refill, no trophic changes or ulcerative lesions, normal DP and PT pulses, normal sensory exam, and she is a positive Tinel's sign in the tarsal tunnel with palpation and percussion, also positive over the superficial peroneal nerve in the lower leg, no pain with resisted inversion eversion plantarflexion or dorsiflexion no pain on the peroneal tendons she has good 5 out of 5 strength, there is some tenderness over the ATFL and with an anterior drawer which causes pain but is not unstable.  Negative talar tilt.. ? ? ?Radiographs: ?Right ankle and foot radiographs show no acute osseous abnormalities ? ? ?Study Result ? ?Narrative & Impression  ?CLINICAL DATA:  Right lateral ankle pain related to a fall a year ?ago. Patient reports history of broken bones during the fall. ?Clinical concern for tendon abnormality. ?  ?EXAM: ?MRI OF THE RIGHT ANKLE WITHOUT CONTRAST ?   ?TECHNIQUE: ?Multiplanar, multisequence MR imaging of the ankle was performed. No ?intravenous contrast was administered. ?  ?COMPARISON:  X-ray foot 11/15/2021; X-ray ankle 09/08/2017 ?  ?FINDINGS: ?TENDONS ?  ?Peroneal: Mild tendinosis of the peroneus longus without a tear. ?Peroneal brevis intact. ?  ?Posteromedial: Posterior tibial tendon intact. Flexor hallucis ?longus tendon intact. Flexor digitorum longus tendon intact. ?  ?Anterior: Tibialis anterior tendon intact. Extensor hallucis longus ?tendon intact Extensor digitorum longus tendon intact. ?  ?Achilles:  Intact. ?  ?Plantar Fascia: Intact. ?  ?LIGAMENTS ?  ?Lateral: Mild thickening of the anterior talofibular ligament ?without surrounding soft tissue edema consistent with sequela prior ?injury without a tear. Calcaneofibular ligament intact. Posterior ?talofibular ligament intact. Anterior and posterior tibiofibular ?ligaments intact. ?  ?Medial: Deltoid ligament intact. Spring ligament intact. ?  ?CARTILAGE ?  ?Ankle Joint: No joint effusion. Normal ankle mortise. No chondral ?defect. ?  ?Subtalar Joints/Sinus Tarsi: Normal subtalar joints. No subtalar ?joint effusion. Normal sinus tarsi. ?  ?Bones: No acute fracture or dislocation. Old avulsion fracture of ?the distal tip of the lateral malleolus. ?  ?Soft Tissue: No fluid collection or hematoma. Muscles are normal ?without edema or atrophy. Tarsal tunnel is normal. ?  ?IMPRESSION: ?1. Mild tendinosis of the peroneus longus without a tear. ?2.  No acute osseous injury of the right ankle. ?  ?  ?Electronically Signed ?  By: Kathreen Devoid M.D. ?  On: 12/14/2021 10:26  ? ?Assessment:  ? ?1. Sprain of anterior talofibular  ligament of right ankle, sequela   ?2. Instability of right ankle joint   ? ? ? ?Plan:  ?Patient was evaluated and treated and all questions answered. ? ?I reviewed the clinical and radiographic findings in detail with the patient.  Most of her symptoms appear to be more neuritic in  nature she is a positive Tinel's in the tarsal tunnel and over the superficial peroneal nerve.  She currently is being worked up for lumbar spine pathology and has an MRI scheduled for next week.  I would not proceed with any foot and ankle surgery at this time until we know more about her back and she has been treated for this.  I do not find any evidence of peroneal tendon pain or weakness today.  I reviewed her MRI in detail with her including the report and the images independently and discussed that I do not see any tendinosis tearing or fluid around the peroneal tendons.  She does have some tenderness in the ATFL and this corresponds to attenuation on her MRI.  I discussed the option of arthroscopy with a repair and augmentation of the ATFL but would prefer starting with physical therapy before this.  I sent her referral to [facility] physical therapy which she has done for her back and is on hold for this because of the back pain.  I discussed if the back pain is worse with her ankle physical therapy then she can hold off for now.  Continue wearing the ankle brace and taking anti-inflammatories and gabapentin as directed.  I will see her back in 5 weeks for reevaluation. ? ? ?Return in about 5 weeks (around 03/21/2022) for follow up on back MRI and ankle, PT .  ? ?

## 2022-02-14 NOTE — Patient Instructions (Signed)
? ?Chronic Ankle Instability & Rehab ?Chronic Ankle Instability ?Chronic ankle instability is a condition that makes the ankle weak and more likely to give way. The condition is common among athletes, especially those with prior ankle ligament injury. Ligaments are strong tissues that connectbones to each other. ?What are the causes? ? ?This condition is caused by multiple ankle sprains that have not healedproperly, leaving the ankle ligaments loose or damaged. ?What increases the risk? ?This condition is more likely to develop in people who participate in sports in which there is a risk of spraining an ankle. These sports include: ?Cross-country trail running. ?Basketball. ?Baseball. ?Tennis. ?Football. ?Soccer. ?What are the signs or symptoms? ?Symptoms of this condition include: ?Rolling your ankle repeatedly. ?Swelling. ?Pain. ?Bruising. ?Tenderness. ?Feeling wobbly or unsteady on your foot. ?Difficulty walking on uneven surfaces or in the dark. ?How is this diagnosed? ?This condition may be diagnosed based on: ?Your symptoms. ?Your medical history. ?A physical exam. Your health care provider will check your balance, strength, and range of motion. He or she will also check your injured ankle against your healthy ankle. ?Imaging tests, such as: ?An X-ray. ?A CT scan. ?An MRI. ?An ultrasound. ?How is this treated? ?Treatment for this condition may include: ?Wearing a removable boot, brace, or splint. ?Wearing supportive shoes or shoe inserts. ?Applying ice to the ankle to reduce swelling. ?Taking anti-inflammatory pain medicine. ?Doing exercises (physical therapy). ?Not putting any body weight, or putting only limited body weight, on your ankle for several days. ?Gradually returning to full activity. ?Surgery to repair damaged ligaments. ?Usually, surgery is needed only if the condition is severe or if othertreatments do not work. ?Follow these instructions at home: ?If you have a boot, brace, or splint: ?Wear it  as told by your health care provider. Remove it only as told by your health care provider. ?Loosen it if your toes tingle, become numb, or turn cold and blue. ?Keep it clean. ?If it is not waterproof: ?Do not let it get wet. ?Cover it with a watertight covering when you take a bath or a shower. ?Ask your health care provider when it is safe to drive with a boot, brace, or splint on your foot. ?Managing pain, stiffness, and swelling ? ?If directed, put ice on the injured area. ?If you have a removable boot, brace, or splint, remove it as told by your health care provider. ?Put ice in a plastic bag. ?Place a towel between your skin and the bag. ?Leave the ice on for 20 minutes, 2-3 times a day. ?Move your toes, foot, and ankle often to reduce stiffness and swelling. ?Raise (elevate) the injured area above the level of your heart while you are sitting or lying down. ? ?Activity ?Return to your normal activities as told by your health care provider. Ask your health care provider what activities are safe for you. ?Do not put your full body weight on your ankle until your health care provider says that you can. ?Do not do any activities that make pain or swelling worse. ?Do exercises as told by your health care provider. ?General instructions ?Take over-the-counter and prescription medicines only as told by your health care provider. ?Wear supportive shoes or inserts as told by your health care provider. ?Keep all follow-up visits as told by your health care provider. This is important. ?How is this prevented? ?Wear supportive footwear that is appropriate for your athletic activity. ?Avoid athletic activities that cause pain or swelling in your ankle. ?See your health   care provider if you have an ankle sprain that causes pain and swelling for more than 2-4 weeks. ?Do ankle range-of-motion and strengthening exercises as told by your health care provider before beginning any athletic activity. ?If you start a new athletic  activity, start gradually to build up your strength and flexibility. ?Contact a health care provider if: ?Your condition is not getting better after 2-4 weeks of treatment. ?You cannot put body weight on your ankle without feeling more pain. ?Summary ?Chronic ankle instability is a condition that makes the ankle weak and more likely to give way. ?This condition is caused by multiple ankle sprains that have not healed properly, leaving the ankle ligaments loose or damaged. ?Treatment includes wearing a boot, brace, or splint, taking medicines for pain and inflammation, and using ice on the affected area. ?Follow your health care provider's instructions for caring for your ankle during recovery. ?Contact your health care provider if your ankle does not get better in 2-4 weeks, or if you cannot put weight on your ankle without feeling more pain. ?This information is not intended to replace advice given to you by your health care provider. Make sure you discuss any questions you have with your healthcare provider. ?Document Revised: 08/25/2018 Document Reviewed: 08/25/2018 ?Elsevier Patient Education ? 2022 [employer] ? ? ? ?Ask your health care provider which exercises are safe for you. Do exercises exactly as told by your health care provider and adjust them as directed. It is normal to feel mild stretching, pulling, tightness, or discomfort as you do these exercises. Stop right away if you feel sudden pain or your pain gets worse. Do not begin these exercises until told by your health care provider. ?Strengthening exercises ?These exercises build strength and endurance in your ankle. Endurance is theability to use your muscles for a long time, even after they get tired. ?Ankle eversion ?Sit on the floor with your legs straight out in front of you. ?Loop a rubber exercise band around the ball of your left / right foot. The ball of your foot is on the walking surface, right under your toes. Hold the ends of the band  in your hands, or secure the band to a stable object. ?Slowly push your foot outward, away from your other leg (eversion). ?Hold this position for 10 seconds. ?Slowly return your foot to the starting position. ?Repeat 10 times. Complete this exercise 2 times a day. ?Heel walking ? ?This exercise strengthens the muscles that push the ankle backward to point your toes toward your knee (ankle dorsiflexors). ?Walk on your heels for 10 ft. Keep your toes as high as possible. ?Repeat 10 times. Complete this exercise 2  times per day. ?Toe walking ? ?This exercise strengthens the muscles that push the ankle forward and your toes downward (ankle plantar flexors). ?Walk on your toes for 10 ft. Keep your heels as high as possible. ?Repeat 10  times. Complete this exercise 2  times per day. ?Balance exercises ?These exercises improve or maintain your balance. Balance is important inimproving ankle stability and preventing falls. ?Tandem walking ?Do this exercise in a hallway or room that is at least 10 ft (3 m) long. ?Stand with one foot directly in front of the other (tandem). You can use the walls to help you balance if needed, but try not to use them for support. ?Slowly lift your back foot and place it directly in front of your other foot. ?Continue to walk in this heel-to-toe way for 10   ft. ?Repeat 10  times. Complete this exercise 2  times a day. ?Single leg stand ?Without shoes, stand near a railing or in a doorway. You may hold on to the railing or door frame as needed for balance. ?Stand on your left / right foot. Keep your big toe down on the floor and try to keep your arch lifted. If this is too easy, you can try doing it while you do one of these actions: ?Keep your eyes closed. ?Stand on a pillow. ?Throw a ball against a wall and catch it when it returns. ?Hold this position for 10 seconds. ?Repeat 10 times. Complete this exercise 2 times a day. ?Ankle inversion and ankle eversion ?This exercise is also called  foot rotation with a balance board. It uses a balance board to rotate the foot and ankle inward (inversion) and outward (eversion). Ask your health care provider where you can get a balance board or how

## 2022-02-22 ENCOUNTER — Other Ambulatory Visit: Payer: 59

## 2022-02-26 ENCOUNTER — Ambulatory Visit: Payer: 59 | Attending: Neurology

## 2022-02-26 DIAGNOSIS — M79604 Pain in right leg: Secondary | ICD-10-CM | POA: Insufficient documentation

## 2022-02-26 DIAGNOSIS — M79605 Pain in left leg: Secondary | ICD-10-CM | POA: Insufficient documentation

## 2022-02-26 DIAGNOSIS — M545 Low back pain, unspecified: Secondary | ICD-10-CM | POA: Insufficient documentation

## 2022-02-26 NOTE — Therapy (Deleted)
?OUTPATIENT PHYSICAL THERAPY LOWER EXTREMITY EVALUATION ? ? ?Patient Name: Ann Taylor ?MRN: [id number] ?DOB:[birth year], 41 y.o., female ?Today's Date: 02/26/2022 ? ? ? ?Past Medical History:  ?Diagnosis Date  ? Arrhythmia   ? Headache(784.0)   ? Heart murmur   ? UTI (lower urinary tract infection)   ? ?Past Surgical History:  ?Procedure Laterality Date  ? CESAREAN SECTION    ? ?Patient Active Problem List  ? Diagnosis Date Noted  ? Essential hypertension 07/28/2021  ? Family history of sickle cell anemia 07/28/2021  ? 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 Neuropathy 07/28/2021  ? Other allergy, initial encounter 07/28/2021  ? 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 Migraine 07/26/2021  ? ? ?PCP: Sherin Quarry, PA-C ? ?REFERRING PROVIDER: Sherin Quarry, PA-C ? 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 ?THERAPY DIAG: Sprain of anterior talofibular ligament of right ankle ? ? ?ONSET DATE: *** ? ?SUBJECTIVE:  ? ?SUBJECTIVE STATEMENT: ?*** ? ?PERTINENT HISTORY: ?41 y.o. female presents with the above complaint. History confirmed with patient.  She is referred to me by Dr. Paulla Dolly for surgical evaluation.  She previously had undergone peroneal tendon injection and treatment by Dr. Cannon Kettle.  She has not had physical therapy formally on her ankle.  Dr. Paulla Dolly performed a right ankle injection and she has remained in the cam boot.  The CAM boot has not been helpful and she is unable to wear it often because of her limitation in driving this.  She is currently undergoing work-up for lumbar spine issues/pinched nerve/sciatica and is scheduled for an MRI of the lumbar spine next Thursday.  She complains of burning shooting radiating pain in the low outside of the ankle and into the foot.  Sometimes worse when she is on the foot symptoms worse when she is off of the foot.  ? ?PAIN:  ?Are you having pain?  {OPRCPAIN:27236} ? ?PRECAUTIONS: Fall ? ?WEIGHT BEARING RESTRICTIONS No ? ?FALLS:  ?Has patient fallen in last 6 months? {fallsyesno:27318} ? ?LIVING ENVIRONMENT: ?Lives with: lives with their family ?Lives in: House/apartment ?Stairs: {opstairs:27293} ?Has following equipment at home: {Assistive devices:23999} ? ?OCCUPATION: *** ? ?PLOF: Independent ? ?PATIENT GOALS To reduce and manage my ankle pain ? ? ?OBJECTIVE:  ? ?DIAGNOSTIC FINDINGS: Radiographs: ?Right ankle and foot radiographs show no acute osseous abnormalities ?  ?  ?Study Result ?  ?Narrative & Impression  ?CLINICAL DATA:  Right lateral ankle pain related to a fall a year ?ago. Patient reports history of broken bones during the fall. ?Clinical concern for tendon abnormality. ?  ?EXAM: ?MRI OF THE RIGHT ANKLE WITHOUT CONTRAST ?  ?TECHNIQUE: ?Multiplanar, multisequence MR imaging of the ankle was performed. No ?intravenous contrast was administered. ?  ?COMPARISON:  X-ray foot 11/15/2021; X-ray ankle 09/08/2017 ?  ?FINDINGS: ?TENDONS ?  ?Peroneal: Mild tendinosis of the peroneus longus without a tear. ?Peroneal brevis intact. ?  ?Posteromedial: Posterior tibial tendon intact. Flexor hallucis ?longus tendon intact. Flexor digitorum longus tendon intact. ?  ?Anterior: Tibialis anterior tendon intact. Extensor hallucis longus ?tendon intact Extensor digitorum longus tendon intact. ?  ?Achilles:  Intact. ?  ?Plantar Fascia: Intact. ?  ?LIGAMENTS ?  ?Lateral: Mild thickening of the anterior talofibular ligament ?without surrounding soft tissue edema consistent with sequela prior ?injury without a tear. Calcaneofibular ligament intact. Posterior ?talofibular ligament intact. Anterior and posterior tibiofibular ?ligaments intact. ?  ?Medial: Deltoid ligament intact.  Spring ligament intact. ?  ?CARTILAGE ?  ?Ankle Joint: No joint effusion. Normal ankle mortise. No chondral ?defect. ?  ?Subtalar Joints/Sinus Tarsi: Normal subtalar joints. No subtalar ?joint  effusion. Normal sinus tarsi. ?  ?Bones: No acute fracture or dislocation. Old avulsion fracture of ?the distal tip of the lateral malleolus. ?  ?Soft Tissue: No fluid collection or hematoma. Muscles are normal ?without edema or atrophy. Tarsal tunnel is normal. ?  ?IMPRESSION: ?1. Mild tendinosis of the peroneus longus without a tear. ?2.  No acute osseous injury of the right ankle. ?  ?  ?Electronically Signed ?  By: Kathreen Devoid M.D. ?  On: 12/14/2021 10:26  ? ? ?PATIENT SURVEYS:  ?{rehab surveys:24030} ? ?COGNITION: ? Overall cognitive status: Within functional limits for tasks assessed   ?  ?SENSATION: ?WFL ? ?MUSCLE LENGTH: ?Hamstrings: Right *** deg; Left *** deg ?Thomas test: Right *** deg; Left *** deg ? ?POSTURE:  ?*** ? ?PALPATION: ?*** ? ?LE ROM: ? ?{AROM/PROM:27142} ROM Right ?02/26/2022 Left ?02/26/2022  ?Hip flexion    ?Hip extension    ?Hip abduction    ?Hip adduction    ?Hip internal rotation    ?Hip external rotation    ?Knee flexion    ?Knee extension    ?Ankle dorsiflexion    ?Ankle plantarflexion    ?Ankle inversion    ?Ankle eversion    ? (Blank rows = not tested) ? ?LE MMT: ? ?MMT Right ?02/26/2022 Left ?02/26/2022  ?Hip flexion    ?Hip extension    ?Hip abduction    ?Hip adduction    ?Hip internal rotation    ?Hip external rotation    ?Knee flexion    ?Knee extension    ?Ankle dorsiflexion    ?Ankle plantarflexion    ?Ankle inversion    ?Ankle eversion    ? (Blank rows = not tested) ? ?LOWER EXTREMITY SPECIAL TESTS:  ?{LEspecialtests:26242} ? ?FUNCTIONAL TESTS:  ?{Functional tests:24029} ? ?GAIT: ?Distance walked: 11ft x2 ?Assistive device utilized: None ?Level of assistance: Complete Independence ?Comments: antalgic gait ? ? ? ?TODAY'S TREATMENT: ?*** ? ? ?PATIENT EDUCATION:  ?Education details: Discussed eval findings, rehab rationale and POC and patient is in agreement  ?Person educated: Patient ?Education method: Explanation, Demonstration, and Handouts ?Education comprehension: verbalized  understanding, returned demonstration, and needs further education ? ? ?HOME EXERCISE PROGRAM: ?*** ? ?ASSESSMENT: ? ?CLINICAL IMPRESSION: ?Patient is a 41 y.o. female who was seen today for physical therapy evaluation and treatment for R ankle pain.  ? ? ?OBJECTIVE IMPAIRMENTS {opptimpairments:25111}.  ? ?ACTIVITY LIMITATIONS {activity limitations:25113}.  ? ?PERSONAL FACTORS {Personal factors:25162} are also affecting patient's functional outcome.  ? ? ?REHAB POTENTIAL: Good ? ?CLINICAL DECISION MAKING: Stable/uncomplicated ? ?EVALUATION COMPLEXITY: Low ? ? ?GOALS: ?Goals reviewed with patient? Yes ? ?SHORT TERM GOALS: Target date: {follow up:25551} ? ?*** ?Baseline: ?Goal status: {GOALSTATUS:25110} ? ?2.  *** ?Baseline:  ?Goal status: {GOALSTATUS:25110} ? ?3.  *** ?Baseline:  ?Goal status: {GOALSTATUS:25110} ? ?4.  *** ?Baseline:  ?Goal status: {GOALSTATUS:25110} ? ?5.  *** ?Baseline:  ?Goal status: {GOALSTATUS:25110} ? ?6.  *** ?Baseline:  ?Goal status: {GOALSTATUS:25110} ? ?LONG TERM GOALS: Target date: {follow up:25551} ? ?*** ?Baseline:  ?Goal status: {GOALSTATUS:25110} ? ?2.  *** ?Baseline:  ?Goal status: {GOALSTATUS:25110} ? ?3.  *** ?Baseline:  ?Goal status: {GOALSTATUS:25110} ? ?4.  *** ?Baseline:  ?Goal status: {GOALSTATUS:25110} ? ?5.  *** ?Baseline:  ?Goal status: {GOALSTATUS:25110} ? ?6.  *** ?Baseline:  ?Goal status: {GOALSTATUS:25110} ? ? ?PLAN: ?  PT FREQUENCY: 1x/week ? ?PT DURATION: 6 weeks ? ?PLANNED INTERVENTIONS: Therapeutic exercises, Therapeutic activity, Neuromuscular re-education, Balance training, Gait training, Patient/Family education, Joint mobilization, Stair training, Aquatic Therapy, and Manual therapy ? ?PLAN FOR NEXT SESSION: *** ? ? ?Lanice Shirts, PT ?02/26/2022, 1:27 PM  ?

## 2022-03-11 ENCOUNTER — Ambulatory Visit: Payer: 59

## 2022-03-11 DIAGNOSIS — M79605 Pain in left leg: Secondary | ICD-10-CM | POA: Diagnosis not present

## 2022-03-11 DIAGNOSIS — M545 Low back pain, unspecified: Secondary | ICD-10-CM | POA: Diagnosis not present

## 2022-03-11 DIAGNOSIS — M79604 Pain in right leg: Secondary | ICD-10-CM | POA: Diagnosis not present
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
## 2022-03-13 ENCOUNTER — Encounter (HOSPITAL_COMMUNITY): Payer: Self-pay | Admitting: Emergency Medicine

## 2022-03-13 ENCOUNTER — Other Ambulatory Visit: Payer: Self-pay

## 2022-03-13 ENCOUNTER — Emergency Department (HOSPITAL_COMMUNITY)
Admission: EM | Admit: 2022-03-13 | Discharge: 2022-03-14 | Discharge status: Against Medical Advice | Payer: 59 | Attending: Emergency Medicine | Admitting: Emergency Medicine

## 2022-03-13 DIAGNOSIS — H93239 Hyperacusis, unspecified ear: Secondary | ICD-10-CM | POA: Diagnosis not present

## 2022-03-13 DIAGNOSIS — G43909 Migraine, unspecified, not intractable, without status migrainosus: Secondary | ICD-10-CM | POA: Diagnosis not present

## 2022-03-13 DIAGNOSIS — Z5321 Procedure and treatment not carried out due to patient leaving prior to being seen by health care provider: Secondary | ICD-10-CM | POA: Diagnosis not present

## 2022-03-13 DIAGNOSIS — H5371 Glare sensitivity: Secondary | ICD-10-CM | POA: Diagnosis not present

## 2022-03-13 NOTE — ED Triage Notes (Signed)
Pt c/o migraine x 3 days with sensitivity to light and sound. Denies nausea/vomiting. ?

## 2022-03-14 DIAGNOSIS — G44209 Tension-type headache, unspecified, not intractable: Secondary | ICD-10-CM | POA: Diagnosis not present

## 2022-03-14 DIAGNOSIS — Z1331 Encounter for screening for depression: Secondary | ICD-10-CM | POA: Diagnosis not present

## 2022-03-14 DIAGNOSIS — M545 Low back pain, unspecified: Secondary | ICD-10-CM | POA: Diagnosis not present

## 2022-03-14 NOTE — ED Notes (Signed)
Pt left. 

## 2022-03-15 DIAGNOSIS — M791 Myalgia, unspecified site: Secondary | ICD-10-CM | POA: Diagnosis not present

## 2022-03-15 DIAGNOSIS — Z1331 Encounter for screening for depression: Secondary | ICD-10-CM | POA: Diagnosis not present

## 2022-03-20 NOTE — Therapy (Incomplete)
?OUTPATIENT PHYSICAL THERAPY LOWER EXTREMITY EVALUATION ? ? ?Patient Name: Ann Taylor ?MRN: [id number] ?DOB:08-22-1981, 41 y.o., female ?Today's Date: 03/20/2022 ? ? ? ?Past Medical History:  ?Diagnosis Date  ? Arrhythmia   ? Headache(784.0)   ? Heart murmur   ? UTI (lower urinary tract infection)   ? ?Past Surgical History:  ?Procedure Laterality Date  ? CESAREAN SECTION    ? ?Patient Active Problem List  ? Diagnosis Date Noted  ? Essential hypertension 07/28/2021  ? Family history of sickle cell anemia 07/28/2021  ? 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 Neuropathy 07/28/2021  ? Other allergy, initial encounter 07/28/2021  ? 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 Migraine 07/26/2021  ? ? ?PCP: *** ? ?REFERRING PROVIDER: *** ? ?REFERRING DIAG:  ?Sprain of anterior talofibular ligament of right ankle, sequela; Instability of right ankle joint ? ?THERAPY DIAG:  ?No diagnosis found. ? ?ONSET DATE: *** ? ?SUBJECTIVE:  ? ?SUBJECTIVE STATEMENT: ?*** ? ?PERTINENT HISTORY: ?*** ? ?PAIN:  ?Are you having pain? {OPRCPAIN:27236} ? ?PRECAUTIONS: {Therapy precautions:24002} ? ?WEIGHT BEARING RESTRICTIONS {Yes ***/No:24003} ? ?FALLS:  ?Has patient fallen in last 6 months? {fallsyesno:27318} ? ?LIVING ENVIRONMENT: ?Lives with: {OPRC lives with:25569::"lives with their family"} ?Lives in: {Lives in:25570} ?Stairs: {opstairs:27293} ?Has following equipment at home: {Assistive devices:23999} ? ?OCCUPATION: *** ? ?PLOF: {PLOF:24004} ? ?PATIENT GOALS *** ? ? ?OBJECTIVE:  ? ?DIAGNOSTIC FINDINGS: *** ? ?PATIENT SURVEYS:  ?{rehab surveys:24030} ? ?COGNITION: ? Overall cognitive status: {cognition:24006}   ?  ?SENSATION: ?{sensation:27233} ? ?MUSCLE LENGTH: ?Hamstrings: Right *** deg; Left *** deg ?Thomas test: Right *** deg; Left *** deg ? ?POSTURE:  ?*** ? ?PALPATION: ?*** ? ?LE ROM: ? ?{AROM/PROM:27142} ROM Right ?03/20/2022 Left ?03/20/2022  ?Hip flexion    ?Hip extension    ?Hip abduction    ?Hip adduction    ?Hip internal rotation    ?Hip  external rotation    ?Knee flexion    ?Knee extension    ?Ankle dorsiflexion    ?Ankle plantarflexion    ?Ankle inversion    ?Ankle eversion    ? (Blank rows = not tested) ? ?LE MMT: ? ?MMT Right ?03/20/2022 Left ?03/20/2022  ?Hip flexion    ?Hip extension    ?Hip abduction    ?Hip adduction    ?Hip internal rotation    ?Hip external rotation    ?Knee flexion    ?Knee extension    ?Ankle dorsiflexion    ?Ankle plantarflexion    ?Ankle inversion    ?Ankle eversion    ? (Blank rows = not tested) ? ?LOWER EXTREMITY SPECIAL TESTS:  ?{LEspecialtests:26242} ? ?FUNCTIONAL TESTS:  ?{Functional tests:24029} ? ?GAIT: ?Distance walked: *** ?Assistive device utilized: {Assistive devices:23999} ?Level of assistance: {Levels of assistance:24026} ?Comments: *** ? ? ? ?TODAY'S TREATMENT: ?*** ? ? ?PATIENT EDUCATION:  ?Education details: *** ?Person educated: {Person educated:25204} ?Education method: {Education Method:25205} ?Education comprehension: {Education Comprehension:25206} ? ? ?HOME EXERCISE PROGRAM: ?*** ? ?ASSESSMENT: ? ?CLINICAL IMPRESSION: ?Patient is a *** y.o. *** who was seen today for physical therapy evaluation and treatment for ***.  ? ? ?OBJECTIVE IMPAIRMENTS {opptimpairments:25111}.  ? ?ACTIVITY LIMITATIONS {activity limitations:25113}.  ? ?PERSONAL FACTORS {Personal factors:25162} are also affecting patient's functional outcome.  ? ? ?REHAB POTENTIAL: {rehabpotential:25112} ? ?CLINICAL DECISION MAKING: {clinical decision making:25114} ? ?EVALUATION COMPLEXITY: {Evaluation complexity:25115} ? ? ?GOALS: ?Goals reviewed with patient? {yes/no:20286} ? ?SHORT TERM GOALS: Target date: {follow up:25551} ? ?*** ?Baseline: ?Goal status: {GOALSTATUS:25110} ? ?2.  *** ?Baseline:  ?Goal status: {GOALSTATUS:25110} ? ?  3.  *** ?Baseline:  ?Goal status: {GOALSTATUS:25110} ? ?4.  *** ?Baseline:  ?Goal status: {GOALSTATUS:25110} ? ?5.  *** ?Baseline:  ?Goal status: {GOALSTATUS:25110} ? ?6.  *** ?Baseline:  ?Goal status:  {GOALSTATUS:25110} ? ?LONG TERM GOALS: Target date: {follow up:25551} ? ?*** ?Baseline:  ?Goal status: {GOALSTATUS:25110} ? ?2.  *** ?Baseline:  ?Goal status: {GOALSTATUS:25110} ? ?3.  *** ?Baseline:  ?Goal status: {GOALSTATUS:25110} ? ?4.  *** ?Baseline:  ?Goal status: {GOALSTATUS:25110} ? ?5.  *** ?Baseline:  ?Goal status: {GOALSTATUS:25110} ? ?6.  *** ?Baseline:  ?Goal status: {GOALSTATUS:25110} ? ? ?PLAN: ?PT FREQUENCY: {rehab frequency:25116} ? ?PT DURATION: {rehab duration:25117} ? ?PLANNED INTERVENTIONS: {rehab planned interventions:25118::"Therapeutic exercises","Therapeutic activity","Neuromuscular re-education","Balance training","Gait training","Patient/Family education","Joint mobilization"} ? ?PLAN FOR NEXT SESSION: *** ? ? ?Joellyn Rued, PT ?03/20/2022, 9:17 PM  ?

## 2022-03-21 ENCOUNTER — Ambulatory Visit (INDEPENDENT_AMBULATORY_CARE_PROVIDER_SITE_OTHER): Payer: 59 | Admitting: Podiatry

## 2022-03-21 ENCOUNTER — Ambulatory Visit: Payer: 59

## 2022-03-21 DIAGNOSIS — G5751 Tarsal tunnel syndrome, right lower limb: Secondary | ICD-10-CM

## 2022-03-21 DIAGNOSIS — M5417 Radiculopathy, lumbosacral region: Secondary | ICD-10-CM | POA: Diagnosis not present

## 2022-03-21 NOTE — Addendum Note (Signed)
Addended by: Hildred Laser on: 03/21/2022 11:56 AM ? ? Modules accepted: Orders ? ?

## 2022-03-24 NOTE — Progress Notes (Signed)
?Subjective:  ?Patient ID: Ann Taylor, female    DOB: 27-Apr-1981,  MRN: [id number] ? ?Chief Complaint  ?Patient presents with  ? Ankle Injury  ?   5 weeks  follow up on back MRI and ankle, PT .  ? ? ?41 y.o. female presents with the above complaint. History confirmed with patient.  She is referred to me by Dr. Charlsie Merles for surgical evaluation.  She previously had undergone peroneal tendon injection and treatment by Dr. Marylene Land.  She has not had physical therapy formally on her ankle.  Dr. Charlsie Merles performed a right ankle injection and she has remained in the cam boot.  The CAM boot has not been helpful and she is unable to wear it often because of her limitation in driving this.  She is currently undergoing work-up for lumbar spine issues/pinched nerve/sciatica and is scheduled for an MRI of the lumbar spine next Thursday.  She complains of burning shooting radiating pain in the low outside of the ankle and into the foot.  Sometimes worse when she is on the foot symptoms worse when she is off of the foot. ? ? ?Interval history ?Since last visit the MRI of her back has not been completed the insurance denied it ? ?Objective:  ?Physical Exam: ?warm, good capillary refill, no trophic changes or ulcerative lesions, normal DP and PT pulses, normal sensory exam, and she is a positive Tinel's sign in the tarsal tunnel with palpation and percussion, also positive over the superficial peroneal nerve in the lower leg, no pain with resisted inversion eversion plantarflexion or dorsiflexion no pain on the peroneal tendons she has good 5 out of 5 strength, there is some tenderness over the ATFL and with an anterior drawer which causes pain but is not unstable.  Negative talar tilt.. ? ? ?Radiographs: ?Right ankle and foot radiographs show no acute osseous abnormalities ? ? ?Study Result ? ?Narrative & Impression  ?CLINICAL DATA:  Right lateral ankle pain related to a fall a year ?ago. Patient reports history of broken bones  during the fall. ?Clinical concern for tendon abnormality. ?  ?EXAM: ?MRI OF THE RIGHT ANKLE WITHOUT CONTRAST ?  ?TECHNIQUE: ?Multiplanar, multisequence MR imaging of the ankle was performed. No ?intravenous contrast was administered. ?  ?COMPARISON:  X-ray foot 11/15/2021; X-ray ankle 09/08/2017 ?  ?FINDINGS: ?TENDONS ?  ?Peroneal: Mild tendinosis of the peroneus longus without a tear. ?Peroneal brevis intact. ?  ?Posteromedial: Posterior tibial tendon intact. Flexor hallucis ?longus tendon intact. Flexor digitorum longus tendon intact. ?  ?Anterior: Tibialis anterior tendon intact. Extensor hallucis longus ?tendon intact Extensor digitorum longus tendon intact. ?  ?Achilles:  Intact. ?  ?Plantar Fascia: Intact. ?  ?LIGAMENTS ?  ?Lateral: Mild thickening of the anterior talofibular ligament ?without surrounding soft tissue edema consistent with sequela prior ?injury without a tear. Calcaneofibular ligament intact. Posterior ?talofibular ligament intact. Anterior and posterior tibiofibular ?ligaments intact. ?  ?Medial: Deltoid ligament intact. Spring ligament intact. ?  ?CARTILAGE ?  ?Ankle Joint: No joint effusion. Normal ankle mortise. No chondral ?defect. ?  ?Subtalar Joints/Sinus Tarsi: Normal subtalar joints. No subtalar ?joint effusion. Normal sinus tarsi. ?  ?Bones: No acute fracture or dislocation. Old avulsion fracture of ?the distal tip of the lateral malleolus. ?  ?Soft Tissue: No fluid collection or hematoma. Muscles are normal ?without edema or atrophy. Tarsal tunnel is normal. ?  ?IMPRESSION: ?1. Mild tendinosis of the peroneus longus without a tear. ?2.  No acute osseous injury of the right ankle. ?  ?  ?  Electronically Signed ?  By: Elige Ko M.D. ?  On: 12/14/2021 10:26  ? ?Assessment:  ? ?1. Tarsal tunnel syndrome, right   ?2. Right lumbosacral radiculopathy   ? ? ? ? ?Plan:  ?Patient was evaluated and treated and all questions answered. ? ?We again discussed today that I think it be important  to have her back further evaluated.  Her insurance initially denied the MRI because she had not had 6 weeks of treatment but they were going to reschedule at this point.  She will be seen back specialist after this.  I will see her back in 1 month after this to reevaluate ? ? ?Return in about 1 month (around 04/20/2022) for recheck ankle after PT, back MRI.  ? ?

## 2022-03-31 ENCOUNTER — Ambulatory Visit
Admission: RE | Admit: 2022-03-31 | Discharge: 2022-03-31 | Disposition: A | Discharge status: Home / Self Care | Payer: 59 | Source: Ambulatory Visit | Attending: Physician Assistant | Admitting: Physician Assistant

## 2022-03-31 DIAGNOSIS — M5116 Intervertebral disc disorders with radiculopathy, lumbar region: Secondary | ICD-10-CM

## 2022-03-31 DIAGNOSIS — M545 Low back pain, unspecified: Secondary | ICD-10-CM | POA: Diagnosis not present

## 2022-04-01 ENCOUNTER — Telehealth: Payer: Self-pay | Admitting: Physical Medicine and Rehabilitation

## 2022-04-01 DIAGNOSIS — M545 Low back pain, unspecified: Secondary | ICD-10-CM | POA: Diagnosis not present

## 2022-04-01 NOTE — Telephone Encounter (Signed)
I received an "outside messages" message from a doctor that referred this patient to Dr. Arnoldo Morale for spine [facility] neurosurgery) and Dr. Ernestina Patches for pain mgt.  I will forward you the message as well.  I think we need to have someone call and schedule OV but inform they we do not do chronic long term opiods. I fthat is needed her doctor can refer to Dr. Davy Pique in the same practice as Dr. Arnoldo Morale ?

## 2022-04-02 ENCOUNTER — Telehealth: Payer: Self-pay | Admitting: Physical Medicine and Rehabilitation

## 2022-04-02 NOTE — Telephone Encounter (Signed)
Pt called stating she received a few call from Dr [place] Blas office. Please see pt's notes from Dr Alvester Morin before call back. PA Megan please call pt back about referral. Pt phone number is [phone] ?

## 2022-04-03 ENCOUNTER — Encounter: Payer: Self-pay | Admitting: Physical Medicine and Rehabilitation

## 2022-04-03 ENCOUNTER — Ambulatory Visit (INDEPENDENT_AMBULATORY_CARE_PROVIDER_SITE_OTHER): Payer: 59 | Admitting: Physical Medicine and Rehabilitation

## 2022-04-03 VITALS — BP 119/83 | HR 89

## 2022-04-03 DIAGNOSIS — M797 Fibromyalgia: Secondary | ICD-10-CM | POA: Diagnosis not present

## 2022-04-03 DIAGNOSIS — M4726 Other spondylosis with radiculopathy, lumbar region: Secondary | ICD-10-CM

## 2022-04-03 DIAGNOSIS — M5116 Intervertebral disc disorders with radiculopathy, lumbar region: Secondary | ICD-10-CM

## 2022-04-03 DIAGNOSIS — M5416 Radiculopathy, lumbar region: Secondary | ICD-10-CM
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
## 2022-04-03 NOTE — Progress Notes (Signed)
Numeric Pain Rating Scale and Functional Assessment ?Average Pain 10 ? ? ?In the last MONTH (on 0-10 scale) has pain interfered with the following? ? ?1. General activity like being  able to carry out your everyday physical activities such as walking, climbing stairs, carrying groceries, or moving a chair?  ?Rating(10) ? ?Lower back with radiculopathy down both legs. When pain is present it is extreme. ? ?Allergic to shellfish. ?+Driver, -BT, -Dye Allergies.  ?

## 2022-04-18 ENCOUNTER — Encounter: Payer: Self-pay | Admitting: Podiatry

## 2022-04-18 ENCOUNTER — Ambulatory Visit (INDEPENDENT_AMBULATORY_CARE_PROVIDER_SITE_OTHER): Payer: 59 | Admitting: Podiatry

## 2022-04-18 DIAGNOSIS — M79604 Pain in right leg: Secondary | ICD-10-CM

## 2022-04-18 DIAGNOSIS — G8929 Other chronic pain: Secondary | ICD-10-CM | POA: Diagnosis not present

## 2022-04-18 DIAGNOSIS — M6788 Other specified disorders of synovium and tendon, other site: Secondary | ICD-10-CM

## 2022-04-18 NOTE — Progress Notes (Signed)
Subjective:  Patient ID: Ann Taylor, female    DOB: 01/10/1981,  MRN: [id number]  Chief Complaint  Patient presents with   Tendonitis    Right ankle, follow up after PT and MRI of back    41 y.o. female presents with the above complaint. History confirmed with patient.  She is referred to me by Dr. Charlsie Merles for surgical evaluation.  She previously had undergone peroneal tendon injection and treatment by Dr. Marylene Land.  She has not had physical therapy formally on her ankle.  Dr. Charlsie Merles performed a right ankle injection and she has remained in the cam boot.  The CAM boot has not been helpful and she is unable to wear it often because of her limitation in driving this.  She is currently undergoing work-up for lumbar spine issues/pinched nerve/sciatica and is scheduled for an MRI of the lumbar spine next Thursday.  She complains of burning shooting radiating pain in the low outside of the ankle and into the foot.  Sometimes worse when she is on the foot symptoms worse when she is off of the foot.   Interval history She returns today for follow-up after completing the back MRI which seem to be normal.  She is seeing a new back specialist soon.  The other doctors thinks that she may have fibromyalgia  Objective:  Physical Exam: warm, good capillary refill, no trophic changes or ulcerative lesions, normal DP and PT pulses, normal sensory exam, and today there is no Tinel's sign in the tarsal tunnel.  She has a negative anterior drawer test but performing the maneuver elicits sharp shooting pain in the back of the heel which she previously has not had.  There is still persistent pain along the peroneal tendons which is worse with resisted eversion  Radiographs: Right ankle and foot radiographs show no acute osseous abnormalities   Study Result  Narrative & Impression  CLINICAL DATA:  Right lateral ankle pain related to a fall a year ago. Patient reports history of broken bones during the  fall. Clinical concern for tendon abnormality.   EXAM: MRI OF THE RIGHT ANKLE WITHOUT CONTRAST   TECHNIQUE: Multiplanar, multisequence MR imaging of the ankle was performed. No intravenous contrast was administered.   COMPARISON:  X-ray foot 11/15/2021; X-ray ankle 09/08/2017   FINDINGS: TENDONS   Peroneal: Mild tendinosis of the peroneus longus without a tear. Peroneal brevis intact.   Posteromedial: Posterior tibial tendon intact. Flexor hallucis longus tendon intact. Flexor digitorum longus tendon intact.   Anterior: Tibialis anterior tendon intact. Extensor hallucis longus tendon intact Extensor digitorum longus tendon intact.   Achilles:  Intact.   Plantar Fascia: Intact.   LIGAMENTS   Lateral: Mild thickening of the anterior talofibular ligament without surrounding soft tissue edema consistent with sequela prior injury without a tear. Calcaneofibular ligament intact. Posterior talofibular ligament intact. Anterior and posterior tibiofibular ligaments intact.   Medial: Deltoid ligament intact. Spring ligament intact.   CARTILAGE   Ankle Joint: No joint effusion. Normal ankle mortise. No chondral defect.   Subtalar Joints/Sinus Tarsi: Normal subtalar joints. No subtalar joint effusion. Normal sinus tarsi.   Bones: No acute fracture or dislocation. Old avulsion fracture of the distal tip of the lateral malleolus.   Soft Tissue: No fluid collection or hematoma. Muscles are normal without edema or atrophy. Tarsal tunnel is normal.   IMPRESSION: 1. Mild tendinosis of the peroneus longus without a tear. 2.  No acute osseous injury of the right ankle.  Electronically Signed   By: Elige Ko M.D.   On: 12/14/2021 10:26   Assessment:   1. Peroneal tendinosis, right   2. Chronic pain of right lower extremity       Plan:  Patient was evaluated and treated and all questions answered.  Today we again discussed that she does have continued pain  along the peroneal tendons which is consistent with her MRI findings of tendinopathy and tendinosis without tearing.  She did not have the same Tinel's sign in the tarsal tunnel that she had previously.  She does have some pain in the heel with an anterior drawer maneuver but not with direct palpation alone.  I do think there is a possibility she may have fibromyalgia or potentially CRPS that has not been diagnosed.  She was was to be referred to a specialist for this but her previous doctor left the practice and has not done this.  I did send her a referral to the [facility] to evaluate for this.  We discussed further treatment for the tendinosis.  At this point I do not think open surgical intervention or repair would offer much benefit over the possible risks of further chronic pain.  I did discuss with her the option of a minimally invasive procedure such as a Topaz and PRP injection of the peroneal tendons.  We discussed the rationale behind this and the risk and benefits.  She understands and wishes to proceed.  Informed consent was signed and reviewed.  I will see her at surgery.    Surgical plan:  Procedure: -Right peroneal PRP and Topaz  Location: -[facility]  Anesthesia plan: -IV sedation with regional block  Postoperative pain plan: - Tylenol 1000 mg every 6 hours, ibuprofen 600 mg every 6 hours, gabapentin 300 mg every 8 hours x5 days, tramadol 50 mg every 6 hours  DVT prophylaxis: -None required  WB Restrictions / DME needs: -WBAT in CAM boot which she already has   No follow-ups on file.

## 2022-04-26 ENCOUNTER — Encounter: Payer: Self-pay | Admitting: Physical Medicine & Rehabilitation
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
## 2022-05-31 ENCOUNTER — Encounter: Payer: Self-pay | Admitting: Neurology

## 2022-05-31 ENCOUNTER — Ambulatory Visit: Payer: Medicaid Other | Admitting: Neurology

## 2022-05-31 DIAGNOSIS — Z029 Encounter for administrative examinations, unspecified: Secondary | ICD-10-CM

## 2022-06-20 ENCOUNTER — Encounter: Payer: Self-pay | Admitting: Physical Medicine & Rehabilitation

## 2022-06-20 ENCOUNTER — Other Ambulatory Visit: Payer: Self-pay | Admitting: Obstetrics

## 2022-07-11 ENCOUNTER — Encounter: Payer: 59 | Admitting: Podiatry

## 2022-07-19 ENCOUNTER — Ambulatory Visit
Admission: RE | Admit: 2022-07-19 | Discharge: 2022-07-19 | Disposition: A | Discharge status: Home / Self Care | Payer: Commercial Managed Care - HMO | Source: Ambulatory Visit | Attending: Emergency Medicine | Admitting: Emergency Medicine

## 2022-07-19 VITALS — BP 104/79 | HR 80 | Temp 98.2°F | Resp 18

## 2022-07-19 DIAGNOSIS — U071 COVID-19: Secondary | ICD-10-CM | POA: Diagnosis not present

## 2022-07-19 MED ORDER — PROMETHAZINE-DM 6.25-15 MG/5ML PO SYRP: 5.0000 mL | ORAL_SOLUTION | Freq: Four times a day (QID) | ORAL | 0 refills | Status: DC | PRN

## 2022-07-19 NOTE — ED Provider Notes (Signed)
[facility]    CSN: [id number] Arrival date & time: 07/19/22  1804    HISTORY   Chief Complaint  Patient presents with   +COVID   Nasal Congestion   Cough   Shortness of Breath   HPI Elgie Congoatalie D Henthorn is a pleasant, 41 y.o. female who presents to urgent care today. Patient complains of a positive home COVID test today.  Patient states she has been having sinus pressure, congestion, dry cough worse at night, rhinorrhea and shortness of breath for the past 2 days.  Patient denies fever, nausea, vomiting, diarrhea, body aches.  States has been taking Tylenol and TheraFlu without meaningful relief of her symptoms.  Denies known sick contacts, states no one else in her household has been sick.  The history is provided by the patient.   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 Authorizing Provider  Acetaminophen (TYLENOL PO) Take by mouth. Patient not taking: Reported on 10/31/2021    [provider]  cetirizine (ZYRTEC) 10 MG tablet Take 10 mg by mouth daily. Patient not taking: Reported on 10/31/2021    [provider]  Cyanocobalamin 1000 MCG SUBL cyanocobalamin (vit B-12) 1,000 mcg sublingual tablet  Place 1 tablet every  day by sublingual route.    [provider]  cyclobenzaprine (FLEXERIL) 10 MG tablet Take 10 mg by mouth at bedtime. 03/15/22   [provider]  dexamethasone (DECADRON) 100 MG/10ML SOLN injection Inject 10mg  IM Now 03/14/22   [provider]  diphenhydrAMINE (BENADRYL) 25 mg capsule Take 25 mg by mouth every 8 (eight) hours as needed (for hives).    [provider]  EPINEPHrine 0.3 mg/0.3 mL IJ SOAJ injection epinephrine 0.3 mg/0.3 mL injection, auto-injector  Take 1 auto as needed by injection route. 07/26/21   [provider]  gabapentin (NEURONTIN) 300 MG capsule gabapentin 300 mg capsule  TAKE 1 CAPSULE BY MOUTH EVERY DAY AT BEDTIME    [provider]  hydrOXYzine (ATARAX/VISTARIL) 50 MG tablet Take 1 tablet (50 mg total) by mouth 3 (three) times daily as needed. 03/28/18   Joni ReiningSmith, Ronald K, PA-C  ibuprofen (ADVIL) 800 MG tablet Take 1 tablet every day by oral route for 60 days. 04/01/22   [provider]  ketorolac (TORADOL) 30 MG/ML injection ketorolac 30 mg/mL (1 mL) injection solution  Inject 1 mL by intramuscular route. 03/15/22   [provider]  lidocaine (HM LIDOCAINE PATCH) 4 %  Apply to affected area. Remove after 12 hrs. 01/28/22   [provider]  meloxicam (MOBIC) 15 MG tablet TAKE 1 TABLET(15 MG) BY MOUTH DAILY 12/14/21   Asencion Islam, DPM  methylPREDNISolone (MEDROL DOSEPAK) 4 MG TBPK tablet Take Tapered dose as directed 03/28/18   Joni Reining, PA-C  metroNIDAZOLE (FLAGYL) 500 MG tablet Take 1 tablet (500 mg total) by mouth 2 (two) times daily. 07/18/21   Brock Bad, MD  montelukast (SINGULAIR) 10 MG tablet montelukast 10 mg tablet 07/26/21   [provider]  predniSONE (DELTASONE) 20 MG tablet Take 20 mg by mouth 2 (two) times daily. 01/28/22   [provider]  pregabalin (LYRICA) 75 MG capsule Take 75 mg by mouth 2 (two) times daily. 04/16/22   [provider]  promethazine  (PHENERGAN) 25 MG/ML injection Inject 25mg  IM now 03/14/22   [provider]  Rimegepant Sulfate (NURTEC) 75 MG TBDP Nurtec ODT 75 mg disintegrating tablet  TAKE 1 TABLET ON OR UNDER THE TONGUE EVERY OTHER DAY. 07/26/21   [provider]  traMADol (ULTRAM) 50 MG tablet tramadol 50 mg tablet    [provider]  triamterene-hydrochlorothiazide (DYAZIDE) 37.5-25 MG capsule TAKE 1 CAPSULE BY MOUTH EVERY DAY 06/21/22   06/23/22, MD    Family History Family History  Problem Relation Age of Onset   Alcohol abuse Neg Hx    Arthritis Neg Hx    Asthma Neg Hx    Birth defects Neg Hx    Cancer Neg Hx    COPD Neg Hx    Depression Neg Hx    Diabetes Neg Hx    Drug abuse Neg Hx    Early death Neg Hx    Hearing loss Neg Hx    Heart disease Neg Hx    Hyperlipidemia Neg Hx    Hypertension Neg Hx    Kidney disease Neg Hx    Learning disabilities Neg Hx    Mental illness Neg Hx    Mental retardation Neg Hx    Miscarriages / Stillbirths Neg Hx    Stroke Neg Hx    Vision loss Neg Hx    Social History Social History   Tobacco Use   Smoking status: Never   Smokeless tobacco: Never  Vaping Use   Vaping Use: Never used  Substance Use Topics   Alcohol use: No   Drug use: No   Allergies   Shellfish allergy, Shellfish-derived products, and Amoxicillin  Review of Systems Review of Systems Pertinent findings revealed after performing a 14 point review of systems has been noted in the history of present illness.  Physical Exam Triage Vital Signs ED Triage Vitals  Enc Vitals Group     BP 09/21/21 0827 (!) 147/82     Pulse Rate 09/21/21 0827 72     Resp 09/21/21 0827 18     Temp 09/21/21 0827 98.3 F (36.8 C)     Temp Source 09/21/21 0827 Oral     SpO2 09/21/21 0827 98 %     Weight --      Height --      Head Circumference --      Peak Flow --      Pain Score 09/21/21 0826 5     Pain Loc --      Pain Edu? --      Excl. in GC? --   No data  found.  Updated Vital Signs BP 104/79 (BP Location: Left Arm)  Pulse 80   Temp 98.2 F (36.8 C) (Oral)   Resp 18   SpO2 98%   Physical Exam Vitals and nursing note reviewed.  Constitutional:      General: She is not in acute distress.    Appearance: Normal appearance. She is not ill-appearing.  HENT:     Head: Normocephalic and atraumatic.     Salivary Glands: Right salivary gland is not diffusely enlarged or tender. Left salivary gland is not diffusely enlarged or tender.     Right Ear: Tympanic membrane, ear canal and external ear normal. No drainage. No middle ear effusion. There is no impacted cerumen. Tympanic membrane is not erythematous or bulging.     Left Ear: Tympanic membrane, ear canal and external ear normal. No drainage.  No middle ear effusion. There is no impacted cerumen. Tympanic membrane is not erythematous or bulging.     Nose: Nose normal. No nasal deformity, septal deviation, mucosal edema, congestion or rhinorrhea.     Right Turbinates: Not enlarged, swollen or pale.     Left Turbinates: Not enlarged, swollen or pale.     Right Sinus: No maxillary sinus tenderness or frontal sinus tenderness.     Left Sinus: No maxillary sinus tenderness or frontal sinus tenderness.     Mouth/Throat:     Lips: Pink. No lesions.     Mouth: Mucous membranes are moist. No oral lesions.     Pharynx: Oropharynx is clear. Uvula midline. No posterior oropharyngeal erythema or uvula swelling.     Tonsils: No tonsillar exudate. 0 on the right. 0 on the left.  Eyes:     General: Lids are normal.        Right eye: No discharge.        Left eye: No discharge.     Extraocular Movements: Extraocular movements intact.     Conjunctiva/sclera: Conjunctivae normal.     Right eye: Right conjunctiva is not injected.     Left eye: Left conjunctiva is not injected.  Neck:     Trachea: Trachea and phonation normal.  Cardiovascular:     Rate and Rhythm: Normal rate and regular rhythm.      Pulses: Normal pulses.     Heart sounds: Normal heart sounds. No murmur heard.    No friction rub. No gallop.  Pulmonary:     Effort: Pulmonary effort is normal. No accessory muscle usage, prolonged expiration or respiratory distress.     Breath sounds: Normal breath sounds. No stridor, decreased air movement or transmitted upper airway sounds. No decreased breath sounds, wheezing, rhonchi or rales.  Chest:     Chest wall: No tenderness.  Musculoskeletal:        General: Normal range of motion.     Cervical back: Normal range of motion and neck supple. Normal range of motion.  Lymphadenopathy:     Cervical: No cervical adenopathy.  Skin:    General: Skin is warm and dry.     Findings: No erythema or rash.  Neurological:     General: No focal deficit present.     Mental Status: She is alert and oriented to person, place, and time.  Psychiatric:        Mood and Affect: Mood normal.        Behavior: Behavior normal.     Visual Acuity Right Eye Distance:   Left Eye Distance:   Bilateral Distance:    Right Eye Near:   Left Eye Near:    Bilateral  Near:     UC Couse / Diagnostics / Procedures:     Radiology No results found.  Procedures Procedures (including critical care time) EKG  Pending results:  Labs Reviewed - No data to display  Medications Ordered in UC: Medications - No data to display  UC Diagnoses / Final Clinical Impressions(s)   I have reviewed the triage vital signs and the nursing notes.  Pertinent labs & imaging results that were available during my care of the patient were reviewed by me and considered in my medical decision making (see chart for details).    Final diagnoses:  COVID-19   Patient provided with a note for work.  Promethazine DM provided for cough.  Return precautions advised.  ED Prescriptions     Medication Sig Dispense Auth. Provider   promethazine-dextromethorphan (PROMETHAZINE-DM) 6.25-15 MG/5ML syrup Take 5 mLs by mouth 4  (four) times daily as needed for cough. 118 mL Theadora Rama Scales, PA-C      PDMP not reviewed this encounter.  Disposition Upon Discharge:  Condition: stable for discharge home Home: take medications as prescribed; routine discharge instructions as discussed; follow up as advised.  Patient presented with an acute illness with associated systemic symptoms and significant discomfort requiring urgent management. In my opinion, this is a condition that a prudent lay person (someone who possesses an average knowledge of health and medicine) may potentially expect to result in complications if not addressed urgently such as respiratory distress, impairment of bodily function or dysfunction of bodily organs.   Routine symptom specific, illness specific and/or disease specific instructions were discussed with the patient and/or caregiver at length.   As such, the patient has been evaluated and assessed, work-up was performed and treatment was provided in alignment with urgent care protocols and evidence based medicine.  Patient/parent/caregiver has been advised that the patient may require follow up for further testing and treatment if the symptoms continue in spite of treatment, as clinically indicated and appropriate.  If the patient was tested for COVID-19, Influenza and/or RSV, then the patient/parent/guardian was advised to isolate at home pending the results of his/her diagnostic coronavirus test and potentially longer if they're positive. I have also advised pt that if his/her COVID-19 test returns positive, it's recommended to self-isolate for at least 10 days after symptoms first appeared AND until fever-free for 24 hours without fever reducer AND other symptoms have improved or resolved. Discussed self-isolation recommendations as well as instructions for household member/close contacts as per the [facility] and [place] DHHS, and also gave patient the COVID packet with this  information.  Patient/parent/caregiver has been advised to return to the [facility] or PCP in 3-5 days if no better; to PCP or the Emergency Department if new signs and symptoms develop, or if the current signs or symptoms continue to change or worsen for further workup, evaluation and treatment as clinically indicated and appropriate  The patient will follow up with their current PCP if and as advised. If the patient does not currently have a PCP we will assist them in obtaining one.   The patient may need specialty follow up if the symptoms continue, in spite of conservative treatment and management, for further workup, evaluation, consultation and treatment as clinically indicated and appropriate.  Patient/parent/caregiver verbalized understanding and agreement of plan as discussed.  All questions were addressed during visit.  Please see discharge instructions below for further details of plan.  Discharge Instructions:   Discharge Instructions      I have  enclosed some information about COVID-19 quarantine and isolation that I hope you find helpful.  I have also provided you with a prescription for Promethazine DM that you can take 3 times daily as needed for cough, congestion and to get some sleep.  You are also welcome to take ibuprofen and Tylenol as needed for aches and pains.  I have provided you with a return to work note for this coming Monday.  If you are not feeling well enough to return to work on Monday, please come back for a follow-up visit and we will be happy to write you a note for 3 more days.  Thank you for visiting urgent care today.  We hope you feel better soon.      This office note has been dictated using Teaching laboratory technician.  Unfortunately, this method of dictation can sometimes lead to typographical or grammatical errors.  I apologize for your inconvenience in advance if this occurs.  Please do not hesitate to reach out to me if clarification is needed.       Theadora Rama Scales, [place] 07/19/22 [phone]

## 2022-07-19 NOTE — ED Triage Notes (Signed)
The patient states she had a +covid test at home today.  Symptoms: congestion, cough, runny nose, SOB  Started: Wednesday  Home Interventions: tylenol, theraflu

## 2022-07-19 NOTE — Discharge Instructions (Addendum)
I have enclosed some information about COVID-19 quarantine and isolation that I hope you find helpful.  I have also provided you with a prescription for Promethazine DM that you can take 3 times daily as needed for cough, congestion and to get some sleep.  You are also welcome to take ibuprofen and Tylenol as needed for aches and pains.  I have provided you with a return to work note for this coming Monday.  If you are not feeling well enough to return to work on Monday, please come back for a follow-up visit and we will be happy to write you a note for 3 more days.  Thank you for visiting urgent care today.  We hope you feel better soon.

## 2022-07-26 ENCOUNTER — Encounter: Payer: 59 | Admitting: Podiatry

## 2022-08-07 ENCOUNTER — Telehealth: Payer: Self-pay | Admitting: Urology

## 2022-08-07 NOTE — Telephone Encounter (Signed)
DOS - 08/30/22  TENOLYSIS RIGHT --- 15726  [employer] EFFECTIVE DATE - 06/25/22  PER [employer] AUTOMATIVE SYSTEM FOR CPT CODE 20355 NO PRIOR AUTH IS REQUIRED.   REF # [id number]

## 2022-08-15 ENCOUNTER — Encounter: Payer: 59 | Admitting: Podiatry

## 2022-08-27 ENCOUNTER — Encounter
Payer: Commercial Managed Care - HMO | Attending: Physical Medicine & Rehabilitation | Admitting: Physical Medicine & Rehabilitation

## 2022-08-28 ENCOUNTER — Telehealth: Payer: Self-pay

## 2022-08-28 NOTE — Telephone Encounter (Signed)
Ann Taylor called to reschedule her surgery with Dr. Sherryle Lis on 08/30/2022. She stated she can't afford to take time of work at this time. She rescheduled to 10/11/2022. Notified Dr. Sherryle Lis and Caren Griffins with [facility].

## 2022-09-05 ENCOUNTER — Encounter: Payer: Commercial Managed Care - HMO | Admitting: Podiatry

## 2022-09-19 ENCOUNTER — Encounter: Payer: 59 | Admitting: Podiatry

## 2022-09-23 ENCOUNTER — Telehealth: Payer: Self-pay | Admitting: General Practice

## 2022-09-23 NOTE — Telephone Encounter (Signed)
Called pt in regards to a refill request. Py did not answer. Hippa compliant vm left to call the office back.

## 2022-09-24 ENCOUNTER — Other Ambulatory Visit: Payer: Self-pay | Admitting: General Practice

## 2022-09-24 ENCOUNTER — Telehealth: Payer: Self-pay | Admitting: General Practice

## 2022-09-24 MED ORDER — TRIAMTERENE-HCTZ 37.5-25 MG PO CAPS: 1.0000 | ORAL_CAPSULE | Freq: Every day | ORAL | 0 refills | Status: DC

## 2022-09-24 NOTE — Telephone Encounter (Signed)
S/w pt about BP medication and informed her she needs to see her PCP to manage BP. Pt states she has an appt with PCP on 11/9. Refill sent to ensure pt has medication until that appt. Pt verbalized understanding and will discuss BP management and medication with her PCP.

## 2022-10-10 ENCOUNTER — Encounter: Payer: 59 | Admitting: Podiatry

## 2022-10-11 ENCOUNTER — Other Ambulatory Visit: Payer: Self-pay | Admitting: Podiatry

## 2022-10-11 DIAGNOSIS — M7661 Achilles tendinitis, right leg: Secondary | ICD-10-CM | POA: Diagnosis not present

## 2022-10-11 MED ORDER — OXYCODONE-ACETAMINOPHEN 5-325 MG PO TABS: 1.0000 | ORAL_TABLET | ORAL | 0 refills | Status: DC | PRN

## 2022-10-11 NOTE — Progress Notes (Signed)
11/17 R peroneal PRP injection and Topaz procedure

## 2022-10-13 ENCOUNTER — Other Ambulatory Visit: Payer: Self-pay | Admitting: Obstetrics and Gynecology

## 2022-10-14 ENCOUNTER — Telehealth: Payer: Self-pay

## 2022-10-14 MED ORDER — HYDROMORPHONE HCL 2 MG PO TABS: 2.0000 mg | ORAL_TABLET | Freq: Four times a day (QID) | ORAL | 0 refills | Status: AC | PRN

## 2022-10-15 NOTE — Telephone Encounter (Signed)
Left a detailed message for the patient about new medication that was sent.

## 2022-10-22 ENCOUNTER — Ambulatory Visit (INDEPENDENT_AMBULATORY_CARE_PROVIDER_SITE_OTHER): Payer: Medicaid Other | Admitting: Podiatry

## 2022-10-22 ENCOUNTER — Encounter: Payer: Self-pay | Admitting: Podiatry

## 2022-10-22 DIAGNOSIS — M6788 Other specified disorders of synovium and tendon, other site: Secondary | ICD-10-CM | POA: Diagnosis not present

## 2022-10-22 MED ORDER — IBUPROFEN 800 MG PO TABS
800.0000 mg | ORAL_TABLET | Freq: Three times a day (TID) | ORAL | 3 refills | Status: DC | PRN
Start: 2022-10-22 — End: 2023-01-05

## 2022-10-23 NOTE — Progress Notes (Signed)
  Subjective:  Patient ID: Ann Taylor, female    DOB: 15-Jul-1981,  MRN: [id number]  Chief Complaint  Patient presents with   Routine Post Op    POV #1 DOS 10/11/2022 TOPAZ PROCEDURE & PRP INJECTION RT ANKLE    41 y.o. female returns for post-op check.  Overall she is doing okay she does have some soreness  Review of Systems: Negative except as noted in the HPI. Denies N/V/F/Ch.   Objective:  There were no vitals filed for this visit. There is no height or weight on file to calculate BMI. Constitutional Well developed. Well nourished.  Vascular Foot warm and well perfused. Capillary refill normal to all digits.  Calf is soft and supple, no posterior calf or knee pain, negative Homans' sign  Neurologic Normal speech. Oriented to person, place, and time. Epicritic sensation to light touch grossly present bilaterally.  Dermatologic Skin healing well without signs of infection. Skin edges well coapted without signs of infection.  Orthopedic: Tenderness to palpation noted about the surgical site.    Assessment:   1. Peroneal tendinosis, right    Plan:  Patient was evaluated and treated and all questions answered.  S/p foot surgery right -Progressing as expected post-operatively. -She may transition from the cam walker boot to WBAT in the Tri-Lock ankle boot, the walker boot is giving her trouble with her back which is still an issue contributing to her lower extremity pain they have recommended an injection and she is working on getting this scheduled -May return to work after the next visit -Rx Motrin 800 mg sent to pharmacy, she is not able to tolerate other medications  Return in about 3 weeks (around 11/12/2022).

## 2022-10-27 ENCOUNTER — Encounter (HOSPITAL_COMMUNITY): Payer: Self-pay | Admitting: *Deleted

## 2022-10-27 ENCOUNTER — Other Ambulatory Visit: Payer: Self-pay

## 2022-10-27 ENCOUNTER — Ambulatory Visit (HOSPITAL_COMMUNITY)
Admission: EM | Admit: 2022-10-27 | Discharge: 2022-10-27 | Disposition: A | Discharge status: Home / Self Care | Payer: Commercial Managed Care - HMO | Attending: Family Medicine | Admitting: Family Medicine

## 2022-10-27 DIAGNOSIS — J069 Acute upper respiratory infection, unspecified: Secondary | ICD-10-CM | POA: Diagnosis not present

## 2022-10-27 DIAGNOSIS — J029 Acute pharyngitis, unspecified: Secondary | ICD-10-CM | POA: Insufficient documentation

## 2022-10-27 DIAGNOSIS — Z1152 Encounter for screening for COVID-19: Secondary | ICD-10-CM | POA: Diagnosis not present

## 2022-10-27 LAB — POCT RAPID STREP A, ED / UC: Streptococcus, Group A Screen (Direct): NEGATIVE

## 2022-10-27 MED ORDER — BENZONATATE 100 MG PO CAPS: 100.0000 mg | ORAL_CAPSULE | Freq: Three times a day (TID) | ORAL | 0 refills | Status: DC | PRN

## 2022-10-27 NOTE — ED Triage Notes (Signed)
Pt reports having a sore throat for 3 days. Pt reports her son tested positive for strep 4 days ago.

## 2022-10-27 NOTE — ED Provider Notes (Signed)
[place]    CSN: [id number] Arrival date & time: 10/27/22  1627      History   Chief Complaint Chief Complaint  Patient presents with   Sore Throat   Fever   Cough   Muscle Pain    HPI Ann Taylor is a 41 y.o. female.    Sore Throat  Fever Associated symptoms: cough   Cough Associated symptoms: fever   Muscle Pain   For sore throat, cough, and rhinorrhea.  Symptoms began about November 30.  She has had some subjective fever off and on.  No vomiting or diarrhea.  Her son tested positive for strep about 4 days ago.  She states his COVID and flu test were negative.  He also mentions shortness of breath for the last month, but she relates that she thinks this may be due to her anxiety.  She does have primary care  Also when I asked her about allergies, she states that she is not allergic to amoxicillin.  She is taking it recently and found that she did not have any reaction.  She states it is a different medication she took when pregnant 19 years ago.  She is uncertain when it was, and states it was not amoxicillin or penicillin.  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 Authorizing Provider  benzonatate (TESSALON) 100 MG capsule Take 1 capsule (100 mg total) by mouth 3 (three) times daily as needed for cough. 10/27/22  Yes Barrett Henle, MD  ibuprofen (ADVIL) 800 MG tablet Take 1 tablet (800 mg total) by mouth every 8 (eight) hours as needed. 10/22/22   McDonald, Stephan Minister, DPM  pregabalin (LYRICA) 75 MG capsule Take 75 mg by mouth 2 (two) times daily. 04/16/22   [provider]  triamterene-hydrochlorothiazide (DYAZIDE) 37.5-25 MG capsule Take 1 each (1 capsule total) by mouth daily. 09/24/22   Constant, Peggy, MD    Family History Family History  Problem Relation Age of Onset   Alcohol abuse Neg Hx    Arthritis Neg Hx    Asthma Neg Hx    Birth defects Neg Hx    Cancer Neg Hx    COPD Neg Hx    Depression Neg Hx    Diabetes Neg Hx    Drug abuse Neg Hx    Early death Neg Hx  Hearing loss Neg Hx    Heart disease Neg Hx    Hyperlipidemia Neg Hx    Hypertension Neg Hx    Kidney disease Neg Hx    Learning disabilities Neg Hx    Mental illness Neg Hx    Mental retardation Neg Hx    Miscarriages / Stillbirths Neg Hx    Stroke Neg Hx    Vision loss Neg Hx     Social History Social History   Tobacco Use   Smoking status: Never   Smokeless tobacco: Never  Vaping Use   Vaping Use: Never used  Substance Use Topics   Alcohol use: No   Drug use: No     Allergies   Oxycodone, Shellfish allergy, Shellfish-derived products, Codone [hydrocodone], Codeine, and Tramadol   Review of Systems Review of Systems  Constitutional:  Positive for fever.  Respiratory:  Positive for cough.      Physical Exam Triage Vital Signs ED Triage Vitals  Enc Vitals Group     BP 10/27/22 1757 123/75     Pulse Rate 10/27/22 1757 (!) 102     Resp 10/27/22 1757 18     Temp 10/27/22 1757 98.5 F (36.9 C)     Temp src --      SpO2 10/27/22 1757 96 %     Weight --      Height --      Head Circumference --      Peak Flow --       Pain Score 10/27/22 1754 9     Pain Loc --      Pain Edu? --      Excl. in [place]? --    No data found.  Updated Vital Signs BP 123/75   Pulse (!) 102   Temp 98.5 F (36.9 C)   Resp 18   SpO2 96%   Visual Acuity Right Eye Distance:   Left Eye Distance:   Bilateral Distance:    Right Eye Near:   Left Eye Near:    Bilateral Near:     Physical Exam Vitals reviewed.  Constitutional:      General: She is not in acute distress.    Appearance: She is not ill-appearing, toxic-appearing or diaphoretic.  HENT:     Right Ear: Tympanic membrane and ear canal normal.     Left Ear: Tympanic membrane and ear canal normal.     Nose: Congestion present.     Mouth/Throat:     Mouth: Mucous membranes are moist.     Comments: There is no erythema of the oropharynx and there is clear mucus draining.  Tonsils are 1+ size.  There is no asymmetry Eyes:     Extraocular Movements: Extraocular movements intact.     Conjunctiva/sclera: Conjunctivae normal.     Pupils: Pupils are equal, round, and reactive to light.  Cardiovascular:     Rate and Rhythm: Normal rate and regular rhythm.     Heart sounds: No murmur heard. Pulmonary:     Effort: No respiratory distress.     Breath sounds: No stridor. No wheezing, rhonchi or rales.  Musculoskeletal:     Cervical back: Neck supple.  Lymphadenopathy:     Cervical: No cervical adenopathy.  Skin:    Capillary Refill: Capillary refill takes less than 2 seconds.     Coloration: Skin is not jaundiced or pale.  Neurological:     General: No focal deficit present.     Mental Status: She is  alert and oriented to person, place, and time.  Psychiatric:        Behavior: Behavior normal.      UC Treatments / Results  Labs (all labs ordered are listed, but only abnormal results are displayed) Labs Reviewed  SARS CORONAVIRUS 2 (TAT 6-24 HRS)  CULTURE, GROUP A STREP [facility])  POCT RAPID STREP A, ED / UC    EKG   Radiology No results  found.  Procedures Procedures (including critical care time)  Medications Ordered in UC Medications - No data to display  Initial Impression / Assessment and Plan / UC Course  I have reviewed the triage vital signs and the nursing notes.  Pertinent labs & imaging results that were available during my care of the patient were reviewed by me and considered in my medical decision making (see chart for details).        Strep test is negative, so we will send throat culture and treat per protocol if that is positive.  We will swab for COVID, and she is a candidate for Paxlovid if positive.  Last EGFR was normal at greater than 60  Has to follow-up with her primary care about the shortness of breath and possible anxiety Final Clinical Impressions(s) / UC Diagnoses   Final diagnoses:  Pharyngitis, unspecified etiology  Viral upper respiratory tract infection     Discharge Instructions      Your strep test is negative.  Culture of the throat will be sent, and staff will notify you if that is in turn positive.   Take benzonatate 100 mg, 1 tab every 8 hours as needed for cough.   You have been swabbed for COVID, and the test will result in the next 24 hours. Our staff will call you if positive. If the COVID test is positive, you should quarantine for 5 days from the start of your symptoms      ED Prescriptions     Medication Sig Dispense Auth. Provider   benzonatate (TESSALON) 100 MG capsule Take 1 capsule (100 mg total) by mouth 3 (three) times daily as needed for cough. 21 capsule Barrett Henle, MD      PDMP not reviewed this encounter.   Barrett Henle, MD 10/27/22 [phone]

## 2022-10-27 NOTE — Discharge Instructions (Addendum)
Your strep test is negative.  Culture of the throat will be sent, and staff will notify you if that is in turn positive.   Take benzonatate 100 mg, 1 tab every 8 hours as needed for cough.  Cepacol for the throat pain.   You have been swabbed for COVID, and the test will result in the next 24 hours. Our staff will call you if positive. If the COVID test is positive, you should quarantine for 5 days from the start of your symptoms

## 2022-10-28 LAB — SARS CORONAVIRUS 2 (TAT 6-24 HRS): SARS Coronavirus 2: NEGATIVE

## 2022-10-29 ENCOUNTER — Ambulatory Visit: Payer: Commercial Managed Care - HMO | Admitting: Nurse Practitioner

## 2022-10-30 LAB — CULTURE, GROUP A STREP (THRC)

## 2022-11-05 ENCOUNTER — Encounter: Payer: Commercial Managed Care - HMO | Admitting: Podiatry

## 2022-11-12 ENCOUNTER — Telehealth: Payer: Self-pay | Admitting: Podiatry

## 2022-11-12 ENCOUNTER — Encounter: Payer: Commercial Managed Care - HMO | Admitting: Podiatry

## 2022-11-12 NOTE — Telephone Encounter (Signed)
Pt called and cxled the appt for today due to her mom is in the icu due to stroke. She states that she is still having pain and was not able to take the prescription pain medication was only taking ibuprofen and that is not working any longer. She is also having swelling esp when she is up walking around like you recommended. She apologizes for canceling . Her next post op is 1.2.2023 is that ok?

## 2022-11-26 ENCOUNTER — Ambulatory Visit (INDEPENDENT_AMBULATORY_CARE_PROVIDER_SITE_OTHER): Payer: Self-pay | Admitting: Podiatry

## 2022-11-26 DIAGNOSIS — M6788 Other specified disorders of synovium and tendon, other site: Secondary | ICD-10-CM

## 2022-11-26 MED ORDER — METHYLPREDNISOLONE 4 MG PO TBPK: ORAL_TABLET | ORAL | 0 refills | Status: DC

## 2022-11-26 NOTE — Progress Notes (Signed)
  Subjective:  Patient ID: Ann Taylor, female    DOB: May 16, 1981,  MRN: [id number]  Chief Complaint  Patient presents with   Routine Post Op    POV #3 DOS 10/11/2022 TOPAZ PROCEDURE & PRP INJECTION RT ANKLE -she is still in a lot of pain in her foot - her mother just passed away last week    42 y.o. female returns for post-op check.  Still having quite a bit of pain Review of Systems: Negative except as noted in the HPI. Denies N/V/F/Ch.   Objective:  There were no vitals filed for this visit. There is no height or weight on file to calculate BMI. Constitutional Well developed. Well nourished.  Vascular Foot warm and well perfused. Capillary refill normal to all digits.  Calf is soft and supple, no posterior calf or knee pain, negative Homans' sign  Neurologic Normal speech. Oriented to person, place, and time. Epicritic sensation to light touch grossly present bilaterally.  Dermatologic Skin is well-healed  Orthopedic: Tenderness to palpation around the peroneus brevis and longus tendon and along the sural nerve    Assessment:   1. Peroneal tendinosis, right    Plan:  Patient was evaluated and treated and all questions answered.  S/p foot surgery right -So far has not had much improvement or relief despite her PRP injection and Topaz procedure.  Continues to worsen.  I recommended a repeat MRI to evaluate and compare to her previous study done in 2023.  With may need this for open surgical planning.  I also recommended corticosteroid injection along the peroneal tendons and sural nerve today and this was completed with 4 mg of dexamethasone and 0.5 cc of 0.5% Marcaine plain.  She tolerated this well. -She may continue WBAT with the Tri-Lock ankle brace -Rx methylprednisolone taper sent for  Return for after MRI to review.

## 2022-11-26 NOTE — Patient Instructions (Signed)
Call [facility] to schedule your MRI at the below locations.  Please allow at least 1 business day after your visit to process the referral.  It may take longer depending on approval from insurance.  Please let me know if you have issues or problems scheduling the MRI   [facility] [phone] [address] [place], [place] [address]  [facility] [phone] [address] , [place] [address]  

## 2022-11-27 ENCOUNTER — Telehealth: Payer: Self-pay | Admitting: Podiatry

## 2022-11-27 NOTE — Telephone Encounter (Signed)
Pt is wanting to know when she is able to return to work so that she can let her employer know. She states that she will need a letter to turn in before she can return to work. Pt mentioned that she is stil waiting to do a MRI and that sha cannot walk on her foot.  Please advise.

## 2022-11-28 ENCOUNTER — Encounter: Payer: Self-pay | Admitting: Podiatry

## 2022-12-03 ENCOUNTER — Encounter: Payer: 59 | Attending: Physical Medicine & Rehabilitation | Admitting: Physical Medicine & Rehabilitation

## 2022-12-04 DIAGNOSIS — R300 Dysuria: Secondary | ICD-10-CM | POA: Diagnosis not present

## 2022-12-13 ENCOUNTER — Encounter: Payer: Self-pay | Admitting: Podiatry

## 2022-12-16 ENCOUNTER — Other Ambulatory Visit: Payer: Commercial Managed Care - HMO

## 2022-12-26 ENCOUNTER — Ambulatory Visit (HOSPITAL_COMMUNITY): Payer: 59

## 2022-12-31 ENCOUNTER — Ambulatory Visit: Payer: Commercial Managed Care - HMO | Admitting: Nurse Practitioner

## 2023-01-01 ENCOUNTER — Telehealth: Payer: Self-pay | Admitting: Podiatry

## 2023-01-01 NOTE — Telephone Encounter (Signed)
Patient called regarding appt for MRI. Patient stated appointment was cancelled due to non completion on our end.   Patient also stated she lost her foot brace while cleaning her house.   Transferred patient to triage nurse regarding brace.   Please advise.

## 2023-01-01 NOTE — Telephone Encounter (Signed)
Left a detailed vm advising the patientto give [place] a call back giving [place] a reason as to why her MRI was canceled.

## 2023-01-05 ENCOUNTER — Other Ambulatory Visit: Payer: Self-pay

## 2023-01-05 ENCOUNTER — Encounter (HOSPITAL_COMMUNITY): Payer: Self-pay

## 2023-01-05 ENCOUNTER — Ambulatory Visit (INDEPENDENT_AMBULATORY_CARE_PROVIDER_SITE_OTHER): Payer: 59

## 2023-01-05 ENCOUNTER — Ambulatory Visit (HOSPITAL_COMMUNITY)
Admission: RE | Admit: 2023-01-05 | Discharge: 2023-01-05 | Disposition: A | Discharge status: Home / Self Care | Payer: 59 | Source: Ambulatory Visit | Attending: Physician Assistant | Admitting: Physician Assistant

## 2023-01-05 VITALS — BP 112/77 | HR 80 | Temp 98.1°F | Resp 18

## 2023-01-05 DIAGNOSIS — R0789 Other chest pain: Secondary | ICD-10-CM | POA: Diagnosis not present

## 2023-01-05 DIAGNOSIS — Z111 Encounter for screening for respiratory tuberculosis: Secondary | ICD-10-CM | POA: Diagnosis not present

## 2023-01-05 DIAGNOSIS — R7611 Nonspecific reaction to tuberculin skin test without active tuberculosis: Secondary | ICD-10-CM | POA: Diagnosis not present

## 2023-01-05 DIAGNOSIS — R079 Chest pain, unspecified: Secondary | ICD-10-CM | POA: Diagnosis not present

## 2023-01-05 MED ORDER — PREDNISONE 20 MG PO TABS: 40.0000 mg | ORAL_TABLET | Freq: Every day | ORAL | 0 refills | Status: AC

## 2023-01-05 NOTE — ED Provider Notes (Signed)
[place]    CSN: [id number] Arrival date & time: 01/05/23  1518      History   Chief Complaint Chief Complaint  Patient presents with   Appointment    3:30    HPI Ann Taylor is a 42 y.o. female.   Patient presents today for several concerns.  Her primary concern today is to get a chest x-ray.  She was tested for TB at her place of employment and told that this is potentially positive.  She does have an erythematous lesion on her left forearm where PPD was placed but reports that this was never raised.  She is never had a positive PPD in the past.  Denies any specific exposure to TB.  Denies any weight loss, night sweats, hemoptysis, severe cough.  Her other reason for evaluation is a 2-week history of intermittent anterior chest wall pain.  She denies any known injury, increased activity prior to symptom onset.  She reports that pain occurs multiple times a day and then last for a few seconds before resolving.  She describes pain as sharp/stabbing, localized to either the right or left anterior chest wall, no identifiable trigger.  She denies taking any medication to help manage her symptoms.  She denies any significant shortness of breath but will occasionally have some dyspnea with pain.  She denies any pleuritic association with pain.  Denies any recent illness including COVID.  She denies history of asthma or COPD.    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 Authorizing Provider  predniSONE (DELTASONE) 20 MG tablet Take 2 tablets (40 mg total) by mouth daily for 4 days. 01/05/23 01/09/23 Yes Renada Cronin K, PA-C  pregabalin (LYRICA) 75 MG capsule Take 75 mg by mouth 2 (two) times daily. Patient not taking: Reported on 01/05/2023 04/16/22   [provider]  triamterene-hydrochlorothiazide (DYAZIDE) 37.5-25 MG capsule Take 1 each (1 capsule total) by mouth daily. 09/24/22   Constant, Peggy, MD    Family History Family History  Problem Relation Age of Onset   Alcohol abuse Neg Hx    Arthritis Neg Hx    Asthma Neg Hx    Birth defects Neg Hx    Cancer Neg Hx    COPD Neg Hx    Depression Neg Hx    Diabetes Neg Hx    Drug abuse Neg Hx    Early death Neg Hx  Hearing loss Neg Hx    Heart disease Neg Hx    Hyperlipidemia Neg Hx    Hypertension Neg Hx    Kidney disease Neg Hx    Learning disabilities Neg Hx    Mental illness Neg Hx    Mental retardation Neg Hx    Miscarriages / Stillbirths Neg Hx    Stroke Neg Hx    Vision loss Neg Hx     Social History Social History   Tobacco Use   Smoking status: Never   Smokeless tobacco: Never  Vaping Use   Vaping Use: Never used  Substance Use Topics   Alcohol use: No   Drug use: No     Allergies   Oxycodone, Shellfish allergy, Shellfish-derived products, Codone [hydrocodone], Codeine, and Tramadol   Review of Systems Review of Systems  Constitutional:  Positive for activity change. Negative for appetite change, chills, fatigue, fever and unexpected weight change.  Respiratory:  Positive for shortness of breath. Negative for cough.   Cardiovascular:  Positive for chest pain (chest wall). Negative for palpitations and leg swelling.   Gastrointestinal:  Negative for abdominal pain, diarrhea, nausea and vomiting.     Physical Exam Triage Vital Signs ED Triage Vitals  Enc Vitals Group     BP 01/05/23 1538 112/77     Pulse Rate 01/05/23 1538 80     Resp 01/05/23 1538 18     Temp 01/05/23 1538 98.1 F (36.7 C)     Temp Source 01/05/23 1538 Oral     SpO2 01/05/23 1538 97 %     Weight --      Height --      Head Circumference --      Peak Flow --      Pain Score 01/05/23 1534 10     Pain Loc --      Pain Edu? --      Excl. in [place]? --    No data found.  Updated Vital Signs BP 112/77 (BP Location: Right Arm)   Pulse 80   Temp 98.1 F (36.7 C) (Oral)   Resp 18   SpO2 97%   Visual Acuity Right Eye Distance:   Left Eye Distance:   Bilateral Distance:    Right Eye Near:   Left Eye Near:    Bilateral Near:     Physical Exam Vitals reviewed.  Constitutional:      General: She is awake. She is not in acute distress.    Appearance: Normal appearance. She is well-developed. She is not ill-appearing.     Comments: Very pleasant female appears stated age in no acute distress sitting comfortably in exam room  HENT:     Head: Normocephalic and atraumatic.  Cardiovascular:     Rate and Rhythm: Normal rate and regular rhythm.     Heart sounds: Normal heart sounds, S1 normal and S2 normal. No murmur heard. Pulmonary:     Effort: Pulmonary effort is normal.     Breath sounds: Normal breath sounds. No wheezing, rhonchi or rales.     Comments: Clear to auscultation bilaterally Chest:     Chest wall: No deformity, swelling or tenderness.     Comments: Pain not reproducible on exam Abdominal:     Palpations: Abdomen is soft.     Tenderness: There is no abdominal tenderness.  Musculoskeletal:     Right lower leg: No edema.     Left lower leg: No edema.  Skin:    Comments: 12  mm erythematous lesion noted to left forearm without induration.  Psychiatric:        Behavior: Behavior is cooperative.      UC  Treatments / Results  Labs (all labs ordered are listed, but only abnormal results are displayed) Labs Reviewed - No data to display  EKG   Radiology DG Chest 2 View  Result Date: 01/05/2023 CLINICAL DATA:  Chest pain.  Positive PPD. EXAM: CHEST - 2 VIEW COMPARISON:  05/19/2017 FINDINGS: The cardiomediastinal silhouette is unremarkable. There is no evidence of focal airspace disease, pulmonary edema, suspicious pulmonary nodule/mass, pleural effusion, or pneumothorax. No acute bony abnormalities are identified. IMPRESSION: No active cardiopulmonary disease. Electronically Signed   By: Margarette Canada M.D.   On: 01/05/2023 16:29    Procedures Procedures (including critical care time)  Medications Ordered in UC Medications - No data to display  Initial Impression / Assessment and Plan / UC Course  I have reviewed the triage vital signs and the nursing notes.  Pertinent labs & imaging results that were available during my care of the patient were reviewed by me and considered in my medical decision making (see chart for details).     Patient is well-appearing, afebrile, nontoxic, nontachycardic.  EKG obtained showed normal sinus rhythm with ventricular rate of 69 bpm without ischemic changes; compared to 05/20/2017 tracing nonspecific ST changes in V2.  HEAR score of 1 (based on risk factors of hypertension) making cardiac etiology low likelihood.  Chest x-ray was obtained which showed no acute cardiopulmonary disease or evidence of TB.  Discussed symptoms are most likely consistent with costochondritis or chest wall discomfort given its character and association with activity.  Will try prednisone burst of 40 mg for 4 days.  Discussed that she is not to take NSAIDs with this medication.  Recommended close follow-up with her primary care she may need additional testing if symptoms or not improving.  I also gave her the information for a cardiologist in case symptoms persist.  Discussed that if she  has any worsening symptoms including chest pain, shortness of breath, nausea/vomiting, weakness that she should be seen immediately.  Strict return precautions given.  Final Clinical Impressions(s) / UC Diagnoses   Final diagnoses:  Atypical chest pain  Screening examination for pulmonary tuberculosis     Discharge Instructions      Your EKG was reassuring.  Your chest x-ray showed no abnormalities including no evidence of tuberculosis.  I believe that the pain in your chest is related to muscles since it is related to movement.  Were going to try a prednisone burst of 40 mg for 4 days to help with your pain and inflammation.  Do not take NSAIDs with this medication including aspirin, ibuprofen/Advil, naproxen/Aleve.  You can use acetaminophen/Tylenol for breakthrough pain.  Make sure that you are not resting and drinking plenty of fluid.  If your symptoms or not improving please follow-up with your primary care as you may benefit from more advanced testing.  If you have any worsening symptoms including persistent chest pain, shortness of breath, lightheadedness, nausea/vomiting, weakness.     ED Prescriptions     Medication Sig Dispense Auth. Provider   predniSONE (DELTASONE) 20 MG tablet Take 2 tablets (40 mg total) by mouth daily for 4 days. 8 tablet Naeemah Jasmer, Derry Skill, PA-C      PDMP not reviewed this encounter.   Terrilee Croak, PA-C 01/05/23 1642

## 2023-01-05 NOTE — ED Triage Notes (Addendum)
Job requesting a chest xray due to patient having a positive TB test.  Reports this occurred on Friday.    Reports sharp chest pain , across chest for 2 weeks.  Described as intermittent and worse with movement

## 2023-01-05 NOTE — Discharge Instructions (Addendum)
Your EKG was reassuring.  Your chest x-ray showed no abnormalities including no evidence of tuberculosis.  I believe that the pain in your chest is related to muscles since it is related to movement.  Were going to try a prednisone burst of 40 mg for 4 days to help with your pain and inflammation.  Do not take NSAIDs with this medication including aspirin, ibuprofen/Advil, naproxen/Aleve.  You can use acetaminophen/Tylenol for breakthrough pain.  Make sure that you are not resting and drinking plenty of fluid.  If your symptoms or not improving please follow-up with your primary care as you may benefit from more advanced testing.  If you have any worsening symptoms including persistent chest pain, shortness of breath, lightheadedness, nausea/vomiting, weakness.

## 2023-01-07 ENCOUNTER — Telehealth: Payer: Self-pay | Admitting: *Deleted

## 2023-01-07 NOTE — Telephone Encounter (Signed)
Patient is calling to request another ankle brace, misplaced one previously given, please advise.

## 2023-01-07 NOTE — Telephone Encounter (Signed)
She said she doesn't have [employer] anymore and had full medicaid now when I talked to her last week

## 2023-01-07 NOTE — Telephone Encounter (Signed)
Noted, thanks!

## 2023-01-07 NOTE — Telephone Encounter (Signed)
Patient is calling for MRI status, and to update information in chart, returned call , no answer, left voice message to call back. She is wanting to update her medicaid information so that she can get MRI rescheduled[email or web address] .

## 2023-01-08 NOTE — Telephone Encounter (Signed)
Patient is going to order the brace off [employer]

## 2023-01-14 DIAGNOSIS — M461 Sacroiliitis, not elsewhere classified: Secondary | ICD-10-CM | POA: Diagnosis not present

## 2023-01-17 ENCOUNTER — Ambulatory Visit (INDEPENDENT_AMBULATORY_CARE_PROVIDER_SITE_OTHER): Payer: 59 | Admitting: Orthopaedic Surgery

## 2023-01-17 DIAGNOSIS — M25571 Pain in right ankle and joints of right foot: Secondary | ICD-10-CM | POA: Diagnosis not present

## 2023-01-17 DIAGNOSIS — M6701 Short Achilles tendon (acquired), right ankle: Secondary | ICD-10-CM

## 2023-01-17 NOTE — Progress Notes (Signed)
Chief Complaint: Right ankle pain and tenderness     History of Present Illness:    Ann Taylor is a 42 y.o. female presents today with right ankle pain and tenderness that has been ongoing for several years.  She does have a history of a fracture of the ankle which she was managed in a cam boot.  She was in this for several months.  She is here today she is persistently having posterior based ankle pain that is extremely tight.  This bothers her and radiates up the leg.  This is extremely difficult for her to work during the day.  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    Surgical History:   none  PMH/PSH/Family History/Social History/Meds/Allergies:    Past Medical History:  Diagnosis Date   Arrhythmia    Headache(784.0)    Heart murmur    UTI (lower urinary tract infection)    Past Surgical History:  Procedure Laterality Date   CESAREAN SECTION     Social History   Socioeconomic History   Marital status: Single    Spouse name: Not on file   Number of children: Not on file   Years of education: Not on file   Highest education level: Not on file  Occupational History   Not on file  Tobacco Use   Smoking status: Never   Smokeless tobacco: Never  Vaping Use   Vaping Use: Never used  Substance and Sexual Activity   Alcohol use: No   Drug use: No   Sexual activity: Yes    Birth control/protection: Condom  Other Topics Concern   Not on file  Social History Narrative   Not on file   Social Determinants of Health   Financial Resource Strain: Not on file  Food Insecurity: Not on file  Transportation Needs: Not on file  Physical Activity: Not on file  Stress: Not on file  Social Connections: Not on file   Family History  Problem Relation Age of Onset   Alcohol abuse Neg Hx    Arthritis Neg Hx    Asthma Neg Hx    Birth defects Neg Hx    Cancer Neg Hx    COPD Neg Hx    Depression Neg Hx    Diabetes Neg Hx    Drug  abuse Neg Hx    Early death Neg Hx    Hearing loss Neg Hx    Heart disease Neg Hx    Hyperlipidemia Neg Hx    Hypertension Neg Hx    Kidney disease Neg Hx    Learning disabilities Neg Hx    Mental illness Neg Hx    Mental retardation Neg Hx    Miscarriages / Stillbirths Neg Hx    Stroke Neg Hx    Vision loss Neg Hx    Allergies  Allergen Reactions   Oxycodone Hives and Itching   Shellfish Allergy Anaphylaxis   Shellfish-Derived Products Anaphylaxis   Codone [Hydrocodone] Itching   Codeine Hives, Itching and Rash   Tramadol Hives, Itching and Anxiety   Current Outpatient Medications  Medication Sig Dispense Refill   pregabalin (LYRICA) 75 MG capsule Take 75 mg by mouth 2 (two) times daily. (Patient not taking: Reported on 01/05/2023)     triamterene-hydrochlorothiazide (DYAZIDE) 37.5-25 MG capsule Take  1 each (1 capsule total) by mouth daily. 15 capsule 0   No current facility-administered medications for this visit.   No results found.  Review of Systems:   A ROS was performed including pertinent positives and negatives as documented in the HPI.  Physical Exam :   Constitutional: NAD and appears stated age Neurological: Alert and oriented Psych: Appropriate affect and cooperative There were no vitals taken for this visit.   Comprehensive Musculoskeletal Exam:    There is tenderness with significant Achilles contraction compared to the contralateral side.  She is essentially dorsiflex to neutral at a maximum.  There is tenderness that does radiate up the Achilles.  Achilles is overall in continuity  Imaging:   Xray (3 views right ankle): Normal   I personally reviewed and interpreted the radiographs.   Assessment:   42 y.o. female with significant right Achilles tendinitis and contraction which is likely a secondary result of prolonged cam boot usage after previous injury.  That effect I did describe that I would like to start with conservative treatment.  I do  believe she would benefit from physical therapy for good stretching program specifically in the heated pool to work on Achilles stretching.  We did also discuss that her I do believe she would benefit from a referral to Dr. Rolena Infante for shock therapy of her heel to hopefully get her some initial relief and allow her to work through physical therapy.  I would like her to make an appointment to see me in 2 months and at that point we would potentially discuss Achilles release as needed although I would like to start with conservative management first  Plan :    -Return to clinic in 2 months as needed     I personally saw and evaluated the patient, and participated in the management and treatment plan.  Vanetta Mulders, MD Attending Physician, Orthopedic Surgery  This document was dictated using Dragon voice recognition software. A reasonable attempt at proof reading has been made to minimize errors.

## 2023-02-03 ENCOUNTER — Emergency Department (HOSPITAL_COMMUNITY): Payer: 59

## 2023-02-03 ENCOUNTER — Other Ambulatory Visit: Payer: Self-pay

## 2023-02-03 ENCOUNTER — Encounter (HOSPITAL_COMMUNITY): Payer: Self-pay | Admitting: Emergency Medicine

## 2023-02-03 ENCOUNTER — Emergency Department (HOSPITAL_COMMUNITY)
Admission: EM | Admit: 2023-02-03 | Discharge: 2023-02-03 | Disposition: A | Discharge status: Home / Self Care | Payer: 59 | Attending: Emergency Medicine | Admitting: Emergency Medicine

## 2023-02-03 DIAGNOSIS — R0789 Other chest pain: Secondary | ICD-10-CM | POA: Diagnosis not present

## 2023-02-03 DIAGNOSIS — R079 Chest pain, unspecified: Secondary | ICD-10-CM | POA: Diagnosis not present

## 2023-02-03 LAB — CBC WITH DIFFERENTIAL/PLATELET
Abs Immature Granulocytes: 0.03 10*3/uL (ref 0.00–0.07)
Basophils Absolute: 0 10*3/uL (ref 0.0–0.1)
Basophils Relative: 0 %
Eosinophils Absolute: 0.1 10*3/uL (ref 0.0–0.5)
Eosinophils Relative: 1 %
HCT: 39.5 % (ref 36.0–46.0)
Hemoglobin: 12.8 g/dL (ref 12.0–15.0)
Immature Granulocytes: 0 %
Lymphocytes Relative: 31 %
Lymphs Abs: 2.9 10*3/uL (ref 0.7–4.0)
MCH: 27.4 pg (ref 26.0–34.0)
MCHC: 32.4 g/dL (ref 30.0–36.0)
MCV: 84.6 fL (ref 80.0–100.0)
Monocytes Absolute: 1.2 10*3/uL — ABNORMAL HIGH (ref 0.1–1.0)
Monocytes Relative: 13 %
Neutro Abs: 5.2 10*3/uL (ref 1.7–7.7)
Neutrophils Relative %: 55 %
Platelets: 346 10*3/uL (ref 150–400)
RBC: 4.67 MIL/uL (ref 3.87–5.11)
RDW: 14.6 % (ref 11.5–15.5)
WBC: 9.4 10*3/uL (ref 4.0–10.5)
nRBC: 0 % (ref 0.0–0.2)

## 2023-02-03 LAB — BASIC METABOLIC PANEL
Anion gap: 13 (ref 5–15)
BUN: 11 mg/dL (ref 6–20)
CO2: 20 mmol/L — ABNORMAL LOW (ref 22–32)
Calcium: 9 mg/dL (ref 8.9–10.3)
Chloride: 105 mmol/L (ref 98–111)
Creatinine, Ser: 0.81 mg/dL (ref 0.44–1.00)
GFR, Estimated: >60 mL/min (ref >60)
Glucose, Bld: 101 mg/dL — ABNORMAL HIGH (ref 70–99)
Potassium: 3 mmol/L — ABNORMAL LOW (ref 3.5–5.1)
Sodium: 138 mmol/L (ref 135–145)

## 2023-02-03 LAB — HCG, QUANTITATIVE, PREGNANCY: hCG, Beta Chain, Quant, S: <1 m[IU]/mL (ref <5)

## 2023-02-03 LAB — I-STAT BETA HCG BLOOD, ED (MC, WL, AP ONLY): I-stat hCG, quantitative: 13.9 m[IU]/mL — ABNORMAL HIGH (ref <5)

## 2023-02-03 LAB — TROPONIN I (HIGH SENSITIVITY): Troponin I (High Sensitivity): 3 ng/L (ref <18)

## 2023-02-03 MED ORDER — POTASSIUM CHLORIDE CRYS ER 20 MEQ PO TBCR
40.0000 meq | EXTENDED_RELEASE_TABLET | Freq: Once | ORAL | Status: AC
  Administered 2023-02-03: 40 meq via ORAL
  Filled 2023-02-03: qty 2

## 2023-02-03 NOTE — ED Triage Notes (Signed)
Pt here from home with c/o chest pain off and n for 2 months, some  sob pain is worse with movement , pt does have some anxiety about her health

## 2023-02-03 NOTE — Discharge Instructions (Signed)
Return for any problem.  ?

## 2023-02-03 NOTE — ED Provider Notes (Signed)
[facility] Provider Note   CSN: [id number] Arrival date & time: 02/03/23  0857     History  Chief Complaint  Patient presents with   Chest Pain    Ann Taylor is a 42 y.o. female.  42 year old female with prior medical detailed options for evaluation.  Patient reports near continuous anterior chest pain for the last 2 to 3 weeks.  Patient reports significant anxiety at baseline.  Patient reports that she is concerned that her chest pain may be from her heart.  Patient reports that she is been told by her PCP and other providers that her chest pain is most likely secondary to her anxiety.  She denies associated nausea, vomiting, diaphoresis, shortness of breath, fevers, other complaint.  She denies prior history of cardiac disease.  She reports prior evaluations by both PCP and cardiology in the past.    The history is provided by the patient and medical records.       Home Medications Prior to Admission medications   Medication Sig Start Date End Date Taking? Authorizing Provider  pregabalin (LYRICA) 75 MG capsule Take 75 mg by mouth 2 (two) times daily. Patient not taking: Reported on 01/05/2023 04/16/22   [provider]  triamterene-hydrochlorothiazide (DYAZIDE) 37.5-25 MG capsule Take 1 each (1 capsule total) by mouth daily. 09/24/22   Constant, Peggy, MD      Allergies    Oxycodone, Shellfish allergy, Shellfish-derived products, Codone [hydrocodone], Codeine, and Tramadol    Review of Systems   Review of Systems  All other systems reviewed and are negative.   Physical Exam Updated Vital Signs BP (!) 114/94   Pulse 93   Temp 98.2 F (36.8 C) (Oral)   Resp 10   SpO2 98%  Physical Exam Vitals and nursing note reviewed.  Constitutional:      General: She is not in acute distress.    Appearance: Normal appearance. She is well-developed.  HENT:     Head: Normocephalic and atraumatic.  Eyes:      Conjunctiva/sclera: Conjunctivae normal.     Pupils: Pupils are equal, round, and reactive to light.  Cardiovascular:     Rate and Rhythm: Normal rate and regular rhythm.     Heart sounds: Normal heart sounds.  Pulmonary:     Effort: Pulmonary effort is normal. No respiratory distress.     Breath sounds: Normal breath sounds.  Abdominal:     General: There is no distension.     Palpations: Abdomen is soft.     Tenderness: There is no abdominal tenderness.  Musculoskeletal:        General: No deformity. Normal range of motion.     Cervical back: Normal range of motion and neck supple.  Skin:    General: Skin is warm and dry.  Neurological:     General: No focal deficit present.     Mental Status: She is alert and oriented to person, place, and time.     ED Results / Procedures / Treatments   Labs (all labs ordered are listed, but only abnormal results are displayed) Labs Reviewed  CBC WITH DIFFERENTIAL/PLATELET - Abnormal; Notable for the following components:      Result Value   Monocytes Absolute 1.2 (*)    All other components within normal limits  BASIC METABOLIC PANEL - Abnormal; Notable for the following components:   Potassium 3.0 (*)    CO2 20 (*)    Glucose, Bld 101 (*)  All other components within normal limits  I-STAT BETA HCG BLOOD, ED ([facility], [facility], [facility] ONLY) - Abnormal; Notable for the following components:   I-stat hCG, quantitative 13.9 (*)    All other components within normal limits  HCG, QUANTITATIVE, PREGNANCY  TROPONIN I (HIGH SENSITIVITY)    EKG EKG Interpretation  Date/Time:  Monday February 03 2023 09:16:52 EDT Ventricular Rate:  85 PR Interval:  153 QRS Duration: 91 QT Interval:  379 QTC Calculation: 451 R Axis:   31 Text Interpretation: Sinus rhythm Abnormal R-wave progression, early transition Confirmed by Dene Gentry [phone]) on 02/03/2023 9:30:42 AM  Radiology DG Chest 2 View  Result Date: 02/03/2023 CLINICAL DATA:  Chest pain EXAM: CHEST  - 2 VIEW COMPARISON:  CXR 01/05/23 FINDINGS: No pleural effusion. No pneumothorax. No focal airspace opacity. Normal cardiac and mediastinal contours. Unchanged asymmetric elevation of the left hemidiaphragm. No radiographically apparent displaced rib fractures. Vertebral body heights are maintained. IMPRESSION: No radiographic finding to explain chest pain Electronically Signed   By: Marin Roberts M.D.   On: 02/03/2023 09:59    Procedures Procedures    Medications Ordered in ED Medications - No data to display  ED Course/ Medical Decision Making/ A&P                             Medical Decision Making Amount and/or Complexity of Data Reviewed Labs: ordered. Radiology: ordered.  Risk Prescription drug management.    Medical Screen Complete  This patient presented to the ED with complaint of chest pain, anxiety.  This complaint involves an extensive number of treatment options. The initial differential diagnosis includes, but is not limited to, ACS, angina, metabolic abnormality, etc.  This presentation is: Acute, Chronic, Self-Limited, Previously Undiagnosed, Uncertain Prognosis, Complicated, Systemic Symptoms, and Threat to Life/Bodily Function  Patient is presenting with complaint of nearly continuous daily chest pain x 2 to 3 weeks.  She is concerned that her pain could be cardiac in origin.  She denies prior history of cardiac disease.  Patient's describe symptoms are not typical of cardiac pathology.  EKG is without acute ischemia.  Troponin is barely detectable.  Other screening labs obtained are without significant abnormality.  Patient's potassium is mildly decreased.  Supplementation given.  Patient does understand need for close outpatient follow-up and recheck of potassium levels within the next 2 to 3 weeks.  Importance of close follow-up stressed.  Strict return precautions given and understood.  Patient is comfortable at time of discharge.  She is reassured  by ED evaluation today.   Additional history obtained:  External records from outside sources obtained and reviewed including prior ED visits and prior Inpatient records.    Lab Tests:  I ordered and personally interpreted labs.  The pertinent results include: CBC, BMP, troponin, hCG   Imaging Studies ordered:  I ordered imaging studies including chest x-ray I independently visualized and interpreted obtained imaging which showed NAD I agree with the radiologist interpretation.   Cardiac Monitoring:  The patient was maintained on a cardiac monitor.  I personally viewed and interpreted the cardiac monitor which showed an underlying rhythm of: NSR   Medicines ordered:  I ordered medication including potassium for hypokalemia Reevaluation of the patient after these medicines showed that the patient: Improved  Problem List / ED Course:  Atypical chest pain   Reevaluation:  After the interventions noted above, I reevaluated the patient and found that they have: improved  Disposition:  After consideration of the diagnostic results and the patients response to treatment, I feel that the patent would benefit from close outpatient follow-up.          Final Clinical Impression(s) / ED Diagnoses Final diagnoses:  Chest pain, unspecified type    Rx / DC Orders ED Discharge Orders     None         Valarie Merino, MD 02/03/23 1251

## 2023-02-12 ENCOUNTER — Encounter: Payer: Self-pay | Admitting: Family Medicine

## 2023-02-12 ENCOUNTER — Ambulatory Visit (INDEPENDENT_AMBULATORY_CARE_PROVIDER_SITE_OTHER): Payer: 59 | Admitting: Family Medicine

## 2023-02-12 VITALS — BP 118/78 | HR 85 | Ht 65.0 in | Wt 187.0 lb

## 2023-02-12 DIAGNOSIS — Z0000 Encounter for general adult medical examination without abnormal findings: Secondary | ICD-10-CM

## 2023-02-12 DIAGNOSIS — F39 Unspecified mood [affective] disorder: Secondary | ICD-10-CM

## 2023-02-12 DIAGNOSIS — I10 Essential (primary) hypertension: Secondary | ICD-10-CM

## 2023-02-12 DIAGNOSIS — M5410 Radiculopathy, site unspecified: Secondary | ICD-10-CM | POA: Diagnosis not present

## 2023-02-12 NOTE — Patient Instructions (Addendum)
It was wonderful to see you today.  Please bring ALL of your medications with you to every visit.   Today we talked about:  Sciatica - I believe this is the cause of your pain. Please try ibuprofen 800 mg three times a day. Try the exercises I have attached as well.   Therapy You can use the following website to start looking for a therapist. Remember, finding a therapist you click with it a lot like speed dating - you have to sort through a bunch of lemons before finding what you like!  [email or web address]  Please follow up in 1 months   Thank you for choosing [facility].   Please call [phone] with any questions about today's appointment.  Please be sure to schedule follow up at the front desk before you leave today.   Lowry Ram, MD  Family Medicine

## 2023-02-12 NOTE — Assessment & Plan Note (Signed)
Patient has history of depressive and anxious symptoms.  She has had history of chest pain due to anxiety.  Last seen on ED on 3/11 for this.  She has many stressors including chronic pain, her mother passed away 2 months ago, brother passed away recently as well.  She is a mother with 2 children.  Has been on therapy many years ago and had some relief.  Has tried to SSRIs including sertraline they did not work for her due to nightmares and other side effects. - Offered therapy resources, patient will attempt to find therapist - Follow-up in 1 month
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
## 2023-02-13 LAB — COMPREHENSIVE METABOLIC PANEL
ALT: 14 IU/L (ref 0–32)
AST: 18 IU/L (ref 0–40)
Albumin/Globulin Ratio: 1.6 (ref 1.2–2.2)
Albumin: 3.9 g/dL (ref 3.9–4.9)
Alkaline Phosphatase: 41 IU/L — ABNORMAL LOW (ref 44–121)
BUN/Creatinine Ratio: 13 (ref 9–23)
BUN: 9 mg/dL (ref 6–24)
Bilirubin Total: 0.3 mg/dL (ref 0.0–1.2)
CO2: 21 mmol/L (ref 20–29)
Calcium: 8.8 mg/dL (ref 8.7–10.2)
Chloride: 102 mmol/L (ref 96–106)
Creatinine, Ser: 0.67 mg/dL (ref 0.57–1.00)
Globulin, Total: 2.5 g/dL (ref 1.5–4.5)
Glucose: 86 mg/dL (ref 70–99)
Potassium: 3.8 mmol/L (ref 3.5–5.2)
Sodium: 140 mmol/L (ref 134–144)
Total Protein: 6.4 g/dL (ref 6.0–8.5)
eGFR: 113 mL/min/{1.73_m2} (ref >59)

## 2023-02-13 LAB — HCV AB W REFLEX TO QUANT PCR: HCV Ab: NONREACTIVE

## 2023-02-13 LAB — LIPID PANEL
Chol/HDL Ratio: 4.9 ratio — ABNORMAL HIGH (ref 0.0–4.4)
Cholesterol, Total: 171 mg/dL (ref 100–199)
HDL: 35 mg/dL — ABNORMAL LOW (ref >39)
LDL Chol Calc (NIH): 122 mg/dL — ABNORMAL HIGH (ref 0–99)
Triglycerides: 74 mg/dL (ref 0–149)
VLDL Cholesterol Cal: 14 mg/dL (ref 5–40)

## 2023-02-13 LAB — HCV INTERPRETATION

## 2023-03-03 ENCOUNTER — Other Ambulatory Visit: Payer: Self-pay

## 2023-03-03 ENCOUNTER — Ambulatory Visit (HOSPITAL_BASED_OUTPATIENT_CLINIC_OR_DEPARTMENT_OTHER): Payer: 59 | Attending: Orthopaedic Surgery | Admitting: Physical Therapy

## 2023-03-03 ENCOUNTER — Encounter (HOSPITAL_BASED_OUTPATIENT_CLINIC_OR_DEPARTMENT_OTHER): Payer: Self-pay | Admitting: Physical Therapy

## 2023-03-03 DIAGNOSIS — R262 Difficulty in walking, not elsewhere classified: Secondary | ICD-10-CM | POA: Insufficient documentation

## 2023-03-03 DIAGNOSIS — M6281 Muscle weakness (generalized): Secondary | ICD-10-CM | POA: Insufficient documentation

## 2023-03-03 DIAGNOSIS — M25571 Pain in right ankle and joints of right foot: Secondary | ICD-10-CM | POA: Insufficient documentation

## 2023-03-03 NOTE — Therapy (Signed)
OUTPATIENT PHYSICAL THERAPY LOWER EXTREMITY EVALUATION   Patient Name: ARMELLE MUCCI MRN: [id number] DOB:01-Feb-1981, 42 y.o., female Today's Date: 03/03/2023  END OF SESSION:  PT End of Session - 03/03/23 1020     Visit Number 1    Number of Visits 12    Date for PT Re-Evaluation 06/01/23    Authorization Type [employer]    PT Start Time 1015    Activity Tolerance Patient tolerated treatment well;Patient limited by pain    Behavior During Therapy [facility] for tasks assessed/performed             Past Medical History:  Diagnosis Date   Arrhythmia    Headache(784.0)    Heart murmur    UTI (lower urinary tract infection)    Past Surgical History:  Procedure Laterality Date   CESAREAN SECTION     Patient Active Problem List   Diagnosis Date Noted   Bilateral radiating leg pain 02/12/2023   Mood disorder 02/12/2023   Essential hypertension 07/28/2021   Family history of sickle cell anemia 07/28/2021   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 Pt had an ankle fracture in  2021 and was in a boot for several months. She states that she "never had it fixed" and just kept the boot on bc she could not miss work. Pt is always standing on her foot for work. 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 She is now having LBP due to the ankle pain. Pt has NT into the R LE from L/S to down and foot to up the leg. She cannot lay  supine due to the ankle. Has to lay sideways. L/S motions do not change ankle pain. Pt states she is only able to stand for 30-1hr mins at work and she has to sit down due to pain.  Pt reports previous therapy sessions did not directly work standing ankle strengthening exercise, focused on her back.  Denies cancer red flags.   PERTINENT HISTORY:  2021 lateral ankle fracture - missed step and step down  PAIN:  Are you having pain? Yes: NPRS scale: 9/10 Pain location: R posterior heel and up the calf Pain description: sharp, achey, numb, throbbing Aggravating factors: standing, cooking, cleaning  Relieving factors: heating, icing, resting  PRECAUTIONS: None  WEIGHT BEARING RESTRICTIONS: No  FALLS:  Has patient fallen in last 6 months? Yes. 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 Minimal bulging and  desiccation of the L5-S1 disc. No stenosis or neural compression. The other levels are normal  PATIENT SURVEYS:  FOTO 38 63 @ DC 19 pts MCII  COGNITION: Overall cognitive status: Within functional limits for tasks assessed     SENSATION: Light touch: Impaired   S1 dermatome  EDEMA:  Moderate, non-pitting edema around R medial and lateral ankle   POSTURE: R weight shift, R toe out; mild increase in lumbar lordosis in standing  PALPATION: TTP of R fibularis tendons, Achilles, and tarsal tunnel  LOWER EXTREMITY ROM:  Active ROM Right eval Left eval  Hip flexion [facility]  Hip extension [facility]  Hip abduction     Hip adduction    Hip internal rotation    Hip external rotation    Knee flexion [facility] WFL  Knee extension [facility] WFL  Ankle dorsiflexion -5 WFL  Ankle plantarflexion 45 WFL  Ankle inversion 25 WFL  Ankle eversion 10 WFL   (Blank rows = not tested)  LOWER EXTREMITY MMT: too painful at the R LE and ankle to test following AROM and palpation  LOWER EXTREMITY SPECIAL TESTS:  Ankle special tests: Anterior drawer test: negative and Great toe extension test: positive  Positive Straight Leg Raise  FUNCTIONAL TESTS:  Step up/down: WFL, painful with descent   SLS <15s on R  WNL on L   GAIT: Distance walked: 72ft Assistive device utilized: None Level of assistance: Complete Independence Comments: decrease R stance time, mild toe out, early heel off; painful with all phases of gait   TODAY'S TREATMENT:                                                                                                                              DATE: 03/03/23    Exercises - Gastroc Stretch on Wall  - 2 x daily - 7 x weekly - 1 sets - 3 reps - 30 hold - Single Leg Stance  - 1 x daily - 7 x weekly - 1 sets - 3 reps - 30 hold - Heel Raises with Counter Support  - 1 x daily - 7 x weekly - 3 sets - 10 reps   PATIENT EDUCATION:  Education details: MOI, diagnosis, prognosis, anatomy, exercise progression, DOMS expectations, muscle firing,  envelope of function, HEP, POC  Person educated: Patient Education method: Explanation, Demonstration, Tactile cues, Verbal cues, and Handouts Education comprehension: verbalized understanding, returned demonstration, verbal cues required, and tactile cues required  HOME EXERCISE PROGRAM:  Access Code: [email or web address] URL: [email or web address] Date: 03/03/2023 Prepared by: Zebedee Iba  ASSESSMENT:  CLINICAL IMPRESSION: Patient is a 42 y.o. female who was seen today for physical therapy evaluation and treatment for c/c of R ankle foot pain. Pt's s/s appear  consistent with R Achilles tendinopathy and generalized R ankle pain due to history of R ankle fracture and immobilization. Pt's pain is highly sensitive and irritable with movement. Pt's is more stiffness dominant at this time. Plan  to continue with R ankle ROM and endurance/stability at future sessions. Pt would benefit from continued skilled therapy in order to reach goals and maximize functional R LE strength and ROM for full return to PLOF. Marland Kitchen.    OBJECTIVE IMPAIRMENTS: Abnormal gait, decreased activity tolerance, decreased balance, decreased endurance, decreased mobility, difficulty walking, decreased ROM, decreased strength, hypomobility, increased muscle spasms, impaired flexibility, improper body mechanics, and pain.   ACTIVITY LIMITATIONS: carrying, lifting, standing, squatting, stairs, transfers, and locomotion level  PARTICIPATION LIMITATIONS: meal prep, cleaning, laundry, driving, shopping, community activity, occupation, yard work, and exercise  PERSONAL FACTORS: Fitness, Time since onset of injury/illness/exacerbation, and 1-2 comorbidities:    are also affecting patient's functional outcome.   REHAB POTENTIAL: Good  CLINICAL DECISION MAKING: Stable/uncomplicated  EVALUATION COMPLEXITY: Low   GOALS:  SHORT TERM GOALS: Target date: 04/14/2023  Pt will become independent with HEP in order to demonstrate synthesis of PT education. Baseline: Goal status: INITIAL  2.  Pt will be able to demonstrate ability to stand and walking on level surfaces without pain in order to demonstrate functional improvement in LE function for self-care and house hold duties.  Baseline:  Goal status: INITIAL  3.  Pt will score at least 19 pt increase on FOTO to demonstrate functional improvement in MCII and pt perceived function.   Baseline:  Goal status: INITIAL   LONG TERM GOALS: Target date: 05/26/2023   Pt  will become independent with final HEP in order to demonstrate synthesis of PT  education. Baseline:  Goal status: INITIAL  2.  Pt will score >/= 63 on FOTO to demonstrate improvement in perceived R LE function.  Baseline:  Goal status: INITIAL  3.  Pt will be able to demonstrate/report ability to walk >30 mins without pain in order to demonstrate functional improvement and tolerance to exercise and community mobility.  Baseline:  Goal status: INITIAL  4. Pt will be able to demonstrate ability to perform SLS >30s in order to demonstrate functional improvement in LE function for reduction of falls during occupational tasks.  Baseline:  Goal status: INITIAL     PLAN:  PT FREQUENCY: 1-2x/week  PT DURATION: 12 weeks  PLANNED INTERVENTIONS: Therapeutic exercises, Therapeutic activity, Neuromuscular re-education, Balance training, Gait training, Patient/Family education, Self Care, Joint mobilization, Joint manipulation, Stair training, Orthotic/Fit training, Aquatic Therapy, Dry Needling, Electrical stimulation, Spinal manipulation, Spinal mobilization, Cryotherapy, Moist heat, scar mobilization, Splintting, Taping, Vasopneumatic device, Traction, Ultrasound, Ionotophoresis 4mg /ml Dexamethasone, Manual therapy, and Re-evaluation  PLAN FOR NEXT SESSION: ankle TCJ joint mobilizations, stair step heel raise, airex balance/stability    Zebedee IbaAlan Arnell Mausolf, PT 03/03/2023, 1:00 PM

## 2023-03-05 ENCOUNTER — Ambulatory Visit (HOSPITAL_BASED_OUTPATIENT_CLINIC_OR_DEPARTMENT_OTHER): Payer: 59

## 2023-03-05 ENCOUNTER — Encounter (HOSPITAL_BASED_OUTPATIENT_CLINIC_OR_DEPARTMENT_OTHER): Payer: Self-pay

## 2023-03-05 DIAGNOSIS — R262 Difficulty in walking, not elsewhere classified: Secondary | ICD-10-CM | POA: Diagnosis not present

## 2023-03-05 DIAGNOSIS — M6281 Muscle weakness (generalized): Secondary | ICD-10-CM

## 2023-03-05 DIAGNOSIS — M25571 Pain in right ankle and joints of right foot: Secondary | ICD-10-CM

## 2023-03-05 NOTE — Therapy (Signed)
OUTPATIENT PHYSICAL THERAPY LOWER EXTREMITY EVALUATION   Patient Name: Ann Taylor MRN: [id number] DOB:02-14-1981, 42 y.o., female Today's Date: 03/05/2023  END OF SESSION:  PT End of Session - 03/05/23 1042     Visit Number 2    Number of Visits 12    Date for PT Re-Evaluation 06/01/23    Authorization Type [employer]et[employer]    PT Start Time 0930    PT Stop Time 1015    PT Time Calculation (min) 45 min    Activity Tolerance Patient tolerated treatment well    Behavior During Therapy [facility] for tasks assessed/performed             Past Medical History:  Diagnosis Date   Arrhythmia    Headache(784.0)    Heart murmur    UTI (lower urinary tract infection)    Past Surgical History:  Procedure Laterality Date   CESAREAN SECTION     Patient Active Problem List   Diagnosis Date Noted   Bilateral radiating leg pain 02/12/2023   Mood disorder 02/12/2023   Essential hypertension 07/28/2021   Family history of sickle cell anemia 07/28/2021   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   Neuropathy 07/28/2021   Other allergy, initial encounter 07/28/2021   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 Some soreness in ankle at entry.   PERTINENT HISTORY:  2021 lateral ankle fracture - missed step and step down  PAIN:  Are you having pain? Yes: NPRS scale: 9/10 Pain location: R posterior heel and up the calf Pain description: sharp, achey, numb, throbbing Aggravating factors: standing, cooking, cleaning  Relieving factors: heating, icing,  resting  PRECAUTIONS: None  WEIGHT BEARING RESTRICTIONS: No  FALLS:  Has patient fallen in last 6 months? Yes. 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 Minimal bulging and desiccation of the L5-S1 disc. No stenosis or neural compression. The other levels are normal  PATIENT SURVEYS:  FOTO 38 63 @ DC 19 pts MCII  COGNITION: Overall cognitive status: Within functional limits for tasks assessed     SENSATION: Light touch: Impaired   S1 dermatome  EDEMA:  Moderate, non-pitting edema around R medial and lateral ankle   POSTURE: R weight shift, R toe out; mild increase in lumbar lordosis in standing  PALPATION: TTP of R fibularis tendons, Achilles, and tarsal tunnel  LOWER EXTREMITY ROM:  Active ROM Right eval Left eval  Hip flexion [facility]  Hip extension [facility]  Hip abduction    Hip adduction    Hip internal rotation    Hip external  rotation    [facility]nee flexion [facility]  Knee extension [facility]  Ankle dorsiflexion -5 WFL  Ankle plantarflexion 45 WFL  Ankle inversion 25 WFL  Ankle eversion 10 WFL   (Blank rows = not tested)  LOWER EXTREMITY MMT: too painful at the R LE and ankle to test following AROM and palpation  LOWER EXTREMITY SPECIAL TESTS:  Ankle special tests: Anterior drawer test: negative and Great toe extension test: positive  Positive Straight Leg Raise  FUNCTIONAL TESTS:  Step up/down: WFL, painful with descent   SLS <15s on R  WNL on L   GAIT: Distance walked: 2560ft Assistive device  utilized: None Level of assistance: Complete Independence Comments: decrease R stance time, mild toe out, early heel off; painful with all phases of gait   TODAY'S TREATMENT:                                                                                                                               DATE: 03/05/23   -PROM R ankle -Talo crural and subtalar mobilizations grade II-III -STM to peroneals and gastroc -Taping: KT tape for gastroc inhibition, leukotape for neutral talar position -Gait training after tape application- hall x2 -Tandem balance on airex pad 30sec x2 R behind -Seated ankle D/F/PF and in/ev with balance board x20ea   DATE: 03/03/23    Exercises - Gastroc Stretch on Wall  - 2 x daily - 7 x weekly - 1 sets - 3 reps - 30 hold - Single Leg Stance  - 1 x daily - 7 x weekly - 1 sets - 3 reps - 30 hold - Heel Raises with Counter Support  - 1 x daily - 7 x weekly - 3 sets - 10 reps   PATIENT EDUCATION:  Education details: MOI, diagnosis, prognosis, anatomy, exercise progression, DOMS expectations, muscle firing,  envelope of function, HEP, POC  Person educated: Patient Education method: Explanation, Demonstration, Tactile cues, Verbal cues, and Handouts Education comprehension: verbalized understanding, returned demonstration, verbal cues required, and tactile cues required  HOME EXERCISE PROGRAM:  Access Code: [email or web address] URL: [email or web address] Date: 03/03/2023 Prepared by: Zebedee IbaAlan Zhou  ASSESSMENT:  CLINICAL IMPRESSION: Worked on PROM and joint mobilizations for R ankle to improve mobility. She in tender around lateral malleolus and Achilles area. STM performed to peroneals and gastroc/soleus to decrease restrictions. Trialed taping for subtalar neutral as pt demonstrates supination here at rest. She did report slight improvement following application and was educated on proper use and removal of tape. Will assess her response to this next visit and  progress as tolerated.  OBJECTIVE IMPAIRMENTS: Abnormal gait, decreased activity tolerance, decreased balance, decreased endurance, decreased mobility, difficulty walking, decreased ROM, decreased strength, hypomobility, increased muscle spasms, impaired flexibility, improper body mechanics, and pain.   ACTIVITY LIMITATIONS: carrying, lifting, standing, squatting, stairs, transfers, and locomotion level  PARTICIPATION LIMITATIONS: meal prep, cleaning, laundry, driving, shopping, community activity, occupation, yard work, and exercise  PERSONAL FACTORS:  Fitness, Time since onset of injury/illness/exacerbation, and 1-2 comorbidities:    are also affecting patient's functional outcome.   REHAB POTENTIAL: Good  CLINICAL DECISION MAKING: Stable/uncomplicated  EVALUATION COMPLEXITY: Low   GOALS:  SHORT TERM GOALS: Target date: 04/14/2023  Pt will become independent with HEP in order to demonstrate synthesis of PT education. Baseline: Goal status: INITIAL  2.  Pt will be able to demonstrate ability to stand and walking on level surfaces without pain in order to demonstrate functional improvement in LE function for self-care and house hold duties.  Baseline:  Goal status: INITIAL  3.  Pt will score at least 19 pt increase on FOTO to demonstrate functional improvement in MCII and pt perceived function.   Baseline:  Goal status: INITIAL   LONG TERM GOALS: Target date: 05/26/2023   Pt  will become independent with final HEP in order to demonstrate synthesis of PT education. Baseline:  Goal status: INITIAL  2.  Pt will score >/= 63 on FOTO to demonstrate improvement in perceived R LE function.  Baseline:  Goal status: INITIAL  3.  Pt will be able to demonstrate/report ability to walk >30 mins without pain in order to demonstrate functional improvement and tolerance to exercise and community mobility.  Baseline:  Goal status: INITIAL  4. Pt will be able to demonstrate ability to  perform SLS >30s in order to demonstrate functional improvement in LE function for reduction of falls during occupational tasks.  Baseline:  Goal status: INITIAL     PLAN:  PT FREQUENCY: 1-2x/week  PT DURATION: 12 weeks  PLANNED INTERVENTIONS: Therapeutic exercises, Therapeutic activity, Neuromuscular re-education, Balance training, Gait training, Patient/Family education, Self Care, Joint mobilization, Joint manipulation, Stair training, Orthotic/Fit training, Aquatic Therapy, Dry Needling, Electrical stimulation, Spinal manipulation, Spinal mobilization, Cryotherapy, Moist heat, scar mobilization, Splintting, Taping, Vasopneumatic device, Traction, Ultrasound, Ionotophoresis 4mg /ml Dexamethasone, Manual therapy, and Re-evaluation  PLAN FOR NEXT SESSION: ankle TCJ joint mobilizations, stair step heel raise, airex balance/stability    Riki Altes, PTA  03/05/2023, 11:48 AM

## 2023-03-13 ENCOUNTER — Ambulatory Visit (HOSPITAL_BASED_OUTPATIENT_CLINIC_OR_DEPARTMENT_OTHER): Payer: 59 | Admitting: Physical Therapy

## 2023-03-13 ENCOUNTER — Encounter (HOSPITAL_BASED_OUTPATIENT_CLINIC_OR_DEPARTMENT_OTHER): Payer: Self-pay | Admitting: Physical Therapy

## 2023-03-13 DIAGNOSIS — M25571 Pain in right ankle and joints of right foot: Secondary | ICD-10-CM

## 2023-03-13 DIAGNOSIS — M6281 Muscle weakness (generalized): Secondary | ICD-10-CM

## 2023-03-13 DIAGNOSIS — R262 Difficulty in walking, not elsewhere classified: Secondary | ICD-10-CM | POA: Diagnosis not present

## 2023-03-13 NOTE — Therapy (Signed)
OUTPATIENT PHYSICAL THERAPY LOWER EXTREMITY TREATMENT   Patient Name: MALEE GRAYS MRN: [id number] DOB:03/23/1981, 42 y.o., female Today's Date: 03/13/2023  END OF SESSION:  PT End of Session - 03/13/23 1008     Visit Number 3    Number of Visits 12    Date for PT Re-Evaluation 06/01/23    Authorization Type Aet[employer]    PT Start Time 1015    PT Stop Time 1055    PT Time Calculation (min) 40 min    Activity Tolerance Patient tolerated treatment well    Behavior During Therapy [facility] for tasks assessed/performed             Past Medical History:  Diagnosis Date   Arrhythmia    Headache(784.0)    Heart murmur    UTI (lower urinary tract infection)    Past Surgical History:  Procedure Laterality Date   CESAREAN SECTION     Patient Active Problem List   Diagnosis Date Noted   Bilateral radiating leg pain 02/12/2023   Mood disorder 02/12/2023   Essential hypertension 07/28/2021   Family history of sickle cell anemia 07/28/2021   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 The tape did not make a big difference.   PERTINENT HISTORY:  2021 lateral ankle fracture - missed step and step down  PAIN:  Are you having pain? Yes: NPRS scale: 6/10 Pain location: R posterior heel and up the calf Pain description: sharp, achey, numb, throbbing Aggravating factors: standing, cooking, cleaning  Relieving factors: heating,  icing, resting  PRECAUTIONS: None  WEIGHT BEARING RESTRICTIONS: No  FALLS:  Has patient fallen in last 6 months? Yes. 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 Minimal bulging and desiccation of the L5-S1 disc. No stenosis or neural compression. The other levels are normal  PATIENT SURVEYS:  FOTO 38 63 @ DC 19 pts MCII   TODAY'S TREATMENT:        4/18  R TCJ posterior glide grade IV STM fibularis group and gastroc soleus  PPT 10x 2s PPT with bridge and GTB at knees 2x10 Supine piriformis stretch 30s 3x HR off step 3s 2x10  DATE: 03/05/23   -PROM R ankle -Talo crural and subtalar mobilizations grade II-III -STM to peroneals and gastroc -Taping: KT tape for gastroc inhibition, leukotape for neutral talar position -Gait training after tape application- hall x2 -Tandem balance on airex pad 30sec x2 R behind -Seated ankle D/F/PF and in/ev with balance board x20ea   DATE: 03/03/23    Exercises - Gastroc Stretch on Wall  - 2 x daily - 7 x weekly - 1 sets - 3 reps - 30 hold - Single Leg Stance  - 1 x daily - 7 x weekly - 1 sets - 3 reps - 30 hold - Heel Raises with Counter Support  - 1 x daily - 7 x weekly - 3 sets - 10 reps   PATIENT EDUCATION:  Education details: MOI, diagnosis, prognosis, anatomy, exercise progression, DOMS expectations, muscle firing,  envelope of function, HEP, POC  Person  educated: Patient Education method: Explanation, Demonstration, Tactile cues, Verbal cues, and Handouts Education comprehension: verbalized understanding, returned demonstration, verbal cues required, and tactile cues required  HOME EXERCISE PROGRAM:  Access Code: [email or web address] (electronic) URL: [email or web address] Date: 03/03/2023 Prepared by: Zebedee Iba  ASSESSMENT:  CLINICAL IMPRESSION: Pt with report of decrease in stiffness of the ankle following manual therapy. Pt had abolishment of ankle pain at end of session. Pt only reports LE fatigue. Pt also given hip strengthening exercise and stretching at this time as hip and ankle are likely effecting her functional mobility.  Pt able to progress ankle mobility and strength exercise today without discomfort. HEP updated accordingly. Plan to increase ankle mobility, soft tissue flexibility, and R ankle strength as tolerated. Consider addition of hip strengthening and mobility as well. Pt would benefit from continued skilled therapy in order to reach goals and maximize functional R LE strength and ROM for full return to PLOF.   OBJECTIVE IMPAIRMENTS: Abnormal gait, decreased activity tolerance, decreased balance, decreased endurance, decreased mobility, difficulty walking, decreased ROM, decreased strength, hypomobility, increased muscle spasms, impaired flexibility, improper body mechanics, and pain.   ACTIVITY LIMITATIONS: carrying, lifting, standing, squatting, stairs, transfers, and locomotion level  PARTICIPATION LIMITATIONS: meal prep, cleaning, laundry, driving, shopping, community activity, occupation, yard work, and exercise  PERSONAL FACTORS: Fitness, Time since onset of injury/illness/exacerbation, and 1-2 comorbidities:    are also affecting patient's functional outcome.   REHAB POTENTIAL: Good  CLINICAL DECISION MAKING: Stable/uncomplicated  EVALUATION COMPLEXITY: Low   GOALS:  SHORT TERM GOALS: Target date:  04/14/2023  Pt will become independent with HEP in order to demonstrate synthesis of PT education. Baseline: Goal status: INITIAL  2.  Pt will be able to demonstrate ability to stand and walking on level surfaces without pain in order to demonstrate functional improvement in LE function for self-care and house hold duties.  Baseline:  Goal status: INITIAL  3.  Pt will score at least 19 pt increase on FOTO to demonstrate functional improvement in MCII and pt perceived function.   Baseline:  Goal status: INITIAL   LONG TERM GOALS: Target date: 05/26/2023   Pt  will become independent with final HEP in order to demonstrate synthesis of PT education. Baseline:  Goal status: INITIAL  2.  Pt will score >/= 63 on FOTO to demonstrate improvement in perceived R LE function.  Baseline:  Goal status: INITIAL  3.  Pt will be able to demonstrate/report ability to walk >30 mins without pain in order to demonstrate functional improvement and tolerance to exercise and community mobility.  Baseline:  Goal status:  INITIAL  4. Pt will be able to demonstrate ability to perform SLS >30s in order to demonstrate functional improvement in LE function for reduction of falls during occupational tasks.  Baseline:  Goal status: INITIAL     PLAN:  PT FREQUENCY: 1-2x/week  PT DURATION: 12 weeks  PLANNED INTERVENTIONS: Therapeutic exercises, Therapeutic activity, Neuromuscular re-education, Balance training, Gait training, Patient/Family education, Self Care, Joint mobilization, Joint manipulation, Stair training, Orthotic/Fit training, Aquatic Therapy, Dry Needling, Electrical stimulation, Spinal manipulation, Spinal mobilization, Cryotherapy, Moist heat, scar mobilization, Splintting, Taping, Vasopneumatic device, Traction, Ultrasound, Ionotophoresis /ml Dexamethasone, Manual therapy, and Re-evaluation  PLAN FOR NEXT SESSION: ankle TCJ joint mobilizations, stair step heel raise, airex  balance/stability, banded sidesteps, step up  Zebedee Iba PT, DPT 03/13/23 10:57 AM

## 2023-03-17 ENCOUNTER — Ambulatory Visit (HOSPITAL_BASED_OUTPATIENT_CLINIC_OR_DEPARTMENT_OTHER): Payer: 59

## 2023-03-20 ENCOUNTER — Ambulatory Visit (HOSPITAL_BASED_OUTPATIENT_CLINIC_OR_DEPARTMENT_OTHER): Payer: 59

## 2023-03-20 ENCOUNTER — Encounter (HOSPITAL_BASED_OUTPATIENT_CLINIC_OR_DEPARTMENT_OTHER): Payer: Self-pay

## 2023-03-20 DIAGNOSIS — M6281 Muscle weakness (generalized): Secondary | ICD-10-CM | POA: Diagnosis not present

## 2023-03-20 DIAGNOSIS — R262 Difficulty in walking, not elsewhere classified: Secondary | ICD-10-CM | POA: Diagnosis not present

## 2023-03-20 DIAGNOSIS — M25571 Pain in right ankle and joints of right foot: Secondary | ICD-10-CM

## 2023-03-20 NOTE — Therapy (Signed)
OUTPATIENT PHYSICAL THERAPY LOWER EXTREMITY TREATMENT   Patient Name: Ann Taylor MRN: [id number] DOB:11-Apr-1981, 42 y.o., female Today's Date: 03/20/2023  END OF SESSION:  PT End of Session - 03/20/23 1121     Visit Number 4    Number of Visits 12    Date for PT Re-Evaluation 06/01/23    Authorization Type Aet[employer]    PT Start Time 1018    PT Stop Time 1100    PT Time Calculation (min) 42 min    Activity Tolerance Patient tolerated treatment well    Behavior During Therapy [facility] for tasks assessed/performed             Past Medical History:  Diagnosis Date   Arrhythmia    Headache(784.0)    Heart murmur    UTI (lower urinary tract infection)    Past Surgical History:  Procedure Laterality Date   CESAREAN SECTION     Patient Active Problem List   Diagnosis Date Noted   Bilateral radiating leg pain 02/12/2023   Mood disorder 02/12/2023   Essential hypertension 07/28/2021   Family history of sickle cell anemia 07/28/2021   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   Neuropathy 07/28/2021   Other allergy, initial encounter 07/28/2021   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 "I can't lay on that side, it hurts so bad." "This pain is worse than my ankle pain."  PERTINENT HISTORY:  2021 lateral ankle fracture - missed step and step down  PAIN:  Are you having pain? Yes: NPRS scale: 6/10 Pain location: R posterior heel and up the calf Pain description: sharp, achey, numb, throbbing Aggravating  factors: standing, cooking, cleaning  Relieving factors: heating, icing, resting  PRECAUTIONS: None  WEIGHT BEARING RESTRICTIONS: No  FALLS:  Has patient fallen in last 6 months? Yes. 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 Minimal bulging and desiccation of the L5-S1 disc. No stenosis or neural compression. The other levels are normal  PATIENT SURVEYS:  FOTO 38 63 @ DC 19 pts MCII   TODAY'S TREATMENT:        4/25  R TCJ posterior glide grade III STM fibularis group and gastroc soleus PROM R hip  PPT 10x 5s HR off step 3s 2x10 -Seated ankle D/F/PF and in/ev with balance board x20ea -Tandem balance on airex pad 30sec x2 R behind  4/18  R TCJ posterior glide grade IV STM fibularis group and gastroc soleus  PPT 10x 2s PPT with bridge and GTB at knees 2x10 Supine piriformis stretch 30s 3x HR off step 3s 2x10                                                                                                                          DATE: 03/05/23   -PROM R ankle -Talo crural and subtalar mobilizations grade II-III -STM to peroneals and gastroc -Taping: KT tape for gastroc inhibition, leukotape for neutral talar position -Gait training after tape application- hall x2 -Tandem balance on airex pad 30sec x2 R behind -Seated ankle D/F/PF and in/ev with balance board x20ea   DATE: 03/03/23    Exercises - Gastroc Stretch on Wall  - 2 x daily - 7 x weekly - 1 sets - 3 reps -  30 hold - Single Leg Stance  - 1 x daily - 7 x weekly - 1 sets - 3 reps - 30 hold - Heel Raises with Counter Support  - 1 x daily - 7 x weekly - 3 sets - 10 reps   PATIENT EDUCATION:  Education details: MOI, diagnosis, prognosis, anatomy, exercise progression, DOMS expectations, muscle firing,  envelope of function, HEP, POC  Person educated: Patient Education method: Explanation, Demonstration, Tactile cues, Verbal cues, and Handouts Education comprehension: verbalized understanding, returned demonstration, verbal cues required, and tactile cues required  HOME EXERCISE PROGRAM:  Access Code: [email or web address] (electronic) URL: [email or web address] Date: 03/03/2023 Prepared by: Zebedee Iba  ASSESSMENT:  CLINICAL IMPRESSION: Pt with increased hip pain with transfers today. Held hip strengthening today due to high pain level. Good tolerance for ankle ROM and strengthening. Mild "pulling" discomfort in lateral ankle reported with inversion on balance board. Fatigues with heel raises, but denies pain. Recommended pt schedule appt with ortho MD for LBP and hip pain. She is starting a new job soon and reports this will affect her availability for attending PT sessions.    OBJECTIVE IMPAIRMENTS: Abnormal gait, decreased activity tolerance, decreased balance, decreased endurance, decreased mobility, difficulty walking, decreased ROM, decreased strength, hypomobility, increased muscle spasms, impaired flexibility, improper body mechanics, and pain.   ACTIVITY LIMITATIONS: carrying, lifting, standing, squatting, stairs, transfers, and locomotion level  PARTICIPATION LIMITATIONS: meal prep, cleaning, laundry, driving, shopping, community activity, occupation, yard work, and exercise  PERSONAL FACTORS: Fitness, Time since onset of injury/illness/exacerbation, and 1-2 comorbidities:    are also affecting patient's functional outcome.   REHAB POTENTIAL: Good  CLINICAL DECISION MAKING:  Stable/uncomplicated  EVALUATION COMPLEXITY: Low   GOALS:  SHORT TERM GOALS: Target date: 04/14/2023  Pt will become independent with HEP in order to demonstrate synthesis of PT education. Baseline: Goal status: INITIAL  2.  Pt will be able to demonstrate ability to stand and walking on level surfaces without pain in order to demonstrate functional improvement in LE function for self-care and house hold duties.  Baseline:  Goal status: INITIAL  3.  Pt will score at least 19 pt increase on FOTO to demonstrate functional improvement in MCII and pt perceived function.   Baseline:  Goal status: INITIAL   LONG TERM GOALS: Target date: 05/26/2023   Pt  will become independent with final HEP in order to demonstrate synthesis of PT education. Baseline:  Goal status: INITIAL  2.  Pt will score >/= 63 on FOTO to demonstrate improvement in perceived R LE function.  Baseline:  Goal status: INITIAL  3.  Pt will be able to demonstrate/report ability to walk >30 mins without pain in order to demonstrate functional improvement and tolerance to exercise and community mobility.  Baseline:  Goal status: INITIAL  4. Pt will be able to demonstrate ability to perform SLS >30s in order to demonstrate functional improvement in LE function for reduction of falls during occupational tasks.  Baseline:  Goal status: INITIAL     PLAN:  PT FREQUENCY: 1-2x/week  PT DURATION: 12 weeks  PLANNED INTERVENTIONS: Therapeutic exercises, Therapeutic activity, Neuromuscular re-education, Balance training, Gait training, Patient/Family education, Self Care, Joint mobilization, Joint manipulation, Stair training, Orthotic/Fit training, Aquatic Therapy, Dry Needling, Electrical stimulation, Spinal manipulation, Spinal mobilization, Cryotherapy, Moist heat, scar mobilization, Splintting, Taping, Vasopneumatic device, Traction, Ultrasound, Ionotophoresis /ml Dexamethasone, Manual therapy, and  Re-evaluation  PLAN FOR NEXT SESSION: ankle TCJ joint mobilizations, stair step heel raise, airex balance/stability, banded sidesteps, step up  [employer], PTA  03/20/23 11:27 AM

## 2023-03-26 ENCOUNTER — Ambulatory Visit (HOSPITAL_BASED_OUTPATIENT_CLINIC_OR_DEPARTMENT_OTHER): Payer: 59

## 2023-03-27 ENCOUNTER — Other Ambulatory Visit (HOSPITAL_COMMUNITY)
Admission: RE | Admit: 2023-03-27 | Discharge: 2023-03-27 | Disposition: A | Discharge status: Home / Self Care | Payer: 59 | Source: Ambulatory Visit | Attending: Obstetrics and Gynecology | Admitting: Obstetrics and Gynecology

## 2023-03-27 ENCOUNTER — Encounter: Payer: Self-pay | Admitting: Obstetrics and Gynecology

## 2023-03-27 ENCOUNTER — Ambulatory Visit (INDEPENDENT_AMBULATORY_CARE_PROVIDER_SITE_OTHER): Payer: 59 | Admitting: Obstetrics and Gynecology

## 2023-03-27 VITALS — BP 120/80 | HR 72 | Ht 65.0 in | Wt 186.0 lb

## 2023-03-27 DIAGNOSIS — Z01419 Encounter for gynecological examination (general) (routine) without abnormal findings: Secondary | ICD-10-CM | POA: Insufficient documentation

## 2023-03-27 DIAGNOSIS — Z1339 Encounter for screening examination for other mental health and behavioral disorders: Secondary | ICD-10-CM

## 2023-03-27 DIAGNOSIS — Z3046 Encounter for surveillance of implantable subdermal contraceptive: Secondary | ICD-10-CM

## 2023-03-27 DIAGNOSIS — Z975 Presence of (intrauterine) contraceptive device: Secondary | ICD-10-CM

## 2023-03-27 DIAGNOSIS — Z202 Contact with and (suspected) exposure to infections with a predominantly sexual mode of transmission: Secondary | ICD-10-CM

## 2023-03-27 NOTE — Progress Notes (Signed)
Ann Taylor is a 42 y.o. G23P2002 female here for a routine annual gynecologic exam.  Current complaints: Desires STD testing.   Denies abnormal vaginal bleeding, discharge, pelvic pain, problems with intercourse or other gynecologic concerns.    Gynecologic History No LMP recorded (lmp unknown). Patient has had an implant. Contraception: Nexplanon Last Pap: 8/22. Results were: normal Last mammogram: NA  Obstetric History OB History  Gravida Para Term Preterm AB Living  3 2 2  0 0 2  SAB IAB Ectopic Multiple Live Births  0 0 0 0      # Outcome Date GA Lbr Len/2nd Weight Sex Delivery Anes PTL Lv  3 Gravida              Birth Comments: System Generated. Please review and update pregnancy details.  2 Term           1 Term             Past Medical History:  Diagnosis Date   Arrhythmia    Headache(784.0)    Heart murmur    UTI (lower urinary tract infection)     Past Surgical History:  Procedure Laterality Date   CESAREAN SECTION      Current Outpatient Medications on File Prior to Visit  Medication Sig Dispense Refill   gabapentin (NEURONTIN) 300 MG capsule Take 300 mg by mouth 2 (two) times daily.     triamterene-hydrochlorothiazide (DYAZIDE) 37.5-25 MG capsule Take 1 each (1 capsule total) by mouth daily. 15 capsule 0   No current facility-administered medications on file prior to visit.    Allergies  Allergen Reactions   Oxycodone Hives and Itching   Shellfish Allergy Anaphylaxis   Shellfish-Derived Products Anaphylaxis   Codone [Hydrocodone] Itching   Codeine Hives, Itching and Rash   Tramadol Hives, Itching and Anxiety    Social History   Socioeconomic History   Marital status: Single    Spouse name: Not on file   Number of children: Not on file   Years of education: Not on file   Highest education level: Not on file  Occupational History   Not on file  Tobacco Use   Smoking status: Never   Smokeless tobacco: Never  Vaping Use   Vaping Use:  Never used  Substance and Sexual Activity   Alcohol use: No   Drug use: No   Sexual activity: Yes    Birth control/protection: None  Other Topics Concern   Not on file  Social History Narrative   Not on file   Social Determinants of Health   Financial Resource Strain: Not on file  Food Insecurity: Not on file  Transportation Needs: Not on file  Physical Activity: Not on file  Stress: Not on file  Social Connections: Not on file  Intimate Partner Violence: Not on file    Family History  Problem Relation Age of Onset   Alcohol abuse Neg Hx    Arthritis Neg Hx    Asthma Neg Hx    Birth defects Neg Hx    Cancer Neg Hx    COPD Neg Hx    Depression Neg Hx    Diabetes Neg Hx    Drug abuse Neg Hx    Early death Neg Hx    Hearing loss Neg Hx    Heart disease Neg Hx    Hyperlipidemia Neg Hx    Hypertension Neg Hx    Kidney disease Neg Hx    Learning disabilities Neg Hx  Mental illness Neg Hx    Mental retardation Neg Hx    Miscarriages / Stillbirths Neg Hx    Stroke Neg Hx    Vision loss Neg Hx     The following portions of the patient's history were reviewed and updated as appropriate: allergies, current medications, past family history, past medical history, past social history, past surgical history and problem list.  Review of Systems Pertinent items noted in HPI and remainder of comprehensive ROS otherwise negative.   Objective:  BP 120/80   Pulse 72   Ht 5\' 5"  (1.651 m)   Wt 186 lb (84.4 kg)   LMP  (LMP Unknown)   BMI 30.95 kg/m Chaperone present CONSTITUTIONAL: Well-developed, well-nourished female in no acute distress.  HENT:  Normocephalic, atraumatic, External right and left ear normal. Oropharynx is clear and moist EYES: Conjunctivae and EOM are normal. Pupils are equal, round, and reactive to light. No scleral icterus.  NECK: Normal range of motion, supple, no masses.  Normal thyroid.  SKIN: Skin is warm and dry. No rash noted. Not diaphoretic. No  erythema. No pallor. NEUROLGIC: Alert and oriented to person, place, and time. Normal reflexes, muscle tone coordination. No cranial nerve deficit noted. PSYCHIATRIC: Normal mood and affect. Normal behavior. Normal judgment and thought content. CARDIOVASCULAR: Normal heart rate noted, regular rhythm RESPIRATORY: Clear to auscultation bilaterally. Effort and breath sounds normal, no problems with respiration noted. BREASTS: Declined ABDOMEN: Soft, normal bowel sounds, no distention noted.  No tenderness, rebound or guarding.  PELVIC: Normal appearing external genitalia; normal appearing vaginal mucosa and cervix.  No abnormal discharge noted.  Pap smear obtained.  Normal uterine size, no other palpable masses, no uterine or adnexal tenderness. MUSCULOSKELETAL: Normal range of motion. No tenderness.  No cyanosis, clubbing, or edema.  2+ distal pulses.   Assessment:  Annual gynecologic examination with pap smear STD Exposure Plan:  Will follow up results of pap smear and manage accordingly. Mammogram scheduled Check UC STD testing as per pt request Routine preventative health maintenance measures emphasized. Please refer to After Visit Summary for other counseling recommendations.    Hermina Staggers, MD, FACOG Attending Obstetrician & Gynecologist Center for [facility], [facility]

## 2023-03-27 NOTE — Progress Notes (Addendum)
42 y.o GYN presents for AEX/PAP/STD screening.  C/o vaginal itching, frequent UTI.  Pt wants her PAP done today.

## 2023-03-28 ENCOUNTER — Encounter: Payer: Self-pay | Admitting: Obstetrics and Gynecology

## 2023-03-28 ENCOUNTER — Other Ambulatory Visit: Payer: Self-pay | Admitting: Obstetrics and Gynecology

## 2023-03-28 LAB — CERVICOVAGINAL ANCILLARY ONLY
Bacterial Vaginitis (gardnerella): POSITIVE — AB
Candida Glabrata: NEGATIVE
Candida Vaginitis: NEGATIVE
Chlamydia: NEGATIVE
Comment: NEGATIVE
Comment: NEGATIVE
Comment: NEGATIVE
Comment: NEGATIVE
Comment: NEGATIVE
Comment: NORMAL
Neisseria Gonorrhea: NEGATIVE
Trichomonas: NEGATIVE

## 2023-03-28 LAB — HIV ANTIBODY (ROUTINE TESTING W REFLEX): HIV Screen 4th Generation wRfx: NONREACTIVE

## 2023-03-28 LAB — RPR: RPR Ser Ql: NONREACTIVE

## 2023-03-29 LAB — URINE CULTURE

## 2023-03-31 ENCOUNTER — Other Ambulatory Visit: Payer: Self-pay | Admitting: Obstetrics and Gynecology

## 2023-03-31 ENCOUNTER — Other Ambulatory Visit: Payer: Self-pay

## 2023-03-31 MED ORDER — METRONIDAZOLE 500 MG PO TABS: 500.0000 mg | ORAL_TABLET | Freq: Two times a day (BID) | ORAL | 0 refills | Status: DC

## 2023-04-01 ENCOUNTER — Ambulatory Visit (HOSPITAL_BASED_OUTPATIENT_CLINIC_OR_DEPARTMENT_OTHER): Payer: 59 | Attending: Orthopaedic Surgery | Admitting: Physical Therapy

## 2023-04-01 DIAGNOSIS — M6281 Muscle weakness (generalized): Secondary | ICD-10-CM | POA: Insufficient documentation

## 2023-04-01 DIAGNOSIS — R262 Difficulty in walking, not elsewhere classified: Secondary | ICD-10-CM | POA: Insufficient documentation

## 2023-04-01 DIAGNOSIS — M25571 Pain in right ankle and joints of right foot: Secondary | ICD-10-CM | POA: Insufficient documentation

## 2023-04-01 LAB — CYTOLOGY - PAP
Comment: NEGATIVE
Diagnosis: NEGATIVE
High risk HPV: NEGATIVE

## 2023-04-02 ENCOUNTER — Emergency Department (HOSPITAL_COMMUNITY)
Admission: EM | Admit: 2023-04-02 | Discharge: 2023-04-02 | Disposition: A | Discharge status: Home / Self Care | Payer: 59 | Attending: Emergency Medicine | Admitting: Emergency Medicine

## 2023-04-02 ENCOUNTER — Encounter (HOSPITAL_COMMUNITY): Payer: Self-pay

## 2023-04-02 ENCOUNTER — Other Ambulatory Visit: Payer: Self-pay

## 2023-04-02 DIAGNOSIS — M79604 Pain in right leg: Secondary | ICD-10-CM | POA: Diagnosis not present

## 2023-04-02 MED ORDER — KETOROLAC TROMETHAMINE 30 MG/ML IJ SOLN
30.0000 mg | Freq: Once | INTRAMUSCULAR | Status: AC
  Administered 2023-04-02: 30 mg via INTRAMUSCULAR
  Filled 2023-04-02: qty 1

## 2023-04-02 MED ORDER — PREDNISONE 20 MG PO TABS: 20.0000 mg | ORAL_TABLET | Freq: Every day | ORAL | 0 refills | Status: DC

## 2023-04-02 MED ORDER — METHOCARBAMOL 500 MG PO TABS: 500.0000 mg | ORAL_TABLET | Freq: Two times a day (BID) | ORAL | 0 refills | Status: DC

## 2023-04-02 MED ORDER — LIDOCAINE 5 % EX PTCH: 1.0000 | MEDICATED_PATCH | CUTANEOUS | 0 refills | Status: DC

## 2023-04-02 NOTE — Discharge Instructions (Addendum)
It was a pleasure taking care of you in the Ed today  We have written you for a few medications.  Take as prescribed.  Follow-up with primary care provider or orthopedics  For new or worsening symptoms

## 2023-04-02 NOTE — ED Triage Notes (Signed)
Pt states she is having right leg pain & left lower back pain x 4 months.  States she has been seen for this same pain & got a shot in her joint to help with the pain but it hasn't helped.  Now her lower back is hurting.  States she can't get any sleep d/t the pain.

## 2023-04-02 NOTE — ED Provider Notes (Signed)
[place] [facility] AT [facility] Provider Note   CSN: [id number] Arrival date & time: 04/02/23  2118    History  Chief Complaint  Patient presents with   Leg Pain    Ann Taylor is a 42 y.o. female with past medical history significant for chronic pain in lower extremity here for evaluation of back pain.  States she has some chronic foot pain which she was seen by podiatry and then orthopedics.  She is been doing PT for this.  She has worked the last 10 days does a lot of standing, twisting and bending.  She has pain that starts in her posterior right lower back/gluteus and goes down her leg.  No weakness, bowel or bladder incontinence, saddle paresthesia.  Hurts with movement, improves with rest, hurts to lay on her side.  No redness or warmth.  Recent falls or injuries.  No meds PTA.  Has not had a day off in over 10 days which she contributes to worsening of her pain due to overuse.  HPI     Home Medications Prior to Admission medications   Medication Sig Start Date End Date Taking? Authorizing Provider  lidocaine (LIDODERM) 5 % Place 1 patch onto the skin daily. Remove & Discard patch within 12 hours or as directed by MD 04/02/23  Yes Wyn Nettle A, PA-C  methocarbamol (ROBAXIN) 500 MG tablet Take 1 tablet (500 mg total) by mouth 2 (two) times daily. 04/02/23  Yes Kelvis Berger A, PA-C  predniSONE (DELTASONE) 20 MG tablet Take 1 tablet (20 mg total) by mouth daily. 04/02/23  Yes Keiasia Christianson A, PA-C  gabapentin (NEURONTIN) 300 MG capsule Take 300 mg by mouth 2 (two) times daily.    [provider]  metroNIDAZOLE (FLAGYL) 500 MG tablet Take 1 tablet (500 mg total) by mouth 2 (two) times daily. 03/31/23   Hermina Staggers, MD  triamterene-hydrochlorothiazide (DYAZIDE) 37.5-25 MG capsule Take 1 each (1 capsule total) by mouth daily. 09/24/22   Constant, Peggy, MD      Allergies    Oxycodone, Shellfish allergy, Shellfish-derived products,  Codone [hydrocodone], Codeine, and Tramadol    Review of Systems   Review of Systems  Constitutional: Negative.   HENT: Negative.    Respiratory: Negative.    Cardiovascular: Negative.   Gastrointestinal: Negative.   Genitourinary: Negative.   Musculoskeletal:  Positive for back pain.  Skin: Negative.   Neurological: Negative.   All other systems reviewed and are negative.   Physical Exam Updated Vital Signs BP (!) 147/86   Pulse (!) 119   Temp 98.7 F (37.1 C) (Oral)   Resp 18   Ht 5\' 5"  (1.651 m)   Wt 84.8 kg   LMP  (LMP Unknown)   SpO2 97%   BMI 31.12 kg/m  Physical Exam Vitals and nursing note reviewed.  Constitutional:      General: She is not in acute distress.    Appearance: She is well-developed. She is not ill-appearing, toxic-appearing or diaphoretic.  HENT:     Head: Atraumatic.  Eyes:     Pupils: Pupils are equal, round, and reactive to light.  Cardiovascular:     Rate and Rhythm: Normal rate.     Pulses: Normal pulses.          Dorsalis pedis pulses are 2+ on the right side and 2+ on the left side.       Posterior tibial pulses are 2+ on the right side and 2+  on the left side.     Heart sounds: Normal heart sounds.  Pulmonary:     Effort: Pulmonary effort is normal. No respiratory distress.     Breath sounds: Normal breath sounds.  Abdominal:     General: Bowel sounds are normal. There is no distension.  Musculoskeletal:        General: Tenderness present. No swelling, deformity or signs of injury. Normal range of motion.     Cervical back: Normal range of motion.       Back:     Right lower leg: Normal. No edema.     Left lower leg: Normal. No edema.     Comments: Tenderness right posterior gluteal region.  No redness, warmth, rashes or lesions.  Worse with movement.  Intact sensation.  Equal strength bilaterally.  Ambulatory. Compartments soft  Skin:    General: Skin is warm and dry.     Capillary Refill: Capillary refill takes less than 2  seconds.     Comments: No edema, erythema or warmth.  No rashes or lesions  Neurological:     General: No focal deficit present.     Mental Status: She is alert.  Psychiatric:        Mood and Affect: Mood normal.     ED Results / Procedures / Treatments   Labs (all labs ordered are listed, but only abnormal results are displayed) Labs Reviewed - No data to display  EKG None  Radiology No results found.  Procedures Procedures    Medications Ordered in ED Medications  ketorolac (TORADOL) 30 MG/ML injection 30 mg (30 mg Intramuscular Given 04/02/23 2223)   ED Course/ Medical Decision Making/ A&P    41 year old here for evaluation of back pain located to right lower back into her right gluteus and thigh.  No recent trauma or injuries.  No history of IVDU, bowel or bladder incontinence, saddle paresthesia.  Relates pain to worsening after working in straight days on her feet with lot of bending and twisting.  No abdominal pain.  She is no clinical evidence of VTE.  No chest pain, shortness of breath.  She is ambulatory here.  Pain is reproducible.  Negative SLR.  She has no overlying skin changes to suggest septic joint, gout, hemarthrosis.  No rash to suggest zoster.  Pain is reproducible on exam.  Suspect overuse injury.  Do not think she needs lower back imaging or hip imaging at this time.  She already has follow-up with orthopedics who is requesting referral to new orthopedist as she will be employed by her previous 1.  Numbers given.  She will need to call to schedule appointment.  She will return for new or worsening symptoms.  Put in for acute neurosurgical or orthopedic emergency such as cauda equina, discitis, osteomas, transverse myelitis, septic joint, VTE, PE, incarcerated hernia, AAA, dissection, claudication, arterial occlusion  The patient has been appropriately medically screened and/or stabilized in the ED. I have low suspicion for any other emergent medical condition  which would require further screening, evaluation or treatment in the ED or require inpatient management.  Patient is hemodynamically stable and in no acute distress.  Patient able to ambulate in department prior to ED.  Evaluation does not show acute pathology that would require ongoing or additional emergent interventions while in the emergency department or further inpatient treatment.  I have discussed the diagnosis with the patient and answered all questions.  Pain is been managed while in the emergency department and  patient has no further complaints prior to discharge.  Patient is comfortable with plan discussed in room and is stable for discharge at this time.  I have discussed strict return precautions for returning to the emergency department.  Patient was encouraged to follow-up with PCP/specialist refer to at discharge.                              Medical Decision Making Amount and/or Complexity of Data Reviewed External Data Reviewed: labs, radiology and notes.  Risk OTC drugs. Prescription drug management. Parenteral controlled substances. Decision regarding hospitalization. Diagnosis or treatment significantly limited by social determinants of health.           Final Clinical Impression(s) / ED Diagnoses Final diagnoses:  Right leg pain    Rx / DC Orders ED Discharge Orders          Ordered    predniSONE (DELTASONE) 20 MG tablet  Daily        04/02/23 2245    methocarbamol (ROBAXIN) 500 MG tablet  2 times daily        04/02/23 2245    lidocaine (LIDODERM) 5 %  Every 24 hours        04/02/23 2245              Tijah Hane A, PA-C 04/02/23 2249    Melene Plan, DO 04/02/23 2250

## 2023-04-03 ENCOUNTER — Ambulatory Visit (HOSPITAL_BASED_OUTPATIENT_CLINIC_OR_DEPARTMENT_OTHER)
Admission: RE | Admit: 2023-04-03 | Discharge: 2023-04-03 | Disposition: A | Discharge status: Home / Self Care | Payer: 59 | Source: Ambulatory Visit | Attending: Obstetrics and Gynecology | Admitting: Obstetrics and Gynecology

## 2023-04-03 ENCOUNTER — Encounter (HOSPITAL_BASED_OUTPATIENT_CLINIC_OR_DEPARTMENT_OTHER): Payer: Self-pay | Admitting: Radiology

## 2023-04-03 DIAGNOSIS — Z01419 Encounter for gynecological examination (general) (routine) without abnormal findings: Secondary | ICD-10-CM | POA: Insufficient documentation

## 2023-04-03 DIAGNOSIS — Z1231 Encounter for screening mammogram for malignant neoplasm of breast: Secondary | ICD-10-CM | POA: Diagnosis not present

## 2023-04-04 ENCOUNTER — Encounter (HOSPITAL_BASED_OUTPATIENT_CLINIC_OR_DEPARTMENT_OTHER): Payer: 59 | Admitting: Physical Therapy

## 2023-04-07 ENCOUNTER — Other Ambulatory Visit: Payer: Self-pay | Admitting: Obstetrics and Gynecology

## 2023-04-07 DIAGNOSIS — R928 Other abnormal and inconclusive findings on diagnostic imaging of breast: Secondary | ICD-10-CM

## 2023-04-09 DIAGNOSIS — M25551 Pain in right hip: Secondary | ICD-10-CM | POA: Diagnosis not present

## 2023-04-09 DIAGNOSIS — M25851 Other specified joint disorders, right hip: Secondary | ICD-10-CM | POA: Diagnosis not present

## 2023-04-15 ENCOUNTER — Ambulatory Visit
Admission: RE | Admit: 2023-04-15 | Discharge: 2023-04-15 | Disposition: A | Discharge status: Home / Self Care | Payer: 59 | Source: Ambulatory Visit | Attending: Obstetrics and Gynecology | Admitting: Obstetrics and Gynecology

## 2023-04-15 ENCOUNTER — Other Ambulatory Visit: Payer: Self-pay | Admitting: Obstetrics and Gynecology

## 2023-04-15 DIAGNOSIS — R92332 Mammographic heterogeneous density, left breast: Secondary | ICD-10-CM | POA: Diagnosis not present

## 2023-04-15 DIAGNOSIS — R922 Inconclusive mammogram: Secondary | ICD-10-CM | POA: Diagnosis not present

## 2023-04-15 DIAGNOSIS — R928 Other abnormal and inconclusive findings on diagnostic imaging of breast: Secondary | ICD-10-CM

## 2023-04-15 DIAGNOSIS — R921 Mammographic calcification found on diagnostic imaging of breast: Secondary | ICD-10-CM | POA: Diagnosis not present

## 2023-04-17 ENCOUNTER — Ambulatory Visit
Admission: RE | Admit: 2023-04-17 | Discharge: 2023-04-17 | Disposition: A | Discharge status: Home / Self Care | Payer: 59 | Source: Ambulatory Visit | Attending: Obstetrics and Gynecology | Admitting: Obstetrics and Gynecology

## 2023-04-17 ENCOUNTER — Other Ambulatory Visit: Payer: Self-pay | Admitting: General Practice

## 2023-04-17 DIAGNOSIS — R921 Mammographic calcification found on diagnostic imaging of breast: Secondary | ICD-10-CM | POA: Diagnosis not present

## 2023-04-17 DIAGNOSIS — N6002 Solitary cyst of left breast: Secondary | ICD-10-CM | POA: Diagnosis not present

## 2023-04-17 HISTORY — PX: BREAST BIOPSY: SHX20

## 2023-04-22 DIAGNOSIS — M5441 Lumbago with sciatica, right side: Secondary | ICD-10-CM | POA: Diagnosis not present

## 2023-04-22 DIAGNOSIS — M549 Dorsalgia, unspecified: Secondary | ICD-10-CM | POA: Diagnosis not present

## 2023-04-22 DIAGNOSIS — M25551 Pain in right hip: Secondary | ICD-10-CM | POA: Diagnosis not present

## 2023-04-25 DIAGNOSIS — M79604 Pain in right leg: Secondary | ICD-10-CM | POA: Diagnosis not present

## 2023-04-25 DIAGNOSIS — G8929 Other chronic pain: Secondary | ICD-10-CM | POA: Diagnosis not present

## 2023-04-25 DIAGNOSIS — M25551 Pain in right hip: Secondary | ICD-10-CM | POA: Diagnosis not present

## 2023-04-25 DIAGNOSIS — M792 Neuralgia and neuritis, unspecified: Secondary | ICD-10-CM | POA: Diagnosis not present

## 2023-05-14 ENCOUNTER — Ambulatory Visit (INDEPENDENT_AMBULATORY_CARE_PROVIDER_SITE_OTHER): Payer: 59

## 2023-05-14 ENCOUNTER — Ambulatory Visit (INDEPENDENT_AMBULATORY_CARE_PROVIDER_SITE_OTHER): Payer: 59 | Admitting: Orthopaedic Surgery

## 2023-05-14 ENCOUNTER — Ambulatory Visit (HOSPITAL_BASED_OUTPATIENT_CLINIC_OR_DEPARTMENT_OTHER): Payer: Self-pay | Admitting: Orthopaedic Surgery

## 2023-05-14 DIAGNOSIS — M25551 Pain in right hip: Secondary | ICD-10-CM

## 2023-05-14 DIAGNOSIS — M25851 Other specified joint disorders, right hip: Secondary | ICD-10-CM | POA: Diagnosis not present

## 2023-05-14 DIAGNOSIS — M949 Disorder of cartilage, unspecified: Secondary | ICD-10-CM

## 2023-05-14 NOTE — Progress Notes (Signed)
Chief Complaint: Right hip pain     History of Present Illness:    Ann Taylor is a 42 y.o. female presents today with ongoing right hip pain which has been bothersome for last year.  She states this has been a chronic condition which has been bothering her specifically in the groin area.  She did recently have an x-ray with her primary care who was told that there was a concern for extra bone.  She has undergone physical therapy although she had to stop this due to persistent pain.  She has previously had injections with little relief.  She does have a history of a lumbar MRI where there was a concern for a disc herniation.  This time she is having a very difficult time laying directly on the side or doing basic activities like sitting for prolonged periods.  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    Surgical History:   None  PMH/PSH/Family History/Social History/Meds/Allergies:    Past Medical History:  Diagnosis Date   Arrhythmia    Headache(784.0)    Heart murmur    UTI (lower urinary tract infection)    Past Surgical History:  Procedure Laterality Date   BREAST BIOPSY Left 04/17/2023   MM LT BREAST BX W LOC DEV 1ST LESION IMAGE BX SPEC STEREO GUIDE 04/17/2023 [facility]   CESAREAN SECTION     Social History   Socioeconomic History   Marital status: Single    Spouse name: Not on file   Number of children: Not on file   Years of education: Not on file   Highest education level: Not on file  Occupational History   Not on file  Tobacco Use   Smoking status: Never   Smokeless tobacco: Never  Vaping Use   Vaping Use: Never used  Substance and Sexual Activity   Alcohol use: No   Drug use: No   Sexual activity: Yes    Birth control/protection: None  Other Topics Concern   Not on file  Social History Narrative   Not on file   Social Determinants of Health   Financial Resource Strain: Not on file  Food  Insecurity: Not on file  Transportation Needs: Not on file  Physical Activity: Not on file  Stress: Not on file  Social Connections: Not on file   Family History  Problem Relation Age of Onset   Alcohol abuse Neg Hx    Arthritis Neg Hx    Asthma Neg Hx    Birth defects Neg Hx    Cancer Neg Hx    COPD Neg Hx    Depression Neg Hx    Diabetes Neg Hx    Drug abuse Neg Hx    Early death Neg Hx    Hearing loss Neg Hx    Heart disease Neg Hx    Hyperlipidemia Neg Hx    Hypertension Neg Hx    Kidney disease Neg Hx    Learning disabilities Neg Hx    Mental illness Neg Hx    Mental retardation Neg Hx    Miscarriages / Stillbirths Neg Hx    Stroke Neg Hx    Vision loss Neg Hx    Allergies  Allergen Reactions   Oxycodone Hives and Itching   Shellfish Allergy Anaphylaxis  Shellfish-Derived Products Anaphylaxis   Codone [Hydrocodone] Itching   Codeine Hives, Itching and Rash   Tramadol Hives, Itching and Anxiety   Current Outpatient Medications  Medication Sig Dispense Refill   gabapentin (NEURONTIN) 300 MG capsule Take 300 mg by mouth 2 (two) times daily.     lidocaine (LIDODERM) 5 % Place 1 patch onto the skin daily. Remove & Discard patch within 12 hours or as directed by MD 30 patch 0   methocarbamol (ROBAXIN) 500 MG tablet Take 1 tablet (500 mg total) by mouth 2 (two) times daily. 20 tablet 0   metroNIDAZOLE (FLAGYL) 500 MG tablet Take 1 tablet (500 mg total) by mouth 2 (two) times daily. 14 tablet 0   predniSONE (DELTASONE) 20 MG tablet Take 1 tablet (20 mg total) by mouth daily. 5 tablet 0   triamterene-hydrochlorothiazide (DYAZIDE) 37.5-25 MG capsule Take 1 each (1 capsule total) by mouth daily. 15 capsule 0   No current facility-administered medications for this visit.   No results found.  Review of Systems:   A ROS was performed including pertinent positives and negatives as documented in the HPI.  Physical Exam :   Constitutional: NAD and appears stated  age Neurological: Alert and oriented Psych: Appropriate affect and cooperative There were no vitals taken for this visit.   Comprehensive Musculoskeletal Exam:    Inspection Right Left  Skin No atrophy or gross abnormalities appreciated No atrophy or gross abnormalities appreciated  Palpation    Tenderness None None  Crepitus None None  Range of Motion    Flexion (passive) 120 120  Extension 30 30  IR 30 with pain 30  ER 45 45  Strength    Flexion  5/5 5/5  Extension 5/5 5/5  Special Tests    FABER Negative Negative  FADIR Positive Negative  ER Lag/Capsular Insufficiency Negative Negative  Instability Negative Negative  Sacroiliac pain Negative  Negative   Instability    Generalized Laxity No No  Neurologic    sciatic, femoral, obturator nerves intact to light sensation  Vascular/Lymphatic    DP pulse 2+ 2+  Lumbar Exam    Patient has symmetric lumbar range of motion with negative pain referral to hip     Imaging:   Xray (4 views right hip): Significant pincer impingement with well-maintained femoral acetabular joint   I personally reviewed and interpreted the radiographs.   Assessment:   42 y.o. female with right hip femoral acetabular impingement which has been significantly bothersome for the last several months.  At today's visit I have recommended that we begin initially with an ultrasound-guided injection of the femoral acetabular joint in order to get her relief.  At this time I would like to hold off on therapy as this is only been aggravating her hip pain.  I do believe that an additional injection would be helpful diagnostically and therapeutically so that we can ascertain if her pincer impingement is the source of her underlying pain  Plan :    -Plan to return to clinic on a Friday for right hip femoral acetabular injection       I personally saw and evaluated the patient, and participated in the management and treatment plan.  Huel Cote,  MD Attending Physician, Orthopedic Surgery  This document was dictated using Dragon voice recognition software. A reasonable attempt at proof reading has been made to minimize errors.

## 2023-05-22 ENCOUNTER — Ambulatory Visit (INDEPENDENT_AMBULATORY_CARE_PROVIDER_SITE_OTHER): Payer: 59 | Admitting: Orthopaedic Surgery

## 2023-05-22 DIAGNOSIS — M25551 Pain in right hip: Secondary | ICD-10-CM

## 2023-05-22 DIAGNOSIS — M25851 Other specified joint disorders, right hip: Secondary | ICD-10-CM

## 2023-05-22 MED ORDER — TRIAMCINOLONE ACETONIDE 40 MG/ML IJ SUSP
80.0000 mg | INTRAMUSCULAR | Status: AC | PRN
Start: 2023-05-22 — End: 2023-05-22
  Administered 2023-05-22: 80 mg via INTRA_ARTICULAR

## 2023-05-22 MED ORDER — LIDOCAINE HCL 1 % IJ SOLN
4.0000 mL | INTRAMUSCULAR | Status: AC | PRN
Start: 2023-05-22 — End: 2023-05-22
  Administered 2023-05-22: 4 mL

## 2023-05-22 NOTE — Progress Notes (Signed)
Chief Complaint: Right hip pain     History of Present Illness:   05/22/2023: Presents today for follow-up of her right hip.  Here today for right hip x-ray and injection  Ann Taylor is a 42 y.o. female presents today with ongoing right hip pain which has been bothersome for last year.  She states this has been a chronic condition which has been bothering her specifically in the groin area.  She did recently have an x-ray with her primary care who was told that there was a concern for extra bone.  She has undergone physical therapy although she had to stop this due to persistent pain.  She has previously had injections with little relief.  She does have a history of a lumbar MRI where there was a concern for a disc herniation.  This time she is having a very difficult time laying directly on the side or doing basic activities like sitting for prolonged periods.  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    Surgical History:   None  PMH/PSH/Family History/Social History/Meds/Allergies:    Past Medical History:  Diagnosis Date   Arrhythmia    Headache(784.0)    Heart murmur    UTI (lower urinary tract infection)    Past Surgical History:  Procedure Laterality Date   BREAST BIOPSY Left 04/17/2023   MM LT BREAST BX W LOC DEV 1ST LESION IMAGE BX SPEC STEREO GUIDE 04/17/2023 [facility]MO[facility]AP[facility]   CESAREAN SECTION     Social History   Socioeconomic History   Marital status: Single    Spouse name: Not on file   Number of children: Not on file   Years of education: Not on file   Highest education level: Not on file  Occupational History   Not on file  Tobacco Use   Smoking status: Never   Smokeless tobacco: Never  Vaping Use   Vaping Use: Never used  Substance and Sexual Activity   Alcohol use: No   Drug use: No   Sexual activity: Yes    Birth control/protection: None  Other Topics Concern   Not on file  Social History Narrative    Not on file   Social Determinants of Health   Financial Resource Strain: Not on file  Food Insecurity: Not on file  Transportation Needs: Not on file  Physical Activity: Not on file  Stress: Not on file  Social Connections: Not on file   Family History  Problem Relation Age of Onset   Alcohol abuse Neg Hx    Arthritis Neg Hx    Asthma Neg Hx    Birth defects Neg Hx    Cancer Neg Hx    COPD Neg Hx    Depression Neg Hx    Diabetes Neg Hx    Drug abuse Neg Hx    Early death Neg Hx    Hearing loss Neg Hx    Heart disease Neg Hx    Hyperlipidemia Neg Hx    Hypertension Neg Hx    Kidney disease Neg Hx    Learning disabilities Neg Hx    Mental illness Neg Hx    Mental retardation Neg Hx    Miscarriages / Stillbirths Neg Hx    Stroke Neg Hx    Vision loss Neg Hx  Allergies  Allergen Reactions   Oxycodone Hives and Itching   Shellfish Allergy Anaphylaxis   Shellfish-Derived Products Anaphylaxis   Codone [Hydrocodone] Itching   Codeine Hives, Itching and Rash   Tramadol Hives, Itching and Anxiety   Current Outpatient Medications  Medication Sig Dispense Refill   gabapentin (NEURONTIN) 300 MG capsule Take 300 mg by mouth 2 (two) times daily.     lidocaine (LIDODERM) 5 % Place 1 patch onto the skin daily. Remove & Discard patch within 12 hours or as directed by MD 30 patch 0   methocarbamol (ROBAXIN) 500 MG tablet Take 1 tablet (500 mg total) by mouth 2 (two) times daily. 20 tablet 0   metroNIDAZOLE (FLAGYL) 500 MG tablet Take 1 tablet (500 mg total) by mouth 2 (two) times daily. 14 tablet 0   predniSONE (DELTASONE) 20 MG tablet Take 1 tablet (20 mg total) by mouth daily. 5 tablet 0   triamterene-hydrochlorothiazide (DYAZIDE) 37.5-25 MG capsule Take 1 each (1 capsule total) by mouth daily. 15 capsule 0   No current facility-administered medications for this visit.   No results found.  Review of Systems:   A ROS was performed including pertinent positives and  negatives as documented in the HPI.  Physical Exam :   Constitutional: NAD and appears stated age Neurological: Alert and oriented Psych: Appropriate affect and cooperative There were no vitals taken for this visit.   Comprehensive Musculoskeletal Exam:    Inspection Right Left  Skin No atrophy or gross abnormalities appreciated No atrophy or gross abnormalities appreciated  Palpation    Tenderness None None  Crepitus None None  Range of Motion    Flexion (passive) 120 120  Extension 30 30  IR 30 with pain 30  ER 45 45  Strength    Flexion  5/5 5/5  Extension 5/5 5/5  Special Tests    FABER Negative Negative  FADIR Positive Negative  ER Lag/Capsular Insufficiency Negative Negative  Instability Negative Negative  Sacroiliac pain Negative  Negative   Instability    Generalized Laxity No No  Neurologic    sciatic, femoral, obturator nerves intact to light sensation  Vascular/Lymphatic    DP pulse 2+ 2+  Lumbar Exam    Patient has symmetric lumbar range of motion with negative pain referral to hip     Imaging:   Xray (4 views right hip): Significant pincer impingement with well-maintained femoral acetabular joint   I personally reviewed and interpreted the radiographs.   Assessment:   42 y.o. female with right hip femoral acetabular impingement which has been significantly bothersome for the last several months.  At today's visit I have recommended that we begin initially with an ultrasound-guided injection of the femoral acetabular joint in order to get her relief.  At this time I would like to hold off on therapy as this is only been aggravating her hip pain.  I do believe that an additional injection would be helpful diagnostically and therapeutically so that we can ascertain if her pincer impingement is the source of her underlying pain  Plan :    -Right hip ultrasound-guided injection after verbal consent obtained     Procedure Note  Patient: Ann Taylor             Date of Birth: Jun 06, 1981           MRN: [id number]             Visit Date: 05/22/2023  Procedures: Visit Diagnoses:  1. Hip impingement syndrome, right     Large Joint Inj: R hip joint on 05/22/2023 10:50 AM Indications: pain Details: 22 G 3.5 in needle, ultrasound-guided anterolateral approach  Arthrogram: No  Medications: 4 mL lidocaine 1 %; 80 mg triamcinolone acetonide 40 MG/ML Outcome: tolerated well, no immediate complications Procedure, treatment alternatives, risks and benefits explained, specific risks discussed. Consent was given by the patient. Immediately prior to procedure a time out was called to verify the correct patient, procedure, equipment, support staff and site/side marked as required. Patient was prepped and draped in the usual sterile fashion.          I personally saw and evaluated the patient, and participated in the management and treatment plan.  Huel Cote, MD Attending Physician, Orthopedic Surgery  This document was dictated using Dragon voice recognition software. A reasonable attempt at proof reading has been made to minimize errors.

## 2023-05-23 ENCOUNTER — Ambulatory Visit (HOSPITAL_BASED_OUTPATIENT_CLINIC_OR_DEPARTMENT_OTHER): Payer: 59 | Admitting: Orthopaedic Surgery

## 2023-07-15 ENCOUNTER — Other Ambulatory Visit: Payer: Self-pay | Admitting: Orthopaedic Surgery

## 2023-07-15 ENCOUNTER — Telehealth (HOSPITAL_BASED_OUTPATIENT_CLINIC_OR_DEPARTMENT_OTHER): Payer: Self-pay | Admitting: Orthopaedic Surgery

## 2023-07-15 MED ORDER — METHYLPREDNISOLONE 4 MG PO TBPK: ORAL_TABLET | ORAL | 0 refills | Status: DC

## 2023-07-15 NOTE — Telephone Encounter (Signed)
Patient wants a script called in for prednisone she is in pain

## 2023-07-24 ENCOUNTER — Telehealth: Payer: Self-pay | Admitting: Orthopaedic Surgery

## 2023-07-24 NOTE — Telephone Encounter (Signed)
Patient can not lay on her right side. Burning sensation. Would like something else to take. She can hardly sit. Her cb# [phone]

## 2023-07-24 NOTE — Telephone Encounter (Signed)
Spoke to pt and discussed this.  She hasn't picked up the Medrol pack yet but will start on it soon and let us know if it helps give her relief.

## 2023-08-04 DIAGNOSIS — R300 Dysuria: Secondary | ICD-10-CM | POA: Diagnosis not present

## 2023-08-04 DIAGNOSIS — N3090 Cystitis, unspecified without hematuria: Secondary | ICD-10-CM | POA: Diagnosis not present

## 2023-08-15 ENCOUNTER — Ambulatory Visit (HOSPITAL_BASED_OUTPATIENT_CLINIC_OR_DEPARTMENT_OTHER): Payer: Self-pay | Admitting: Orthopaedic Surgery

## 2023-08-15 ENCOUNTER — Ambulatory Visit (INDEPENDENT_AMBULATORY_CARE_PROVIDER_SITE_OTHER): Payer: 59 | Admitting: Orthopaedic Surgery

## 2023-08-15 ENCOUNTER — Other Ambulatory Visit (HOSPITAL_BASED_OUTPATIENT_CLINIC_OR_DEPARTMENT_OTHER): Payer: Self-pay

## 2023-08-15 DIAGNOSIS — M25851 Other specified joint disorders, right hip: Secondary | ICD-10-CM

## 2023-08-15 MED ORDER — ASPIRIN 325 MG PO TBEC
325.0000 mg | DELAYED_RELEASE_TABLET | Freq: Every day | ORAL | 0 refills | Status: DC
Start: 2023-08-15 — End: 2024-02-20

## 2023-08-15 MED ORDER — ACETAMINOPHEN 500 MG PO TABS: 500.0000 mg | ORAL_TABLET | Freq: Three times a day (TID) | ORAL | 0 refills | Status: AC

## 2023-08-15 MED ORDER — OXYCODONE HCL 5 MG PO TABS: 5.0000 mg | ORAL_TABLET | ORAL | 0 refills | Status: DC | PRN

## 2023-08-15 MED ORDER — IBUPROFEN 800 MG PO TABS: 800.0000 mg | ORAL_TABLET | Freq: Three times a day (TID) | ORAL | 0 refills | Status: AC

## 2023-08-15 NOTE — Progress Notes (Signed)
Chief Complaint: Right hip pain     History of Present Illness:   08/15/2023: Today for follow-up of her right hip.  She did get 1 day of extremely good relief from an injection.  This felt like she had little to 0 pain on that day.  Ann Taylor is a 42 y.o. female presents today with ongoing right hip pain which has been bothersome for last year.  She states this has been a chronic condition which has been bothering her specifically in the groin area.  She did recently have an x-ray with her primary care who was told that there was a concern for extra bone.  She has undergone physical therapy although she had to stop this due to persistent pain.  She has previously had injections with little relief.  She does have a history of a lumbar MRI where there was a concern for a disc herniation.  This time she is having a very difficult time laying directly on the side or doing basic activities like sitting for prolonged periods.  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    Surgical History:   None  PMH/PSH/Family History/Social History/Meds/Allergies:    Past Medical History:  Diagnosis Date   Arrhythmia    Headache(784.0)    Heart murmur    UTI (lower urinary tract infection)    Past Surgical History:  Procedure Laterality Date   BREAST BIOPSY Left 04/17/2023   MM LT BREAST BX W LOC DEV 1ST LESION IMAGE BX SPEC STEREO GUIDE 04/17/2023 [facility]   CESAREAN SECTION     Social History   Socioeconomic History   Marital status: Single    Spouse name: Not on file   Number of children: Not on file   Years of education: Not on file   Highest education level: Not on file  Occupational History   Not on file  Tobacco Use   Smoking status: Never   Smokeless tobacco: Never  Vaping Use   Vaping status: Never Used  Substance and Sexual Activity   Alcohol use: No   Drug use: No   Sexual activity: Yes    Birth control/protection: None   Other Topics Concern   Not on file  Social History Narrative   Not on file   Social Determinants of Health   Financial Resource Strain: At Risk (04/25/2023)   Received from [place], [employer]    Financial Resource Strain: 2  Food Insecurity: Not on file (04/25/2023)  Transportation Needs: Not at Risk (04/25/2023)   Received from [place], [employer] Needs    Transportation: 1  Physical Activity: Not on File (03/10/2023)   Received from [place], [place]   Physical Activity    Physical Activity: 0  Stress: Not on File (03/10/2023)   Received from [facility], [place]   Stress    Stress: 0  Social Connections: Not on File (07/29/2023)   Received from [facility]   Social Connections    Connectedness: 0   Family History  Problem Relation Age of Onset   Alcohol abuse Neg Hx    Arthritis Neg Hx    Asthma Neg Hx    Birth defects Neg Hx    Cancer Neg Hx    COPD Neg Hx  Depression Neg Hx    Diabetes Neg Hx    Drug abuse Neg Hx    Early death Neg Hx    Hearing loss Neg Hx    Heart disease Neg Hx    Hyperlipidemia Neg Hx    Hypertension Neg Hx    Kidney disease Neg Hx    Learning disabilities Neg Hx    Mental illness Neg Hx    Mental retardation Neg Hx    Miscarriages / Stillbirths Neg Hx    Stroke Neg Hx    Vision loss Neg Hx    Allergies  Allergen Reactions   Oxycodone Hives and Itching   Shellfish Allergy Anaphylaxis   Shellfish-Derived Products Anaphylaxis   Codone [Hydrocodone] Itching   Codeine Hives, Itching and Rash   Tramadol Hives, Itching and Anxiety   Current Outpatient Medications  Medication Sig Dispense Refill   acetaminophen (TYLENOL) 500 MG tablet Take 1 tablet (500 mg total) by mouth every 8 (eight) hours for 10 days. 30 tablet 0   aspirin EC 325 MG tablet Take 1 tablet (325 mg total) by mouth daily. 14 tablet 0   ibuprofen (ADVIL) 800 MG tablet Take 1 tablet (800 mg total) by mouth every 8 (eight) hours for 10 days. Please take with  food, please alternate with acetaminophen 30 tablet 0   methylPREDNISolone (MEDROL DOSEPAK) 4 MG TBPK tablet Take per packet instructions 1 each 0   oxyCODONE (ROXICODONE) 5 MG immediate release tablet Take 1 tablet (5 mg total) by mouth every 4 (four) hours as needed for severe pain or breakthrough pain. 10 tablet 0   gabapentin (NEURONTIN) 300 MG capsule Take 300 mg by mouth 2 (two) times daily.     lidocaine (LIDODERM) 5 % Place 1 patch onto the skin daily. Remove & Discard patch within 12 hours or as directed by MD 30 patch 0   methocarbamol (ROBAXIN) 500 MG tablet Take 1 tablet (500 mg total) by mouth 2 (two) times daily. 20 tablet 0   metroNIDAZOLE (FLAGYL) 500 MG tablet Take 1 tablet (500 mg total) by mouth 2 (two) times daily. 14 tablet 0   predniSONE (DELTASONE) 20 MG tablet Take 1 tablet (20 mg total) by mouth daily. 5 tablet 0   triamterene-hydrochlorothiazide (DYAZIDE) 37.5-25 MG capsule Take 1 each (1 capsule total) by mouth daily. 15 capsule 0   No current facility-administered medications for this visit.   No results found.  Review of Systems:   A ROS was performed including pertinent positives and negatives as documented in the HPI.  Physical Exam :   Constitutional: NAD and appears stated age Neurological: Alert and oriented Psych: Appropriate affect and cooperative There were no vitals taken for this visit.   Comprehensive Musculoskeletal Exam:    Inspection Right Left  Skin No atrophy or gross abnormalities appreciated No atrophy or gross abnormalities appreciated  Palpation    Tenderness None None  Crepitus None None  Range of Motion    Flexion (passive) 120 120  Extension 30 30  IR 30 with pain 30  ER 45 45  Strength    Flexion  5/5 5/5  Extension 5/5 5/5  Special Tests    FABER Negative Negative  FADIR Positive Negative  ER Lag/Capsular Insufficiency Negative Negative  Instability Negative Negative  Sacroiliac pain Negative  Negative   Instability     Generalized Laxity No No  Neurologic    sciatic, femoral, obturator nerves intact to light sensation  Vascular/Lymphatic  DP pulse 2+ 2+  Lumbar Exam    Patient has symmetric lumbar range of motion with negative pain referral to hip     Imaging:   Xray (4 views right hip): Significant pincer impingement with well-maintained femoral acetabular joint  MRI right hip: Right hip labral tear with lateral center edge angle of 55 degrees  I personally reviewed and interpreted the radiographs.   Assessment:   42 y.o. female with right hip femoral acetabular impingement which has been significantly bothersome for the course of this year.  This is been limiting essentially all activities of daily living with persistent pain.  She is having an extremely hard time with even walking or sitting for longer periods.  She did get extremely good relief from an ultrasound-guided injection of the femoral acetabular joint.  I do believe that her large pincer lesion is specifically causing her to have persistent pain.  She has trialed strengthening of the right hip without any relief.  Given this we did discuss surgical intervention.  Specifically I did discuss right hip arthroscopy with pincer debridement as well as labral repair.  I did discuss the risks and limitations as well as the associated rehab.  She would like to proceed with this after discussion  Plan :    -Plan for right hip arthroscopy with pincer debridement labral repair   After a lengthy discussion of treatment options, including risks, benefits, alternatives, complications of surgical and nonsurgical conservative options, the patient elected surgical repair.   The patient  is aware of the material risks  and complications including, but not limited to injury to adjacent structures, neurovascular injury, infection, numbness, bleeding, implant failure, thermal burns, stiffness, persistent pain, failure to heal, disease transmission from  allograft, need for further surgery, dislocation, anesthetic risks, blood clots, risks of death,and others. The probabilities of surgical success and failure discussed with patient given their particular co-morbidities.The time and nature of expected rehabilitation and recovery was discussed.The patient's questions were all answered preoperatively.  No barriers to understanding were noted. I explained the natural history of the disease process and Rx rationale.  I explained to the patient what I considered to be reasonable expectations given their personal situation.  The final treatment plan was arrived at through a shared patient decision making process model.         I personally saw and evaluated the patient, and participated in the management and treatment plan.  Huel Cote, MD Attending Physician, Orthopedic Surgery  This document was dictated using Dragon voice recognition software. A reasonable attempt at proof reading has been made to minimize errors.

## 2023-08-20 ENCOUNTER — Other Ambulatory Visit (HOSPITAL_BASED_OUTPATIENT_CLINIC_OR_DEPARTMENT_OTHER): Payer: Self-pay | Admitting: Orthopaedic Surgery

## 2023-08-20 DIAGNOSIS — M25851 Other specified joint disorders, right hip: Secondary | ICD-10-CM

## 2023-09-08 ENCOUNTER — Other Ambulatory Visit: Payer: Self-pay | Admitting: Orthopaedic Surgery

## 2023-09-08 ENCOUNTER — Telehealth: Payer: Self-pay | Admitting: Orthopaedic Surgery

## 2023-09-08 ENCOUNTER — Encounter (HOSPITAL_BASED_OUTPATIENT_CLINIC_OR_DEPARTMENT_OTHER): Payer: Self-pay

## 2023-09-08 MED ORDER — METHOCARBAMOL 500 MG PO TABS: 500.0000 mg | ORAL_TABLET | Freq: Four times a day (QID) | ORAL | 4 refills | Status: DC

## 2023-09-08 NOTE — Telephone Encounter (Signed)
Patient would like a muscle relaxer called in for her. Her cb# [phone]

## 2023-09-22 ENCOUNTER — Other Ambulatory Visit (HOSPITAL_BASED_OUTPATIENT_CLINIC_OR_DEPARTMENT_OTHER): Payer: Self-pay | Admitting: Orthopaedic Surgery

## 2023-09-22 DIAGNOSIS — M25851 Other specified joint disorders, right hip: Secondary | ICD-10-CM

## 2023-10-01 ENCOUNTER — Ambulatory Visit (HOSPITAL_BASED_OUTPATIENT_CLINIC_OR_DEPARTMENT_OTHER): Payer: 59 | Admitting: Orthopaedic Surgery

## 2023-10-04 ENCOUNTER — Ambulatory Visit (HOSPITAL_BASED_OUTPATIENT_CLINIC_OR_DEPARTMENT_OTHER): Payer: 59 | Attending: Orthopaedic Surgery | Admitting: Physical Therapy

## 2023-10-04 ENCOUNTER — Other Ambulatory Visit: Payer: Self-pay

## 2023-10-04 ENCOUNTER — Encounter (HOSPITAL_BASED_OUTPATIENT_CLINIC_OR_DEPARTMENT_OTHER): Payer: Self-pay | Admitting: Physical Therapy

## 2023-10-04 DIAGNOSIS — M25851 Other specified joint disorders, right hip: Secondary | ICD-10-CM | POA: Diagnosis not present

## 2023-10-04 DIAGNOSIS — M25551 Pain in right hip: Secondary | ICD-10-CM

## 2023-10-04 DIAGNOSIS — R262 Difficulty in walking, not elsewhere classified: Secondary | ICD-10-CM

## 2023-10-04 NOTE — Therapy (Signed)
OUTPATIENT PHYSICAL THERAPY EVALUATION   Patient Name: Ann Taylor MRN: [id number] DOB:1980-12-13, 42 y.o., female Today's Date: 10/04/2023  END OF SESSION:  PT End of Session - 10/04/23 0949     Visit Number 1    Number of Visits 6    Date for PT Re-Evaluation 11/08/23    PT Start Time 0915    PT Stop Time 0948    PT Time Calculation (min) 33 min    Activity Tolerance Patient tolerated treatment well;Patient limited by pain    Behavior During Therapy Ann Taylor for tasks assessed/performed             Past Medical History:  Diagnosis Date   Arrhythmia    Headache(784.0)    Heart murmur    UTI (lower urinary tract infection)    Past Surgical History:  Procedure Laterality Date   BREAST BIOPSY Left 04/17/2023   MM LT BREAST BX W LOC DEV 1ST LESION IMAGE BX SPEC STEREO GUIDE 04/17/2023 [facility]   CESAREAN SECTION     Patient Active Problem List   Diagnosis Date Noted   Visit for routine gyn exam 03/27/2023   STD exposure 03/27/2023   Nexplanon in place 03/27/2023   Bilateral radiating leg pain 02/12/2023   Mood disorder (HCC) 02/12/2023   Essential hypertension 07/28/2021   Family history of sickle cell anemia 07/28/2021   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   Neuropathy 07/28/2021   Other allergy, initial encounter 07/28/2021   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 07/26/2021   Migraine 07/26/2021      REFERRING PROVIDER:  Huel Cote, MD     REFERRING DIAG:  [id number] (ICD-10-CM) - Hip impingement syndrome, right      Rationale for Evaluation and Treatment: Rehabilitation  THERAPY DIAG:  Pain in right hip  Difficulty in walking, not elsewhere classified  ONSET DATE: chronic   SUBJECTIVE:                                                                                                                                                                                           SUBJECTIVE STATEMENT: Pincer debridement and labral repair scheduled for 12/16. Oxy was  good for pain but still made me itch when I took with benadryl.  Mywhole right leg really hurts- try to take ibuprofen about 2/week because they do help but the pain is getting worse.   PERTINENT HISTORY:  Chronic nature  PAIN:  Are you having pain? Yes: NPRS scale: severe/10 Pain location: Rt hip/groin and into leg Pain description: constant Aggravating factors: everything Relieving factors: meds  PRECAUTIONS:  None  RED FLAGS: None   WEIGHT BEARING  RESTRICTIONS:  No  FALLS:  Has patient fallen in last 6 months? 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   PLOF:  Independent  PATIENT GOALS:  Decrease pain, prep for surgery    OBJECTIVE:  Note: Objective measures were completed at Evaluation unless otherwise noted.  POSTURE:  Eval- tendency to favor LLE    GAIT: Eval: Comments: antalgic, flexed posture   TREATMENT:                                                                                                                               DATE: EVAL 10/04/23  Trigger Point Dry Needling, Manual Therapy Treatment:  Initial or subsequent education regarding Trigger Point Dry Needling: Initial - education time included in Manual Did patient give consent to treatment with Trigger Point Dry Needling: Yes TPDN with skilled palpation and monitoring followed by STM to the following muscles: Rt glut med, min, piriformis  Long axis distraction- edu on self tractoin on edge of step Prone Rt SIJ spring mob Prone lumbar traction by PT     PATIENT EDUCATION:  Education details: Anatomy of condition, POC, HEP, exercise form/rationale Person educated: Patient Education method: Explanation, Demonstration, Tactile cues, Verbal cues, and Handouts Education comprehension: verbalized understanding, returned demonstration, verbal cues required, tactile cues required, and needs further education  HOME EXERCISE PROGRAM: Self distraction on step   ASSESSMENT:  CLINICAL  IMPRESSION: Patient is a 42 y.o. F who was seen today for physical therapy evaluation and treatment for prehab prior to hip labral repair and pincer debridement. Focused on pain reduction today by addressing spasm. Antalgic gait and quick pain increases indicates need for stability training but would not be appropriate to test today with high pain levels. Will continue to focus on reduction in pain/spasm/compensation pattern in order to improve surgical outcome.       REHAB POTENTIAL: Fair prehab  CLINICAL DECISION MAKING: Stable/uncomplicated  EVALUATION COMPLEXITY: Low   GOALS: Goals reviewed with patient? Yes  SHORT TERM GOALS: =LTGs  LONG TERM GOALS: Target date: POC date  Pt will verbalize decrease in overall pain levels Baseline:  Goal status: INITIAL  2.  Pain management through moving- occurring today Baseline:  Goal status: INITIAL  3.  Verbalize understanding in post op activities Baseline:  Goal status: INITIAL  4.  Demo proper gait pattern with crutches in prep for surgery Baseline:  Goal status: INITIAL    PLAN:  PT FREQUENCY: 1-2x/week  PT DURATION: other: POC date  PLANNED INTERVENTIONS: 97164- PT Re-evaluation, 97110-Therapeutic exercises, 97530- Therapeutic activity, 97112- Neuromuscular re-education, 97535- Self Care, 65784- Manual therapy, [phone]- Gait training, [phone]- Aquatic Therapy, Patient/Family education, Balance training, Stair training, Taping, Dry Needling, Joint mobilization, Spinal mobilization, Cryotherapy, and Moist heat.  PLAN FOR NEXT SESSION: DN PRN, core stability, glut activation   Ann Taylor C. Ann Taylor PT, DPT 10/04/23 12:46 PM

## 2023-10-11 ENCOUNTER — Encounter (HOSPITAL_BASED_OUTPATIENT_CLINIC_OR_DEPARTMENT_OTHER): Payer: Self-pay | Admitting: Physical Therapy

## 2023-10-11 ENCOUNTER — Ambulatory Visit (HOSPITAL_BASED_OUTPATIENT_CLINIC_OR_DEPARTMENT_OTHER): Payer: 59 | Admitting: Physical Therapy

## 2023-10-11 DIAGNOSIS — M25551 Pain in right hip: Secondary | ICD-10-CM

## 2023-10-11 DIAGNOSIS — M25851 Other specified joint disorders, right hip: Secondary | ICD-10-CM | POA: Diagnosis not present

## 2023-10-11 DIAGNOSIS — R262 Difficulty in walking, not elsewhere classified: Secondary | ICD-10-CM

## 2023-10-11 NOTE — Therapy (Deleted)
OUTPATIENT PHYSICAL THERAPY EVALUATION   Patient Name: Ann Taylor MRN: [id number] DOB:10-27-1981, 42 y.o., female Today's Date: 10/11/2023  END OF SESSION:    Past Medical History:  Diagnosis Date   Arrhythmia    Headache(784.0)    Heart murmur    UTI (lower urinary tract infection)    Past Surgical History:  Procedure Laterality Date   BREAST BIOPSY Left 04/17/2023   MM LT BREAST BX W LOC DEV 1ST LESION IMAGE BX SPEC STEREO GUIDE 04/17/2023 [facility]   CESAREAN SECTION     Patient Active Problem List   Diagnosis Date Noted   Visit for routine gyn exam 03/27/2023   STD exposure 03/27/2023   Nexplanon in place 03/27/2023   Bilateral radiating leg pain 02/12/2023   Mood disorder (HCC) 02/12/2023   Essential hypertension 07/28/2021   Family history of sickle cell anemia 07/28/2021   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   Neuropathy 07/28/2021   Other allergy, initial encounter 07/28/2021   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 07/26/2021   Migraine 07/26/2021      REFERRING PROVIDER:  Huel Cote, MD     REFERRING DIAG:  [phone] (ICD-10-CM) - Hip impingement syndrome, right      Rationale for Evaluation and Treatment: Rehabilitation  THERAPY DIAG:  No diagnosis found.  ONSET DATE: chronic   SUBJECTIVE:                                                                                                                                                                                           SUBJECTIVE STATEMENT: Pincer debridement and labral repair scheduled for 12/16. Oxy was good for pain but still made me itch when I took with benadryl.  Mywhole right leg really hurts- try to take ibuprofen about 2/week because they do help but the pain is getting worse.   PERTINENT HISTORY:  Chronic nature  PAIN:  Are you having pain? Yes: NPRS scale: severe/10 Pain location: Rt hip/groin and into leg Pain description: constant Aggravating factors: everything Relieving  factors: meds  PRECAUTIONS:  None  RED FLAGS: None   WEIGHT BEARING RESTRICTIONS:  No  FALLS:  Has patient fallen in last 6 months? 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   PLOF:  Independent  PATIENT GOALS:  Decrease pain, prep for surgery    OBJECTIVE:  Note: Objective measures were completed at Evaluation unless otherwise noted.  POSTURE:  Eval- tendency to favor LLE    GAIT: Eval: Comments: antalgic, flexed posture   TREATMENT:  DATE: EVAL 10/04/23  Trigger Point Dry Needling, Manual Therapy Treatment:  Initial or subsequent education regarding Trigger Point Dry Needling: Initial - education time included in Manual Did patient give consent to treatment with Trigger Point Dry Needling: Yes TPDN with skilled palpation and monitoring followed by STM to the following muscles: Rt glut med, min, piriformis  Long axis distraction- edu on self tractoin on edge of step Prone Rt SIJ spring mob Prone lumbar traction by PT     PATIENT EDUCATION:  Education details: Anatomy of condition, POC, HEP, exercise form/rationale Person educated: Patient Education method: Explanation, Demonstration, Tactile cues, Verbal cues, and Handouts Education comprehension: verbalized understanding, returned demonstration, verbal cues required, tactile cues required, and needs further education  HOME EXERCISE PROGRAM: Self distraction on step   ASSESSMENT:  CLINICAL IMPRESSION: Patient is a 42 y.o. F who was seen today for physical therapy evaluation and treatment for prehab prior to hip labral repair and pincer debridement. Focused on pain reduction today by addressing spasm. Antalgic gait and quick pain increases indicates need for stability training but would not be appropriate to test today with high pain levels. Will continue to focus on reduction in  pain/spasm/compensation pattern in order to improve surgical outcome.       REHAB POTENTIAL: Fair prehab  CLINICAL DECISION MAKING: Stable/uncomplicated  EVALUATION COMPLEXITY: Low   GOALS: Goals reviewed with patient? Yes  SHORT TERM GOALS: =LTGs  LONG TERM GOALS: Target date: POC date  Pt will verbalize decrease in overall pain levels Baseline:  Goal status: INITIAL  2.  Pain management through moving- occurring today Baseline:  Goal status: INITIAL  3.  Verbalize understanding in post op activities Baseline:  Goal status: INITIAL  4.  Demo proper gait pattern with crutches in prep for surgery Baseline:  Goal status: INITIAL    PLAN:  PT FREQUENCY: 1-2x/week  PT DURATION: other: POC date  PLANNED INTERVENTIONS: 97164- PT Re-evaluation, 97110-Therapeutic exercises, 97530- Therapeutic activity, 97112- Neuromuscular re-education, 97535- Self Care, 16109- Manual therapy, [phone]- Gait training, [phone]- Aquatic Therapy, Patient/Family education, Balance training, Stair training, Taping, Dry Needling, Joint mobilization, Spinal mobilization, Cryotherapy, and Moist heat.  PLAN FOR NEXT SESSION: DN PRN, core stability, glut activation   Royal Hawthorn PT, DPT 10/11/23  8:59 AM

## 2023-10-11 NOTE — Therapy (Signed)
OUTPATIENT PHYSICAL THERAPY EVALUATION   Patient Name: Ann Taylor MRN: [id number] DOB:December 24, 1980, 42 y.o., female Today's Date: 10/11/2023  END OF SESSION:  PT End of Session - 10/11/23 0945     Visit Number 2    Number of Visits 6    Date for PT Re-Evaluation 11/08/23    Authorization Type A[employer]    PT Start Time 0930    PT Stop Time 1000    PT Time Calculation (min) 30 min    Activity Tolerance Patient tolerated treatment well;Patient limited by pain    Behavior During Therapy [facility] for tasks assessed/performed              Past Medical History:  Diagnosis Date   Arrhythmia    Headache(784.0)    Heart murmur    UTI (lower urinary tract infection)    Past Surgical History:  Procedure Laterality Date   BREAST BIOPSY Left 04/17/2023   MM LT BREAST BX W LOC DEV 1ST LESION IMAGE BX SPEC STEREO GUIDE 04/17/2023 [facility]   CESAREAN SECTION     Patient Active Problem List   Diagnosis Date Noted   Visit for routine gyn exam 03/27/2023   STD exposure 03/27/2023   Nexplanon in place 03/27/2023   Bilateral radiating leg pain 02/12/2023   Mood disorder (HCC) 02/12/2023   Essential hypertension 07/28/2021   Family history of sickle cell anemia 07/28/2021   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   Neuropathy 07/28/2021   Other allergy, initial encounter 07/28/2021   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 07/26/2021   Migraine 07/26/2021      REFERRING PROVIDER:  Huel Cote, MD     REFERRING DIAG:  [phone] (ICD-10-CM) - Hip impingement syndrome, right      Rationale for Evaluation and Treatment: Rehabilitation  THERAPY DIAG:  Pain in right hip  Difficulty in walking, not elsewhere classified  ONSET DATE: chronic   SUBJECTIVE:                                                                                                                                                                                           SUBJECTIVE STATEMENT: I am having some pain into my back  today. Some days I over do it, and I can feel it the next day.   PERTINENT HISTORY:  Chronic nature  PAIN:  Are you having pain? Yes: NPRS scale: severe/10 Pain location: Rt hip/groin and into leg Pain description: constant Aggravating factors: everything Relieving factors: meds  PRECAUTIONS:  None  RED FLAGS: None   WEIGHT BEARING RESTRICTIONS:  No  FALLS:  Has patient fallen in last 6 months? 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   PLOF:  Independent  PATIENT GOALS:  Decrease pain, prep for surgery    OBJECTIVE:  Note: Objective measures were completed at Evaluation unless otherwise noted.  POSTURE:  Eval- tendency to favor LLE    GAIT: Eval: Comments: antalgic, flexed posture   TREATMENT:      Date: 10/11/2023: Nustep 5 min, lvl 2  LTR x30  Ball squeezes supine x30, 5 sec holds  Glute squeezes, 5 sec hold                                                                                                                            DATE: EVAL 10/04/23  Trigger Point Dry Needling, Manual Therapy Treatment:  Initial or subsequent education regarding Trigger Point Dry Needling: Initial - education time included in Manual Did patient give consent to treatment with Trigger Point Dry Needling: Yes TPDN with skilled palpation and monitoring followed by STM to the following muscles: Rt glut med, min, piriformis  Long axis distraction- edu on self tractoin on edge of step Prone Rt SIJ spring mob Prone lumbar traction by PT     PATIENT EDUCATION:  Education details: Anatomy of condition, POC, HEP, exercise form/rationale Person educated: Patient Education method: Explanation, Demonstration, Tactile cues, Verbal cues, and Handouts Education comprehension: verbalized understanding, returned demonstration, verbal cues required, tactile cues required, and needs further education  HOME EXERCISE PROGRAM: Self distraction on step   ASSESSMENT:  CLINICAL  IMPRESSION: Pt reports to first f/u visit with continued pain. She reports a stabbing pain in her groin that is radiating into her hip. Pt tolerated session with no increase in pain, but required slow movements and breaks. Discussed use of TENS unit to help modulate pain. Pt encouraged continued movement and mobility as tolerated at home. Pt will continue to benefit from skilled PT to address continued deficits.     REHAB POTENTIAL: Fair prehab  CLINICAL DECISION MAKING: Stable/uncomplicated  EVALUATION COMPLEXITY: Low   GOALS: Goals reviewed with patient? Yes  SHORT TERM GOALS: =LTGs  LONG TERM GOALS: Target date: POC date  Pt will verbalize decrease in overall pain levels Baseline:  Goal status: INITIAL  2.  Pain management through moving- occurring today Baseline:  Goal status: INITIAL  3.  Verbalize understanding in post op activities Baseline:  Goal status: INITIAL  4.  Demo proper gait pattern with crutches in prep for surgery Baseline:  Goal status: INITIAL    PLAN:  PT FREQUENCY: 1-2x/week  PT DURATION: other: POC date  PLANNED INTERVENTIONS: 97164- PT Re-evaluation, 97110-Therapeutic exercises, 97530- Therapeutic activity, 97112- Neuromuscular re-education, 97535- Self Care, 41660- Manual therapy, [phone]- Gait training, [phone]- Aquatic Therapy, Patient/Family education, Balance training, Stair training, Taping, Dry Needling, Joint mobilization, Spinal mobilization, Cryotherapy, and Moist heat.  PLAN FOR NEXT SESSION: DN PRN, core stability, glut activation   Royal Hawthorn PT, DPT 10/11/23  10:51 AM

## 2023-10-18 ENCOUNTER — Encounter (HOSPITAL_BASED_OUTPATIENT_CLINIC_OR_DEPARTMENT_OTHER): Payer: Self-pay | Admitting: Physical Therapy

## 2023-10-18 ENCOUNTER — Ambulatory Visit (HOSPITAL_BASED_OUTPATIENT_CLINIC_OR_DEPARTMENT_OTHER): Payer: 59 | Admitting: Physical Therapy

## 2023-10-18 DIAGNOSIS — M25851 Other specified joint disorders, right hip: Secondary | ICD-10-CM | POA: Diagnosis not present

## 2023-10-18 DIAGNOSIS — M25551 Pain in right hip: Secondary | ICD-10-CM

## 2023-10-18 DIAGNOSIS — R262 Difficulty in walking, not elsewhere classified: Secondary | ICD-10-CM

## 2023-10-18 NOTE — Therapy (Signed)
OUTPATIENT PHYSICAL THERAPY EVALUATION   Patient Name: Ann Taylor MRN: [id number] DOB:07/29/1981, 42 y.o., female Today's Date: 10/18/2023  END OF SESSION:  PT End of Session - 10/18/23 0921     Visit Number 3    Number of Visits 6    Date for PT Re-Evaluation 11/08/23    Authorization Type [employer]    PT Start Time 0920    PT Stop Time 0956    PT Time Calculation (min) 36 min    Activity Tolerance Patient tolerated treatment well;Patient limited by pain    Behavior During Therapy [facility] for tasks assessed/performed               Past Medical History:  Diagnosis Date   Arrhythmia    Headache(784.0)    Heart murmur    UTI (lower urinary tract infection)    Past Surgical History:  Procedure Laterality Date   BREAST BIOPSY Left 04/17/2023   MM LT BREAST BX W LOC DEV 1ST LESION IMAGE BX SPEC STEREO GUIDE 04/17/2023 [facility]   CESAREAN SECTION     Patient Active Problem List   Diagnosis Date Noted   Visit for routine gyn exam 03/27/2023   STD exposure 03/27/2023   Nexplanon in place 03/27/2023   Bilateral radiating leg pain 02/12/2023   Mood disorder (HCC) 02/12/2023   Essential hypertension 07/28/2021   Family history of sickle cell anemia 07/28/2021   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   Neuropathy 07/28/2021   Other allergy, initial encounter 07/28/2021   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 Denies N/T. Recently stopped feeling pain down to feet- only gets that way with extreme pain- mostly after a long day at work and cannot move for a couple of hours. Oxy + benadryl takes pain away but I cannot sleep.  PERTINENT HISTORY:  Chronic nature  PAIN:  Are you having pain? Yes: NPRS scale: severe/10 Pain location: Rt hip/groin and into leg Pain description: constant Aggravating factors: everything Relieving factors: meds  PRECAUTIONS:  None  RED FLAGS: None   WEIGHT BEARING RESTRICTIONS:  No  FALLS:  Has patient fallen in last 6 months? 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   PLOF:  Independent  PATIENT GOALS:  Decrease pain, prep for surgery    OBJECTIVE:  Note: Objective measures were completed at Evaluation unless otherwise noted.  POSTURE:  Eval- tendency to favor LLE    GAIT: Eval: Comments: antalgic, flexed posture   TREATMENT:      Treatment                            11/23:  Sacral mobs-PA Lt upper and mid quadrant spring mobs STM bilateral gluts Gait with crutches and discussed pain modifications at home and and work   Date: 10/11/2023: Nustep 5 min, lvl 2  LTR x30  Ball squeezes supine x30, 5 sec holds  Glute squeezes, 5 sec hold                                                                                                                            DATE: EVAL 10/04/23  Trigger Point Dry Needling, Manual Therapy Treatment:  Initial or subsequent education regarding Trigger Point Dry Needling: Initial - education time included in Manual Did patient give consent to treatment with Trigger Point Dry Needling: Yes TPDN with skilled palpation and monitoring followed by STM to the following muscles: Rt glut med, min, piriformis  Long axis distraction- edu on self tractoin on edge of step Prone Rt SIJ spring mob Prone lumbar traction by PT     PATIENT EDUCATION:  Education details: Teacher, music of condition, POC, HEP,  exercise form/rationale Person educated: Patient Education method: Explanation, Demonstration, Tactile cues, Verbal cues, and Handouts Education comprehension: verbalized understanding, returned demonstration, verbal cues required, tactile cues required, and needs further education  HOME EXERCISE PROGRAM: Self distraction on step   ASSESSMENT:  CLINICAL IMPRESSION: Requested that pt ambulate with crutches until surgery to reduce WB as pain has become so severe. Will discuss possibility of moving surgery date to a sooner slot.     REHAB POTENTIAL: Fair prehab  CLINICAL DECISION MAKING: Stable/uncomplicated  EVALUATION COMPLEXITY: Low   GOALS: Goals reviewed with patient? Yes  SHORT TERM GOALS: =LTGs  LONG TERM GOALS: Target date: POC date  Pt will verbalize decrease in overall pain levels Baseline:  Goal status: INITIAL  2.  Pain management through moving- occurring today Baseline:  Goal status: INITIAL  3.  Verbalize understanding in post op activities Baseline:  Goal status: INITIAL  4.  Demo proper gait pattern with crutches in prep for surgery Baseline:  Goal status: INITIAL    PLAN:  PT FREQUENCY: 1-2x/week  PT DURATION: other: POC date  PLANNED INTERVENTIONS: 97164- PT Re-evaluation, 97110-Therapeutic exercises, 97530- Therapeutic activity, [id number]- Neuromuscular re-education, 97535- Self Care, 66440- Manual therapy, [phone]- Gait training, [phone]- Aquatic Therapy, Patient/Family education, Balance training,  Stair training, Taping, Dry Needling, Joint mobilization, Spinal mobilization, Cryotherapy, and Moist heat.  PLAN FOR NEXT SESSION: DN PRN, core stability, glut activation   Aury Scollard C. Nils Thor PT, DPT 10/18/23 9:58 AM

## 2023-10-25 ENCOUNTER — Ambulatory Visit (HOSPITAL_BASED_OUTPATIENT_CLINIC_OR_DEPARTMENT_OTHER): Payer: 59 | Admitting: Physical Therapy

## 2023-10-25 DIAGNOSIS — R262 Difficulty in walking, not elsewhere classified: Secondary | ICD-10-CM

## 2023-10-25 DIAGNOSIS — M25851 Other specified joint disorders, right hip: Secondary | ICD-10-CM | POA: Diagnosis not present

## 2023-10-25 DIAGNOSIS — M25551 Pain in right hip: Secondary | ICD-10-CM

## 2023-10-25 NOTE — Therapy (Signed)
OUTPATIENT PHYSICAL THERAPY EVALUATION   Patient Name: Ann Taylor MRN: [id number] DOB:09-07-1981, 42 y.o., female Today's Date: 10/25/2023  END OF SESSION:  PT End of Session - 10/25/23 0959     Visit Number 4    Number of Visits 6    Date for PT Re-Evaluation 11/08/23    Authorization Type Aet[employer]    PT Start Time [phone]    PT Stop Time 1000    PT Time Calculation (min) 43 min    Activity Tolerance Patient tolerated treatment well;Patient limited by pain    Behavior During Therapy [facility] for tasks assessed/performed                Past Medical History:  Diagnosis Date   Arrhythmia    Headache(784.0)    Heart murmur    UTI (lower urinary tract infection)    Past Surgical History:  Procedure Laterality Date   BREAST BIOPSY Left 04/17/2023   MM LT BREAST BX W LOC DEV 1ST LESION IMAGE BX SPEC STEREO GUIDE 04/17/2023 [facility]   CESAREAN SECTION     Patient Active Problem List   Diagnosis Date Noted   Visit for routine gyn exam 03/27/2023   STD exposure 03/27/2023   Nexplanon in place 03/27/2023   Bilateral radiating leg pain 02/12/2023   Mood disorder (HCC) 02/12/2023   Essential hypertension 07/28/2021   Family history of sickle cell anemia 07/28/2021   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   Neuropathy 07/28/2021   Other allergy, initial encounter 07/28/2021   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 07/26/2021   Migraine 07/26/2021      REFERRING PROVIDER:  Huel Cote, MD     REFERRING DIAG:  [phone] (ICD-10-CM) - Hip impingement syndrome, right      Rationale for Evaluation and Treatment: Rehabilitation  THERAPY DIAG:  Pain in right hip  Difficulty in walking, not elsewhere classified  ONSET DATE: chronic   SUBJECTIVE:                                                                                                                                                                                           SUBJECTIVE STATEMENT: I forgot to tell the last PT  that I have a slipped disc in my lower back. So after the "pressing last session I was in a lot of pain". I continue to have the N/T into my leg and foot.   PERTINENT HISTORY:  Chronic nature  PAIN:  Are you having pain? Yes: NPRS scale: severe/10 Pain location: Rt hip/groin and into leg Pain description: constant Aggravating factors: everything Relieving factors: meds  PRECAUTIONS:  None  RED FLAGS:  None   WEIGHT BEARING RESTRICTIONS:  No  FALLS:  Has patient fallen in last 6 months? 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   PLOF:  Independent  PATIENT GOALS:  Decrease pain, prep for surgery    OBJECTIVE:  Note: Objective measures were completed at Evaluation unless otherwise noted.  POSTURE:  Eval- tendency to favor LLE    GAIT: Eval: Comments: antalgic, flexed posture   TREATMENT:      Treatment                            11/30:  Nustep 7 min, lvl 4  Gait with crutches and discussed pain modifications at home and and work [employer] with cues to push through heels.  SLR with cues for quad control. x15 Gait with crutches and discussed pain modifications at home and and work Treatment                            11/23:  Sacral mobs-PA Lt upper and mid quadrant spring mobs STM bilateral gluts Gait with crutches and discussed pain modifications at home and and work   Date: 10/11/2023: Nustep 5 min, lvl 2  LTR x30  Ball squeezes supine x30, 5 sec holds  Glute squeezes, 5 sec hold                                                                                                                            DATE: EVAL 10/04/23  Trigger Point Dry Needling, Manual Therapy Treatment:  Initial or subsequent education regarding Trigger Point Dry Needling: Initial - education time included in Manual Did patient give consent to treatment with Trigger Point Dry Needling: Yes TPDN with skilled palpation and monitoring followed by STM to the following muscles: Rt glut  med, min, piriformis  Long axis distraction- edu on self tractoin on edge of step Prone Rt SIJ spring mob Prone lumbar traction by PT     PATIENT EDUCATION:  Education details: Anatomy of condition, POC, HEP, exercise form/rationale Person educated: Patient Education method: Explanation, Demonstration, Tactile cues, Verbal cues, and Handouts Education comprehension: verbalized understanding, returned demonstration, verbal cues required, tactile cues required, and needs further education  HOME EXERCISE PROGRAM: Self distraction on step   ASSESSMENT:  CLINICAL IMPRESSION: Reviewed gait with crutches. Pt reports only using crutches with community ambulation and at work. Continued with general LE strengthening. Cues provided for [facility]. Pt fatigues very quickly and is limited by pain in L groin and hip. Discussed sleeping positions with use of pillow to help minimize stress on hip musculature.      REHAB POTENTIAL: Fair prehab  CLINICAL DECISION MAKING: Stable/uncomplicated  EVALUATION COMPLEXITY: Low   GOALS: Goals reviewed with patient? Yes  SHORT TERM GOALS: =LTGs  LONG TERM GOALS: Target date: POC date  Pt will verbalize decrease in overall pain levels Baseline:  Goal status: INITIAL  2.  Pain management through moving- occurring today Baseline:  Goal status: INITIAL  3.  Verbalize understanding in post op activities Baseline:  Goal status: INITIAL  4.  Demo proper gait pattern with crutches in prep for surgery Baseline:  Goal status: INITIAL    PLAN:  PT FREQUENCY: 1-2x/week  PT DURATION: other: POC date  PLANNED INTERVENTIONS: 97164- PT Re-evaluation, 97110-Therapeutic exercises, 97530- Therapeutic activity, 97112- Neuromuscular re-education, 97535- Self Care, 40981- Manual therapy, [phone]- Gait training, [phone]- Aquatic Therapy, Patient/Family education, Balance training, Stair training, Taping, Dry Needling, Joint mobilization, Spinal mobilization,  Cryotherapy, and Moist heat.  PLAN FOR NEXT SESSION: DN PRN, core stability, glut activation   Royal Hawthorn PT, DPT 10/25/23  9:59 AM

## 2023-10-29 NOTE — Therapy (Signed)
OUTPATIENT PHYSICAL THERAPY TREATMENT   Patient Name: Ann Taylor MRN: [id number] DOB:02/03/1981, 42 y.o., female Today's Date: 10/31/2023  END OF SESSION:  PT End of Session - 10/30/23 0853     Visit Number 5    Number of Visits 6    Date for PT Re-Evaluation 11/08/23    Authorization Type Aetna    PT Start Time [phone]    PT Stop Time 0928    PT Time Calculation (min) 34 min    Activity Tolerance Patient tolerated treatment well;Patient limited by pain    Behavior During Therapy [facility] for tasks assessed/performed                 Past Medical History:  Diagnosis Date   Arrhythmia    Headache(784.0)    Heart murmur    UTI (lower urinary tract infection)    Past Surgical History:  Procedure Laterality Date   BREAST BIOPSY Left 04/17/2023   MM LT BREAST BX W LOC DEV 1ST LESION IMAGE BX SPEC STEREO GUIDE 04/17/2023 [facility]   CESAREAN SECTION     Patient Active Problem List   Diagnosis Date Noted   Visit for routine gyn exam 03/27/2023   STD exposure 03/27/2023   Nexplanon in place 03/27/2023   Bilateral radiating leg pain 02/12/2023   Mood disorder (HCC) 02/12/2023   Essential hypertension 07/28/2021   Family history of sickle cell anemia 07/28/2021   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   Neuropathy 07/28/2021   Other allergy, initial encounter 07/28/2021   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 Pt states she has pain with sitting and standing.  She is unable to lie on R side.  Pt states she can't do anything for a long period of time.  Pt denies any adverse effects after prior Rx.  Pt states she also has R ankle pain which she has been dealing with prior to the hip issue.     PERTINENT HISTORY:  Chronic nature  PAIN:  Are  you having pain? Yes: NPRS scale: 7/10 Pain location: Rt hip/groin.  Pt states the pain goes into her back. Pain description: constant, burning Aggravating factors: everything Relieving factors: meds  PRECAUTIONS:  None  RED FLAGS: None   WEIGHT BEARING RESTRICTIONS:  No  FALLS:  Has patient fallen in last 6 months? 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   PLOF:  Independent  PATIENT GOALS:  Decrease pain, prep for surgery    OBJECTIVE:  Note: Objective measures were completed at Evaluation unless otherwise noted.  POSTURE:  Eval- tendency to favor LLE    GAIT: Eval: Comments: antalgic, flexed posture   TREATMENT:      Treatment                            12/5: Reviewed current function, pain level, and response to prior Rx.  Nustep x 5 mins Lvl 4 bilat UE/LE Supine bridge 3x10 Hooklying hip add iso with ball 2x10 with 5 sec hold LAQ AROM x 10 reps, RTB approx 4-5 reps, 2# x 10 reps  PT educated pt concerning gait with crutches.  Educated pt concerning Wb'ing restrictions post op.  PT educated pt with typical TDWB'ing restrictions post op, but instructed pt to follow MD orders after surgery.   PT demonstrated TDWB'ing restrictions and pt practiced with cuing.   Treatment                            11/30:  Nustep 7 min, lvl 4  Gait with crutches and discussed pain modifications at home and and work [employer] with cues to push through heels.  SLR with cues for quad control. x15 Gait with crutches and discussed pain modifications at home and and work  Treatment                            11/23:  Sacral mobs-PA Lt  upper and mid quadrant spring mobs STM bilateral gluts Gait with crutches and discussed pain modifications at home and and work   Date: 10/11/2023: Nustep 5 min, lvl 2  LTR x30  Ball squeezes supine x30, 5 sec holds  Glute squeezes, 5 sec hold                                                                                                                            DATE: EVAL 10/04/23  Trigger Point Dry Needling, Manual Therapy Treatment:  Initial or subsequent education regarding Trigger Point Dry Needling: Initial - education time included in Manual Did patient give consent to treatment with Trigger Point Dry Needling: Yes TPDN with skilled palpation and monitoring followed by STM to the following muscles: Rt glut med, min, piriformis  Long axis distraction- edu on self tractoin on edge of step Prone Rt SIJ spring mob Prone lumbar traction by  PT     PATIENT EDUCATION:  Education details: Anatomy of condition, POC, HEP, exercise form/rationale, gait. Person educated: Patient Education method:  Explanation, Demonstration, Tactile cues, Verbal cues Education comprehension: verbalized understanding, returned demonstration, verbal cues required, tactile cues required, and needs further education  HOME EXERCISE PROGRAM: Self distraction on step   ASSESSMENT:  CLINICAL IMPRESSION: Pt enters the clinic with crutches.  Pt continues to have significant pain and is planning for surgery on 12/16.  PT educated pt concerning typical Wb'ing restrictions after surgery.  PT demonstrated and had pt perform also.  PT instructed pt to follow MD orders for Wb'ing restrictions post surgery.  Pt tolerated exercises well though reports feeling it in her knee with LAQ.  PT instructed pt to decrease height/extension ROM with LAQ and pt didn't feel it in her knee.  Pt performed exercises without increased hip pain.  She reports no increased pain, though was tired after Rx.    REHAB POTENTIAL: Fair  prehab  CLINICAL DECISION MAKING: Stable/uncomplicated  EVALUATION COMPLEXITY: Low   GOALS: Goals reviewed with patient? Yes  SHORT TERM GOALS: =LTGs  LONG TERM GOALS: Target date: POC date  Pt will verbalize decrease in overall pain levels Baseline:  Goal status: INITIAL  2.  Pain management through moving- occurring today Baseline:  Goal status: INITIAL  3.  Verbalize understanding in post op activities Baseline:  Goal status: INITIAL  4.  Demo proper gait pattern with crutches in prep for surgery Baseline:  Goal status: INITIAL    PLAN:  PT FREQUENCY: 1-2x/week  PT DURATION: other: POC date  PLANNED INTERVENTIONS: 97164- PT Re-evaluation, 97110-Therapeutic exercises, 97530- Therapeutic activity, 97112- Neuromuscular re-education, 97535- Self Care, 16109- Manual therapy, [phone]- Gait training, [phone]- Aquatic Therapy, Patient/Family education, Balance training, Stair training, Taping, Dry Needling, Joint mobilization, Spinal mobilization, Cryotherapy, and Moist heat.  PLAN FOR NEXT SESSION: DN PRN, core stability, glut activation   Audie Clear III PT, DPT 10/31/23 5:39 PM

## 2023-10-30 ENCOUNTER — Encounter (HOSPITAL_BASED_OUTPATIENT_CLINIC_OR_DEPARTMENT_OTHER): Payer: Self-pay | Admitting: Physical Therapy

## 2023-10-30 ENCOUNTER — Ambulatory Visit (HOSPITAL_BASED_OUTPATIENT_CLINIC_OR_DEPARTMENT_OTHER): Payer: 59 | Attending: Orthopaedic Surgery | Admitting: Physical Therapy

## 2023-10-30 DIAGNOSIS — M25551 Pain in right hip: Secondary | ICD-10-CM | POA: Diagnosis not present

## 2023-10-30 DIAGNOSIS — R262 Difficulty in walking, not elsewhere classified: Secondary | ICD-10-CM | POA: Diagnosis not present

## 2023-10-30 DIAGNOSIS — M6281 Muscle weakness (generalized): Secondary | ICD-10-CM | POA: Insufficient documentation

## 2023-10-30 DIAGNOSIS — M25571 Pain in right ankle and joints of right foot: Secondary | ICD-10-CM | POA: Insufficient documentation

## 2023-11-01 ENCOUNTER — Ambulatory Visit (HOSPITAL_BASED_OUTPATIENT_CLINIC_OR_DEPARTMENT_OTHER): Payer: 59 | Admitting: Rehabilitative and Restorative Service Providers"

## 2023-11-01 ENCOUNTER — Encounter (HOSPITAL_BASED_OUTPATIENT_CLINIC_OR_DEPARTMENT_OTHER): Payer: Self-pay | Admitting: Rehabilitative and Restorative Service Providers"

## 2023-11-01 DIAGNOSIS — R262 Difficulty in walking, not elsewhere classified: Secondary | ICD-10-CM

## 2023-11-01 DIAGNOSIS — M25551 Pain in right hip: Secondary | ICD-10-CM

## 2023-11-01 DIAGNOSIS — M25571 Pain in right ankle and joints of right foot: Secondary | ICD-10-CM

## 2023-11-01 DIAGNOSIS — M6281 Muscle weakness (generalized): Secondary | ICD-10-CM | POA: Diagnosis not present

## 2023-11-01 NOTE — Therapy (Addendum)
OUTPATIENT PHYSICAL THERAPY TREATMENT/DISCHARGE    Patient Name: Ann Taylor MRN: [id number] DOB:05-07-1981, 42 y.o., female Today's Date: 11/01/2023  PHYSICAL THERAPY DISCHARGE SUMMARY  Visits from Start of Care: 6  Current functional level related to goals / functional outcomes: See below   Remaining deficits: See below   Education / Equipment: See below   Patient agrees to discharge. Patient goals were partially met. Patient is being discharged due to maximized rehab potential.    END OF SESSION:  PT End of Session - 11/01/23 0942     Visit Number 6    Authorization Type [employer]    PT Start Time [phone]    PT Stop Time 0958    PT Time Calculation (min) 42 min    Activity Tolerance Patient tolerated treatment well;Patient limited by pain    Behavior During Therapy [facility] for tasks assessed/performed                  Past Medical History:  Diagnosis Date   Arrhythmia    Headache(784.0)    Heart murmur    UTI (lower urinary tract infection)    Past Surgical History:  Procedure Laterality Date   BREAST BIOPSY Left 04/17/2023   MM LT BREAST BX W LOC DEV 1ST LESION IMAGE BX SPEC STEREO GUIDE 04/17/2023 [facility]   CESAREAN SECTION     Patient Active Problem List   Diagnosis Date Noted   Visit for routine gyn exam 03/27/2023   STD exposure 03/27/2023   Nexplanon in place 03/27/2023   Bilateral radiating leg pain 02/12/2023   Mood disorder (HCC) 02/12/2023   Essential hypertension 07/28/2021   Family history of sickle cell anemia 07/28/2021   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   Neuropathy 07/28/2021   Other allergy, initial encounter 07/28/2021   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 07/26/2021   Migraine 07/26/2021      REFERRING PROVIDER:  Huel Cote, MD     REFERRING DIAG:  [id number] (ICD-10-CM) - Hip impingement syndrome, right      Rationale for Evaluation and Treatment: Rehabilitation  THERAPY DIAG:  Pain in right hip  Difficulty in walking,  not elsewhere classified  Pain in right ankle and joints of right foot  Muscle weakness (generalized)  Difficulty walking  ONSET DATE: chronic   SUBJECTIVE:                                                                                                                                                                                           SUBJECTIVE STATEMENT: Pt is planning for hip surgery on 12/16. My pain stays at a 7/10. Pt arrived with crutches.  It is tingling and burning. I am ready for the surgery and not ready at the same time.     PERTINENT HISTORY:  Chronic nature  PAIN:  Are you having pain? Yes: NPRS scale: 7/10 Pain location: Rt hip/groin.  Pt states the pain goes into her back. Pain description: constant, burning Aggravating factors: everything Relieving factors: meds  PRECAUTIONS:  None  RED FLAGS: None   WEIGHT BEARING RESTRICTIONS:  No  FALLS:  Has patient fallen in last 6 months? 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   PLOF:  Independent  PATIENT GOALS:  Decrease pain, prep for surgery    OBJECTIVE:  Note: Objective measures were completed at Evaluation unless otherwise noted.  POSTURE:  Eval- tendency to favor LLE    GAIT: Eval: Comments: antalgic, flexed posture   TREATMENT:       [facility] Adult PT Treatment:                                                DATE: 11/01/23 Therapeutic Exercise: Nustep x 8 mins Lvl 4 bilat LE Supine bridge 3x20 Ball squeeze with glute set x 6 but discontinued due to pain R limited AROM ER to neutral with knee extended 2 x 10 LAQ AROM 2 x 15 rep ISO LAQ with ankle pump x 10  Treatment                            12/5: Reviewed current function, pain level, and response to prior Rx.  Nustep x 5 mins Lvl 4 bilat UE/LE Supine bridge 3x10 Hooklying hip add iso with ball 2x10 with 5 sec hold LAQ AROM x 10 reps, RTB approx 4-5 reps, 2# x 10 reps  PT educated pt concerning gait with crutches.   Educated pt concerning Wb'ing restrictions post op.  PT educated pt with typical TDWB'ing restrictions post op, but instructed pt to follow MD orders after surgery.   PT demonstrated TDWB'ing restrictions and pt practiced with cuing.   Treatment                            11/30:  Nustep 7 min, lvl 4  Gait with crutches and discussed pain modifications at home and and work [employer] with cues to push through heels.  SLR with cues for quad control. x15 Gait with crutches and discussed pain modifications at home and and work  Treatment                            11/23:  Sacral mobs-PA Lt upper and mid quadrant spring mobs STM bilateral gluts Gait with crutches and discussed pain modifications at home and and work   Date: 10/11/2023: Nustep 5 min, lvl 2  LTR x30  Ball squeezes supine x30, 5 sec holds  Glute squeezes, 5 sec hold  DATE: EVAL 10/04/23  Trigger Point Dry Needling, Manual Therapy Treatment:  Initial or subsequent education regarding Trigger Point Dry Needling: Initial - education time included in Manual Did patient give consent to treatment with Trigger Point Dry Needling: Yes TPDN with skilled palpation and monitoring followed by STM to the following muscles: Rt glut med, min, piriformis  Long axis distraction- edu on self tractoin on edge of step Prone Rt SIJ spring mob Prone lumbar traction by PT     PATIENT EDUCATION:  Education details: Anatomy of condition, POC, HEP, exercise form/rationale, gait. Person educated: Patient Education method:  Explanation, Demonstration, Tactile cues, Verbal cues Education comprehension: verbalized understanding, returned demonstration, verbal cues required, tactile cues required, and needs further education  HOME EXERCISE PROGRAM: Self distraction on step   ASSESSMENT:  CLINICAL IMPRESSION: Pt enters the  clinic with crutches.  Pt continues to have significant pain and is planning for surgery on 12/16. All movement limited by R hip pain. Pt reports inability to perform functional activities outside of PT without pain radiating in inguinal crease. Pt being discharged due to limited progress due to pain and due to upcoming surgery.   REHAB POTENTIAL: Fair prehab  CLINICAL DECISION MAKING: Stable/uncomplicated  EVALUATION COMPLEXITY: Low   GOALS: Goals reviewed with patient? Yes  SHORT TERM GOALS: =LTGs  LONG TERM GOALS: Target date: POC date  Pt will verbalize decrease in overall pain levels Baseline:  Goal status: UNMET  2.  Pain management through moving- occurring today Baseline:  Goal status: MET  3.  Verbalize understanding in post op activities Baseline:  Goal status: MET  4.  Demo proper gait pattern with crutches in prep for surgery Baseline:  Goal status: MET    PLAN:  PT FREQUENCY: 1-2x/week  PT DURATION: other: POC date  PLANNED INTERVENTIONS: 97164- PT Re-evaluation, 97110-Therapeutic exercises, 97530- Therapeutic activity, 97112- Neuromuscular re-education, 97535- Self Care, 16109- Manual therapy, [phone]- Gait training, [phone]- Aquatic Therapy, Patient/Family education, Balance training, Stair training, Taping, Dry Needling, Joint mobilization, Spinal mobilization, Cryotherapy, and Moist heat.  DC VISIT

## 2023-11-04 ENCOUNTER — Encounter (HOSPITAL_BASED_OUTPATIENT_CLINIC_OR_DEPARTMENT_OTHER): Payer: Self-pay

## 2023-11-04 ENCOUNTER — Telehealth: Payer: Self-pay | Admitting: Orthopaedic Surgery

## 2023-11-04 ENCOUNTER — Other Ambulatory Visit: Payer: Self-pay | Admitting: Orthopaedic Surgery

## 2023-11-04 MED ORDER — MELOXICAM 15 MG PO TABS: 15.0000 mg | ORAL_TABLET | Freq: Every day | ORAL | 1 refills | Status: DC

## 2023-11-04 NOTE — Telephone Encounter (Signed)
Patient would like pain medication called. Ibuprofen 828m. Also the oxycodone did not work. She could not sleep taking it and was taking it with benadryl because of the allergic reaction.

## 2023-11-05 ENCOUNTER — Other Ambulatory Visit (HOSPITAL_BASED_OUTPATIENT_CLINIC_OR_DEPARTMENT_OTHER): Payer: Self-pay | Admitting: Orthopaedic Surgery

## 2023-11-05 DIAGNOSIS — M25851 Other specified joint disorders, right hip: Secondary | ICD-10-CM

## 2023-11-05 NOTE — Progress Notes (Signed)
Surgical Instructions    Your procedure is scheduled on November 10, 2023.  Report to [facility] Main Entrance "A" at 10:15 A.M., then check in with the Admitting office.  Call this number if you have problems the morning of surgery:  [phone]  If you have any questions prior to your surgery date call [phone]: Open Monday-Friday 8am-4pm If you experience any cold or flu symptoms such as cough, fever, chills, shortness of breath, etc. between now and your scheduled surgery, please notify us at the above number.     Remember:  Do not eat after midnight the night before your surgery  You may drink clear liquids until 9:15 the morning of your surgery.   Clear liquids allowed are: Water, Non-Citrus Juices (without pulp), Carbonated Beverages, Clear Tea, Black Coffee Only (NO MILK, CREAM OR POWDERED CREAMER of any kind), and Gatorade.    Take these medicines the morning of surgery with A SIP OF WATER    IF NEEDED cetirizine (ZYRTEC)  diphenhydrAMINE (BENADRYL)  oxyCODONE (ROXICODONE)   Aspirin EC Follow instructions given to you by your surgeon, call surgeon if necessary.   As of today, STOP taking any Aspirin (unless otherwise instructed by your surgeon) Aleve, Naproxen, Ibuprofen, Motrin, Advil, Goody's, BC's, all herbal medications, fish oil, and all vitamins. THIS INCLUDES YOUR lidocaine (LIDODERM) and meloxicam (MOBIC).                   Do NOT Smoke (Tobacco/Vaping) for 24 hours prior to your procedure.  If you use a CPAP at night, you may bring your mask/headgear for your overnight stay.   Contacts, glasses, piercing's, hearing aid's, dentures or partials may not be worn into surgery, please bring cases for these belongings.    For patients admitted to the hospital, discharge time will be determined by your treatment team.   Patients discharged the day of surgery will not be allowed to drive home, and someone needs to stay with them for 24 hours.  SURGICAL WAITING  ROOM VISITATION Patients having surgery or a procedure may have no more than 2 support people in the waiting area - these visitors may rotate.   Children under the age of 38 must have an adult with them who is not the patient. If the patient needs to stay at the hospital during part of their recovery, the visitor guidelines for inpatient rooms apply. Pre-op nurse will coordinate an appropriate time for 1 support person to accompany patient in pre-op.  This support person may not rotate.   Please refer to the [facility] website for the visitor guidelines for Inpatients (after your surgery is over and you are in a regular room).    Special instructions:   [place]- Preparing For Surgery  Before surgery, you can play an important role. Because skin is not sterile, your skin needs to be as free of germs as possible. You can reduce the number of germs on your skin by washing with CHG (chlorahexidine gluconate) Soap before surgery.  CHG is an antiseptic cleaner which kills germs and bonds with the skin to continue killing germs even after washing.    Oral Hygiene is also important to reduce your risk of infection.  Remember - BRUSH YOUR TEETH THE MORNING OF SURGERY WITH YOUR REGULAR TOOTHPASTE  Please do not use if you have an allergy to CHG or antibacterial soaps. If your skin becomes reddened/irritated stop using the CHG.  Do not shave (including legs and underarms) for at least  48 hours prior to first CHG shower. It is OK to shave your face.  Please follow these instructions carefully.   Shower the NIGHT BEFORE SURGERY and the MORNING OF SURGERY  If you chose to wash your hair, wash your hair first as usual with your normal shampoo.  After you shampoo, rinse your hair and body thoroughly to remove the shampoo.  Use CHG Soap as you would any other liquid soap. You can apply CHG directly to the skin and wash gently with a scrungie or a clean washcloth.   Apply the CHG Soap to your body  ONLY FROM THE NECK DOWN.  Do not use on open wounds or open sores. Avoid contact with your eyes, ears, mouth and genitals (private parts). Wash Face and genitals (private parts)  with your normal soap.   Wash thoroughly, paying special attention to the area where your surgery will be performed.  Thoroughly rinse your body with warm water from the neck down.  DO NOT shower/wash with your normal soap after using and rinsing off the CHG Soap.  Pat yourself dry with a CLEAN TOWEL.  Wear CLEAN PAJAMAS to bed the night before surgery  Place CLEAN SHEETS on your bed the night before your surgery  DO NOT SLEEP WITH PETS.   Day of Surgery: Take a shower with CHG soap. Do not wear jewelry or makeup Do not wear lotions, powders, perfumes/colognes, or deodorant. Do not shave 48 hours prior to surgery.  Men may shave face and neck. Do not bring valuables to the hospital.  [facility] is not responsible for any belongings or valuables. Do not wear nail polish, gel polish, artificial nails, or any other type of covering on natural nails (fingers and toes) If you have artificial nails or gel coating that need to be removed by a nail salon, please have this removed prior to surgery. Artificial nails or gel coating may interfere with anesthesia's ability to adequately monitor your vital signs. Wear Clean/Comfortable clothing the morning of surgery Remember to brush your teeth WITH YOUR REGULAR TOOTHPASTE.   Please read over the following fact sheets that you were given.    If you received a COVID test during your pre-op visit  it is requested that you wear a mask when out in public, stay away from anyone that may not be feeling well and notify your surgeon if you develop symptoms. If you have been in contact with anyone that has tested positive in the last 10 days please notify you surgeon.

## 2023-11-06 ENCOUNTER — Encounter (HOSPITAL_BASED_OUTPATIENT_CLINIC_OR_DEPARTMENT_OTHER): Payer: Self-pay | Admitting: Orthopaedic Surgery

## 2023-11-06 ENCOUNTER — Inpatient Hospital Stay (HOSPITAL_COMMUNITY): Admission: RE | Admit: 2023-11-06 | Payer: 59 | Source: Ambulatory Visit

## 2023-11-06 ENCOUNTER — Other Ambulatory Visit (HOSPITAL_BASED_OUTPATIENT_CLINIC_OR_DEPARTMENT_OTHER): Payer: Self-pay | Admitting: Orthopaedic Surgery

## 2023-11-06 DIAGNOSIS — M25851 Other specified joint disorders, right hip: Secondary | ICD-10-CM

## 2023-11-07 ENCOUNTER — Encounter (HOSPITAL_BASED_OUTPATIENT_CLINIC_OR_DEPARTMENT_OTHER): Payer: Self-pay | Admitting: Orthopaedic Surgery

## 2023-11-08 ENCOUNTER — Encounter (HOSPITAL_BASED_OUTPATIENT_CLINIC_OR_DEPARTMENT_OTHER): Payer: 59 | Admitting: Rehabilitative and Restorative Service Providers"

## 2023-11-10 ENCOUNTER — Ambulatory Visit (HOSPITAL_COMMUNITY): Admission: RE | Admit: 2023-11-10 | Payer: 59 | Source: Home / Self Care | Admitting: Orthopaedic Surgery

## 2023-11-10 ENCOUNTER — Encounter (HOSPITAL_COMMUNITY): Admission: RE | Payer: Self-pay | Source: Home / Self Care

## 2023-11-10 SURGERY — ARTHROSCOPY, HIP, WITH LABRUM REPAIR
Anesthesia: General | Laterality: Right

## 2023-11-11 ENCOUNTER — Telehealth: Payer: Self-pay | Admitting: Orthopaedic Surgery

## 2023-11-11 NOTE — Telephone Encounter (Signed)
Patient needs note stating she will be having surgery 11/21/23 and will need to remain out of work from 12/27 through 02/13/24 for healing and recovery. Thank you!

## 2023-11-12 ENCOUNTER — Encounter (HOSPITAL_BASED_OUTPATIENT_CLINIC_OR_DEPARTMENT_OTHER): Payer: Self-pay | Admitting: Orthopaedic Surgery

## 2023-11-12 NOTE — Telephone Encounter (Signed)
[facility] stating Dr. Serena Croissant message for 6 weeks and note was uploaded to chart

## 2023-11-12 NOTE — Telephone Encounter (Signed)
Note given to patient.

## 2023-11-13 ENCOUNTER — Telehealth: Payer: Self-pay | Admitting: Orthopaedic Surgery

## 2023-11-13 ENCOUNTER — Ambulatory Visit (HOSPITAL_BASED_OUTPATIENT_CLINIC_OR_DEPARTMENT_OTHER): Payer: 59 | Admitting: Physical Therapy

## 2023-11-13 NOTE — Telephone Encounter (Signed)
Patient would like Dr. Steward Drone to do a peer to peer with insurance so that her MRI can get done 12/20 and surgery next week.

## 2023-11-14 ENCOUNTER — Other Ambulatory Visit: Payer: 59

## 2023-11-14 ENCOUNTER — Telehealth (HOSPITAL_BASED_OUTPATIENT_CLINIC_OR_DEPARTMENT_OTHER): Payer: Self-pay | Admitting: Orthopaedic Surgery

## 2023-11-14 ENCOUNTER — Inpatient Hospital Stay: Admission: RE | Admit: 2023-11-14 | Payer: 59 | Source: Ambulatory Visit

## 2023-11-14 NOTE — Telephone Encounter (Signed)
Called and spoke with Maurine Cane on phone regarding this pt Dr. Steward Drone is going to do a P2P.

## 2023-11-14 NOTE — Telephone Encounter (Signed)
P2P

## 2023-11-17 ENCOUNTER — Encounter (HOSPITAL_BASED_OUTPATIENT_CLINIC_OR_DEPARTMENT_OTHER): Payer: Self-pay | Admitting: Orthopaedic Surgery

## 2023-11-18 ENCOUNTER — Encounter (HOSPITAL_BASED_OUTPATIENT_CLINIC_OR_DEPARTMENT_OTHER): Payer: 59

## 2023-11-20 ENCOUNTER — Encounter (HOSPITAL_BASED_OUTPATIENT_CLINIC_OR_DEPARTMENT_OTHER): Payer: Self-pay | Admitting: Orthopaedic Surgery

## 2023-11-24 ENCOUNTER — Encounter (HOSPITAL_BASED_OUTPATIENT_CLINIC_OR_DEPARTMENT_OTHER): Payer: 59 | Admitting: Orthopaedic Surgery

## 2023-11-24 ENCOUNTER — Ambulatory Visit (HOSPITAL_BASED_OUTPATIENT_CLINIC_OR_DEPARTMENT_OTHER): Payer: 59 | Admitting: Physical Therapy

## 2023-11-25 ENCOUNTER — Ambulatory Visit
Admission: RE | Admit: 2023-11-25 | Discharge: 2023-11-25 | Disposition: A | Discharge status: Home / Self Care | Payer: 59 | Source: Ambulatory Visit | Attending: Orthopaedic Surgery | Admitting: Orthopaedic Surgery

## 2023-11-25 DIAGNOSIS — M25851 Other specified joint disorders, right hip: Secondary | ICD-10-CM

## 2023-11-25 DIAGNOSIS — M25551 Pain in right hip: Secondary | ICD-10-CM | POA: Diagnosis not present

## 2023-11-25 MED ORDER — IOPAMIDOL (ISOVUE-M 200) INJECTION 41%
10.0000 mL | Freq: Once | INTRAMUSCULAR | Status: AC
  Administered 2023-11-25: 10 mL via INTRA_ARTICULAR

## 2023-11-27 ENCOUNTER — Other Ambulatory Visit: Payer: 59

## 2023-11-27 ENCOUNTER — Inpatient Hospital Stay: Admission: RE | Admit: 2023-11-27 | Payer: 59 | Source: Ambulatory Visit

## 2023-11-28 ENCOUNTER — Encounter (HOSPITAL_BASED_OUTPATIENT_CLINIC_OR_DEPARTMENT_OTHER): Payer: 59 | Admitting: Orthopaedic Surgery

## 2023-12-01 ENCOUNTER — Encounter (HOSPITAL_BASED_OUTPATIENT_CLINIC_OR_DEPARTMENT_OTHER): Payer: 59 | Admitting: Physical Therapy

## 2023-12-05 ENCOUNTER — Encounter (HOSPITAL_BASED_OUTPATIENT_CLINIC_OR_DEPARTMENT_OTHER): Payer: 59 | Admitting: Orthopaedic Surgery

## 2023-12-08 ENCOUNTER — Ambulatory Visit (HOSPITAL_BASED_OUTPATIENT_CLINIC_OR_DEPARTMENT_OTHER): Payer: 59 | Admitting: Physical Therapy

## 2023-12-10 ENCOUNTER — Ambulatory Visit (INDEPENDENT_AMBULATORY_CARE_PROVIDER_SITE_OTHER): Payer: 59 | Admitting: Obstetrics and Gynecology

## 2023-12-10 VITALS — BP 124/77 | HR 83 | Ht 65.0 in | Wt 178.5 lb

## 2023-12-10 DIAGNOSIS — Z3046 Encounter for surveillance of implantable subdermal contraceptive: Secondary | ICD-10-CM

## 2023-12-10 NOTE — Patient Instructions (Signed)
 No unprotected sex for 7-14 days before IUD placement

## 2023-12-10 NOTE — Progress Notes (Signed)
     GYNECOLOGY OFFICE PROCEDURE NOTE  Ann Taylor is a 43 y.o. [id number] here for Nexplanon  removal.  Last pap smear was on 5/24 and was normal.  No other gynecologic concerns.   Nexplanon  Removal Patient identified, informed consent performed, consent signed.   Appropriate time out taken. Nexplanon  site identified.  Area prepped in usual sterile fashon. One ml of 1% lidocaine  was used to anesthetize the area at the distal end of the implant. A small stab incision was made right beside the implant on the distal portion.  The Nexplanon  rod was grasped using hemostats and removed without difficulty.  There was minimal blood loss. There were no complications.  3 ml of 1% lidocaine  was injected around the incision for post-procedure analgesia.  Steri-strips were applied over the small incision.  A pressure bandage was applied to reduce any bruising.  The patient tolerated the procedure well and was given post procedure instructions.  Patient is planning to use mirena for contraception and cycle control.  F/u in 3-4 weeks for IUD placement, pt has hip surgery in the AM  Avie Boeck, MD, FACOG Obstetrician & Gynecologist, [facility] for [employer], [facility]

## 2023-12-10 NOTE — Progress Notes (Signed)
 Pt is in the office for nexplanon  removal, originally inserted on 08/08/2021. Pt states that she would like BC pills for cycle regulation. Last pap 03/27/2023 Pt expressed concerns with possible anxiety, declined screening and behavioral health referral.

## 2023-12-11 ENCOUNTER — Other Ambulatory Visit (HOSPITAL_BASED_OUTPATIENT_CLINIC_OR_DEPARTMENT_OTHER): Payer: Self-pay | Admitting: Orthopaedic Surgery

## 2023-12-11 ENCOUNTER — Encounter (HOSPITAL_BASED_OUTPATIENT_CLINIC_OR_DEPARTMENT_OTHER): Payer: 59

## 2023-12-11 DIAGNOSIS — S73191A Other sprain of right hip, initial encounter: Secondary | ICD-10-CM | POA: Diagnosis not present

## 2023-12-11 DIAGNOSIS — M25851 Other specified joint disorders, right hip: Secondary | ICD-10-CM | POA: Diagnosis not present

## 2023-12-11 DIAGNOSIS — M24851 Other specific joint derangements of right hip, not elsewhere classified: Secondary | ICD-10-CM | POA: Diagnosis not present

## 2023-12-11 HISTORY — PX: HIP FRACTURE SURGERY: SHX118

## 2023-12-11 MED ORDER — HYDROCODONE-ACETAMINOPHEN 5-325 MG PO TABS: 1.0000 | ORAL_TABLET | Freq: Four times a day (QID) | ORAL | 0 refills | Status: DC | PRN

## 2023-12-11 NOTE — Progress Notes (Signed)
Date of Surgery: 12/11/2023  INDICATIONS: Ann Taylor is a 43 y.o.-year-old female with right hip pincer impingment.  The risk and benefits of the procedure were discussed in detail and documented in the pre-operative evaluation.   PREOPERATIVE DIAGNOSIS: 1. Right hip pincer impingment  POSTOPERATIVE DIAGNOSIS: Same.  PROCEDURE: 1. Right hip pincer debridement  SURGEON: Ann Deeds MD  ASSISTANT: Ardeen Fillers, ATC  ANESTHESIA:  general  IV FLUIDS AND URINE: See anesthesia record.  ANTIBIOTICS: Ancef  ESTIMATED BLOOD LOSS: 5 mL.  IMPLANTS:  * No surgical log found *  DRAINS: None  CULTURES: None  COMPLICATIONS: none  DESCRIPTION OF PROCEDURE:  Cartilage Intact femoral and acetabular cartilage   Labrum Frayed appearing   Boundaries of labral tear Convention (3 o'clock anterior, 9 o'clock posterior) Anterior boundary: 3 o'clock Posterior boundary: 1 o'clock   OPERATIVE REPORT:  The patient was brought to the operating room, placed supine on the operating table, and bony prominences were padded.  The traction boots were applied with padding to ensure that safe traction could be applied through the feet.  The contralateral limb was abducted maximally and light traction was applied.  The operative leg was brought into neutral position.  The flouroscopic c-arm was brought between the legs for an AP image.  The patient was prepped and draped in a sterile fashion.  Time-out was performed and landmarks were identified. Traction was obtained and care was taken to ensure the least amount of force necessary to allow safe access to the joint of 8-39mm.  This was checked with fluoroscopy.    Next we placed an anterolateral portal under the assistance of fluoroscopy.  First, fluoroscopy was used to estimate the trajectory and starting point.  A 5mm incision with a #11 blade was made and a straight hemostat was used to dilate the portal through the appropriate tract.  We then  placed a 14-gauge hypodermic needle with careful technique to be as close to the femoral head as possible and parallel to the sorcele to ensure no iatrogenic damage to the labrum.  This released the negative pressure environment and the amount of traction was adjusted to maintain the 8-56mm of distraction.  A nitinol wire was placed through the needle and flouroscopy was used to ensure it extended to the medial wall of the acetabulum.  The Flowport from [employer] was placed over the wire and the nitinol wire was retracted to just inside the capsule during insertion of the dilator and cannula to minimize the risk of breakage. The arthroscope was placed next and we visualized the anterior triangle.     We then placed the anterior portal under direct visualization using the technique described above.  This was safely placed as well without damage to the labrum or femoral head.  We then switched our arthroscope to the anterior portal to ensure we were not through the labrum - we were safely through the capsule only.  We then proceeded with a transverse capsulotomy connecting the 2 portals in the same plane utilizing the Samurai blade from [employer].  We identified the anterior inferior iliac spine proximally, the psoas tendon medially and the rectus tendon laterally as landmarks.  We then proceeded with a diagnostic arthroscopy - the results can be found in the findings section above.    We then used the 50 degree hip specific radiofrequency device from [employer]. and a 4mm shaver to clear the superior acetabulum and expose the subspinous region.  Next we  exposed the acetabular rim leaving the chondral labral junction intact.  Working from both portals, the acetabular rim/subspinous region was reshaped with 5.5 mm bur consistent with the preoperative three-dimensional imaging.   When adequate reshaping was obtained, assessment was made of the labrum. 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   Finally, we performed a complete capsular closure with tape suture.  She was replaced in the anterior and posterior limb of the reported capsulotomy with excellent apposition. We then removed the arthroscope and closed the incisions with 3-0 nylon simple stitches.  A sterile dressing was applied..  The patient was awakened from anesthesia and transferred to PACU in stable condition. Postoperative care includes:       POSTOPERATIVE PLAN:    Weight bearing as tolerated right hip Formal physical therapy will begin this week. ASA 325 Daily for DVT prophylaxis      Ann Deeds, MD 6:04 PM

## 2023-12-12 ENCOUNTER — Telehealth: Payer: Self-pay | Admitting: Orthopaedic Surgery

## 2023-12-12 NOTE — Telephone Encounter (Signed)
Patient called advised she can not move her right leg at all. Patient said her hip is really swollen. Patient said the Hydrocodone is not working. Patient asked if she can get a little stronger pain medicine. Patient said the 5 mg is not working at all. Patient asked for a call back as soon as possible. The number to contact patient is [phone]

## 2023-12-13 ENCOUNTER — Encounter (HOSPITAL_BASED_OUTPATIENT_CLINIC_OR_DEPARTMENT_OTHER): Payer: Self-pay | Admitting: Orthopaedic Surgery

## 2023-12-13 ENCOUNTER — Other Ambulatory Visit: Payer: Self-pay | Admitting: Family Medicine

## 2023-12-15 ENCOUNTER — Encounter (HOSPITAL_BASED_OUTPATIENT_CLINIC_OR_DEPARTMENT_OTHER): Payer: Self-pay | Admitting: Physical Therapy

## 2023-12-15 ENCOUNTER — Other Ambulatory Visit (HOSPITAL_BASED_OUTPATIENT_CLINIC_OR_DEPARTMENT_OTHER): Payer: Self-pay | Admitting: Orthopaedic Surgery

## 2023-12-15 ENCOUNTER — Other Ambulatory Visit: Payer: Self-pay

## 2023-12-15 ENCOUNTER — Ambulatory Visit (HOSPITAL_BASED_OUTPATIENT_CLINIC_OR_DEPARTMENT_OTHER): Payer: Medicaid Other | Attending: Orthopaedic Surgery | Admitting: Physical Therapy

## 2023-12-15 DIAGNOSIS — M6281 Muscle weakness (generalized): Secondary | ICD-10-CM | POA: Insufficient documentation

## 2023-12-15 DIAGNOSIS — M25551 Pain in right hip: Secondary | ICD-10-CM | POA: Insufficient documentation

## 2023-12-15 DIAGNOSIS — R262 Difficulty in walking, not elsewhere classified: Secondary | ICD-10-CM | POA: Diagnosis not present

## 2023-12-15 DIAGNOSIS — M25851 Other specified joint disorders, right hip: Secondary | ICD-10-CM | POA: Diagnosis not present

## 2023-12-15 MED ORDER — IBUPROFEN 800 MG PO TABS: 800.0000 mg | ORAL_TABLET | Freq: Three times a day (TID) | ORAL | 2 refills | Status: AC

## 2023-12-15 NOTE — Therapy (Unsigned)
OUTPATIENT PHYSICAL THERAPY EVALUATION   Patient Name: Ann Taylor MRN: [id number] DOB:19-May-1981, 43 y.o., female Today's Date: 12/16/2023  END OF SESSION:  PT End of Session - 12/15/23 1103     Visit Number 1    Number of Visits 25    Date for PT Re-Evaluation 03/13/24    Authorization Type [employer]    PT Start Time 1103    PT Stop Time 1130    PT Time Calculation (min) 27 min    Activity Tolerance Patient tolerated treatment well;Patient limited by pain    Behavior During Therapy [facility] for tasks assessed/performed             Past Medical History:  Diagnosis Date   Arrhythmia    Headache(784.0)    Heart murmur    UTI (lower urinary tract infection)    Past Surgical History:  Procedure Laterality Date   BREAST BIOPSY Left 04/17/2023   MM LT BREAST BX W LOC DEV 1ST LESION IMAGE BX SPEC STEREO GUIDE 04/17/2023 [facility]   CESAREAN SECTION     Patient Active Problem List   Diagnosis Date Noted   Visit for routine gyn exam 03/27/2023   STD exposure 03/27/2023   Nexplanon in place 03/27/2023   Bilateral radiating leg pain 02/12/2023   Mood disorder (HCC) 02/12/2023   Essential hypertension 07/28/2021   Family history of sickle cell anemia 07/28/2021   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   Neuropathy 07/28/2021   Other allergy, initial encounter 07/28/2021   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 07/26/2021   Migraine 07/26/2021    REFERRING PROVIDER: Benancio Deeds, MD   REFERRING DIAG:  [phone] (ICD-10-CM) - Hip impingement syndrome, right   S/p right hip labral repair  Rationale for Evaluation and Treatment: Rehabilitation  THERAPY DIAG:  Pain in right hip  Difficulty in walking, not elsewhere classified  Muscle weakness (generalized)  Difficulty walking  ONSET DATE: DOS 1/16   SUBJECTIVE:                                                                                                                                                                                            SUBJECTIVE STATEMENT: It just really hurts. It feels like it is pinching, feels like the tape might be stuck to a stitch.   PERTINENT HISTORY:  Chronic hip pain  PAIN:  Are you having pain? Yes: NPRS scale: significant Pain location: Rt ant hip Pain description: hurts Aggravating factors: movement, pressure Relieving factors: ice  PRECAUTIONS:  None  RED FLAGS: None   WEIGHT BEARING RESTRICTIONS:  Yes Weight bearing as tolerated right hip  FALLS:  Has patient fallen in last 6 months? No  OCCUPATION:  OC support rep  PLOF:  Independent  PATIENT GOALS:  Decrease pain  OBJECTIVE:  Note: Objective measures were completed at Evaluation unless otherwise noted.  PATIENT SURVEYS:  LEFS 6/80  COGNITIVE STATUS: Within functional limits for tasks assessed   SENSATION: WFL  EDEMA:  Yes: mild at anterior hip/proximal thigh as expected post op   GAIT: Eval: bil axillary crutches, TDWB, demo ER and heel whip from toe off, lacking knee flexion in swing through   Body Part #1 Hip  PALPATION: EVAL: very guarded from PROM, able to achieve approx 35 deg flexion                                                                                                                             TREATMENT DATE:   Treatment                            1/20:  Changed bandages Passive hip flexion (to 35 deg), ER/IR in supine Supine glut set Prone position, +hamstring curl    PATIENT EDUCATION:  Education details: Anatomy of condition, POC, HEP, exercise form/rationale Person educated: Patient and sister Education method: Explanation, Demonstration, Tactile cues, Verbal cues, and Handouts Education comprehension: verbalized understanding, returned demonstration, verbal cues required, tactile cues required, and needs further education  HOME EXERCISE PROGRAM: [id number]   ASSESSMENT:  CLINICAL IMPRESSION: Patient is a 43 y.o. F who was seen today  for physical therapy evaluation and treatment for s/p right hip labral repair.    REHAB POTENTIAL: Good  CLINICAL DECISION MAKING: Stable/uncomplicated  EVALUATION COMPLEXITY: Low   GOALS: Goals reviewed with patient? Yes  SHORT TERM GOALS: Target date: 4 weeks 01/12/24  Pain free flexion to 90 Baseline: Goal status: INITIAL   2.  Demo controlled quad set with hold Baseline:  Goal status: INITIAL   3.  30s sit to stand without pain or compensation Baseline:  Goal status: INITIAL   4.  SLS 30s without compensation Baseline:  Goal status: INITIAL  LONG TERM GOALS:  Able to demo step up on 6" step with level pelvis and proper form Baseline:  Goal status: INITIAL Week 8 3/17  2.  Will tolerate at least 3 min on elliptical, demonstrating good tolerance to repetitive weight bearing motion Baseline:  Goal status: INITIAL  Week 8   3.  Demonstrate proper form in at least 10 continuous lunges without increased pain Baseline:  Goal status: INITIAL  Week 12 4/14  4.  Demonstrate gentle, double and single foot plyometric motions with good proximal form Baseline:  Goal status: INITIAL Week 12     PLAN:  PT FREQUENCY: 1-2x/week  PT DURATION: POC dates  PLANNED INTERVENTIONS: 97164- PT Re-evaluation, 97110-Therapeutic exercises, 97530- Therapeutic activity, [id number]- Neuromuscular re-education, 97535- Self Care, 54098- Manual therapy, [phone]- Gait training, [phone]- Aquatic Therapy, Patient/Family education, Balance  training, Stair training, Taping, Dry Needling, Joint mobilization, Spinal mobilization, Scar mobilization, Cryotherapy, and Moist heat.  PLAN FOR NEXT SESSION: per protocol   [name] C. Cindia Hustead PT, DPT 12/16/23 10:19 AM

## 2023-12-18 ENCOUNTER — Encounter (HOSPITAL_BASED_OUTPATIENT_CLINIC_OR_DEPARTMENT_OTHER): Payer: 59

## 2023-12-19 ENCOUNTER — Other Ambulatory Visit (HOSPITAL_BASED_OUTPATIENT_CLINIC_OR_DEPARTMENT_OTHER): Payer: Self-pay | Admitting: Orthopaedic Surgery

## 2023-12-19 MED ORDER — HYDROCODONE-ACETAMINOPHEN 5-325 MG PO TABS: 1.0000 | ORAL_TABLET | Freq: Four times a day (QID) | ORAL | 0 refills | Status: DC | PRN

## 2023-12-23 ENCOUNTER — Ambulatory Visit (HOSPITAL_BASED_OUTPATIENT_CLINIC_OR_DEPARTMENT_OTHER): Payer: 59 | Admitting: Physical Therapy

## 2023-12-24 ENCOUNTER — Encounter (HOSPITAL_BASED_OUTPATIENT_CLINIC_OR_DEPARTMENT_OTHER): Payer: 59 | Admitting: Physical Therapy

## 2023-12-25 ENCOUNTER — Ambulatory Visit (INDEPENDENT_AMBULATORY_CARE_PROVIDER_SITE_OTHER): Payer: 59 | Admitting: Orthopaedic Surgery

## 2023-12-25 DIAGNOSIS — M25851 Other specified joint disorders, right hip: Secondary | ICD-10-CM

## 2023-12-25 NOTE — Progress Notes (Signed)
Post Operative Evaluation    Procedure/Date of Surgery: Status post right hip pincer debridement 1/16  Interval History:   Presents 2 weeks status post right hip arthroscopy with pincer debridement.  Overall she is doing well with physical therapy.  She has progressed her weightbearing.  She is utilizing 2 crutches.   PMH/PSH/Family History/Social History/Meds/Allergies:    Past Medical History:  Diagnosis Date   Arrhythmia    Headache(784.0)    Heart murmur    UTI (lower urinary tract infection)    Past Surgical History:  Procedure Laterality Date   BREAST BIOPSY Left 04/17/2023   MM LT BREAST BX W LOC DEV 1ST LESION IMAGE BX SPEC STEREO GUIDE 04/17/2023 [facility]   CESAREAN SECTION     Social History   Socioeconomic History   Marital status: Single    Spouse name: Not on file   Number of children: Not on file   Years of education: Not on file   Highest education level: Not on file  Occupational History   Not on file  Tobacco Use   Smoking status: Never   Smokeless tobacco: Never  Vaping Use   Vaping status: Never Used  Substance and Sexual Activity   Alcohol use: No   Drug use: No   Sexual activity: Yes    Birth control/protection: None  Other Topics Concern   Not on file  Social History Narrative   Not on file   Social Drivers of Health   Financial Resource Strain: At Risk (04/25/2023)   Received from [place], [place]   [employer]    Financial Resource Strain: 2  Food Insecurity: Not on file (04/25/2023)  Transportation Needs: Not at Risk (04/25/2023)   Received from [place], [employer] Needs    Transportation: 1  Physical Activity: Not on File (03/10/2023)   Received from [place], [place]   Physical Activity    Physical Activity: 0  Stress: Not on File (03/10/2023)   Received from [place], [place]   Stress    Stress: 0  Social Connections: Not on File (07/29/2023)   Received from [employer]    Social Connections    Connectedness: 0   Family History  Problem Relation Age of Onset   Alcohol abuse Neg Hx    Arthritis Neg Hx    Asthma Neg Hx    Birth defects Neg Hx    Cancer Neg Hx    COPD Neg Hx    Depression Neg Hx    Diabetes Neg Hx    Drug abuse Neg Hx    Early death Neg Hx    Hearing loss Neg Hx    Heart disease Neg Hx    Hyperlipidemia Neg Hx    Hypertension Neg Hx    Kidney disease Neg Hx    Learning disabilities Neg Hx    Mental illness Neg Hx    Mental retardation Neg Hx    Miscarriages / Stillbirths Neg Hx    Stroke Neg Hx    Vision loss Neg Hx    Allergies  Allergen Reactions   Oxycodone Hives and Itching   Shellfish Allergy Anaphylaxis   Shellfish-Derived Products Anaphylaxis   Grass Pollen(K-O-R-T-Swt Vern) Hives   Codone [Hydrocodone] Itching   Codeine Hives, Itching and Rash   Tramadol Hives, Itching and Anxiety  Current Outpatient Medications  Medication Sig Dispense Refill   meloxicam (MOBIC) 15 MG tablet Take 1 tablet (15 mg total) by mouth daily. (Patient not taking: Reported on 12/10/2023) 30 tablet 1   aspirin EC 325 MG tablet Take 1 tablet (325 mg total) by mouth daily. (Patient not taking: Reported on 12/10/2023) 14 tablet 0   cetirizine (ZYRTEC) 10 MG tablet Take 10 mg by mouth daily. Additional if needed for hives     diphenhydrAMINE (BENADRYL) 25 mg capsule Take 25 mg by mouth every 6 (six) hours as needed (When take Oxycodone). (Patient not taking: Reported on 12/10/2023)     gabapentin (NEURONTIN) 300 MG capsule Take 300 mg by mouth daily.     HYDROcodone-acetaminophen (NORCO/VICODIN) 5-325 MG tablet Take 1 tablet by mouth every 6 (six) hours as needed for moderate pain (pain score 4-6). 30 tablet 0   HYDROcodone-acetaminophen (NORCO/VICODIN) 5-325 MG tablet Take 1-2 tablets by mouth every 6 (six) hours as needed for moderate pain (pain score 4-6). 20 tablet 0   ibuprofen (ADVIL) 800 MG tablet Take 800 mg by mouth every 8 (eight) hours as  needed for moderate pain (pain score 4-6) or mild pain (pain score 1-3). (Patient not taking: Reported on 12/10/2023)     ibuprofen (ADVIL) 800 MG tablet Take 1 tablet (800 mg total) by mouth every 8 (eight) hours. Please take with food, please alternate with acetaminophen 30 tablet 2   lidocaine (LIDODERM) 5 % Place 1 patch onto the skin daily. Remove & Discard patch within 12 hours or as directed by MD (Patient not taking: Reported on 11/03/2023) 30 patch 0   oxyCODONE (ROXICODONE) 5 MG immediate release tablet Take 1 tablet (5 mg total) by mouth every 4 (four) hours as needed for severe pain or breakthrough pain. (Patient not taking: Reported on 12/10/2023) 10 tablet 0   triamterene-hydrochlorothiazide (DYAZIDE) 37.5-25 MG capsule Take 1 each (1 capsule total) by mouth daily. 15 capsule 0   No current facility-administered medications for this visit.   No results found.  Review of Systems:   A ROS was performed including pertinent positives and negatives as documented in the HPI.   Musculoskeletal Exam:    There were no vitals taken for this visit.  Right hip incisions are well-appearing without erythema or drainage.  Distal neurosensory exam is intact.  30 degrees internal/external rotation of the right hip without significant pain.  Walks with a mildly antalgic gait  Imaging:      I personally reviewed and interpreted the radiographs.   Assessment:   2 weeks status post right hip arthroscopy with pincer debridement overall doing well.  This time should continue to work on fully weightbearing and discontinue her crutches.  She will continue to work with physical therapy.  I will plan to see her back in 4 weeks for reassessment  Plan :    -Return to clinic 4 weeks for reassessment      I personally saw and evaluated the patient, and participated in the management and treatment plan.  Huel Cote, MD Attending Physician, Orthopedic Surgery  This document was dictated  using Dragon voice recognition software. A reasonable attempt at proof reading has been made to minimize errors.

## 2023-12-27 ENCOUNTER — Encounter (HOSPITAL_BASED_OUTPATIENT_CLINIC_OR_DEPARTMENT_OTHER): Payer: Self-pay | Admitting: Physical Therapy

## 2023-12-27 ENCOUNTER — Ambulatory Visit (HOSPITAL_BASED_OUTPATIENT_CLINIC_OR_DEPARTMENT_OTHER): Payer: 59 | Attending: Orthopaedic Surgery | Admitting: Physical Therapy

## 2023-12-27 DIAGNOSIS — M25551 Pain in right hip: Secondary | ICD-10-CM | POA: Insufficient documentation

## 2023-12-27 DIAGNOSIS — R262 Difficulty in walking, not elsewhere classified: Secondary | ICD-10-CM | POA: Insufficient documentation

## 2023-12-27 DIAGNOSIS — M6281 Muscle weakness (generalized): Secondary | ICD-10-CM | POA: Insufficient documentation

## 2023-12-27 NOTE — Therapy (Signed)
OUTPATIENT PHYSICAL THERAPY EVALUATION   Patient Name: Ann Taylor MRN: [id number] DOB:[birth year], 43 y.o., female Today's Date: 12/27/2023  END OF SESSION:  PT End of Session - 12/27/23 1147     Visit Number 2    Number of Visits 25    Date for PT Re-Evaluation 03/13/24    Authorization Type [employer] & [facility]    PT Start Time 1142    PT Stop Time 1212    PT Time Calculation (min) 30 min    Activity Tolerance Patient tolerated treatment well    Behavior During Therapy [facility] for tasks assessed/performed              Past Medical History:  Diagnosis Date   Arrhythmia    Headache(784.0)    Heart murmur    UTI (lower urinary tract infection)    Past Surgical History:  Procedure Laterality Date   BREAST BIOPSY Left 04/17/2023   MM LT BREAST BX W LOC DEV 1ST LESION IMAGE BX SPEC STEREO GUIDE 04/17/2023 [facility]   CESAREAN SECTION     Patient Active Problem List   Diagnosis Date Noted   Visit for routine gyn exam 03/27/2023   STD exposure 03/27/2023   Nexplanon in place 03/27/2023   Bilateral radiating leg pain 02/12/2023   Mood disorder (HCC) 02/12/2023   Essential hypertension 07/28/2021   Family history of sickle cell anemia 07/28/2021   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   Neuropathy 07/28/2021   Other allergy, initial encounter 07/28/2021   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 07/26/2021   Migraine 07/26/2021    REFERRING PROVIDER: Benancio Deeds, MD   REFERRING DIAG:  [phone] (ICD-10-CM) - Hip impingement syndrome, right   S/p right hip labral repair  Rationale for Evaluation and Treatment: Rehabilitation  THERAPY DIAG:  Pain in right hip  Difficulty in walking, not elsewhere classified  Muscle weakness (generalized)  Difficulty walking  ONSET DATE: DOS 1/16   SUBJECTIVE:                                                                                                                                                                                            SUBJECTIVE STATEMENT: It just really hurts. I did not take my pain meds this morning, I had to drive here. I have been laying in bed and raising bending my knee/ hip a lot and then realize its breaking precautions. It hurts when I have to put it back down. I am having a hard time sitting as it hurts.    PERTINENT HISTORY:  Chronic hip pain  PAIN:  Are you having pain? Yes: NPRS scale: significant  Pain location: Rt ant hip Pain description: hurts Aggravating factors: movement, pressure Relieving factors: ice  PRECAUTIONS:  None  RED FLAGS: None   WEIGHT BEARING RESTRICTIONS:  Yes Weight bearing as tolerated right hip  FALLS:  Has patient fallen in last 6 months? No  OCCUPATION:  OC support rep  PLOF:  Independent  PATIENT GOALS:  Decrease pain  OBJECTIVE:  Note: Objective measures were completed at Evaluation unless otherwise noted.  PATIENT SURVEYS:  LEFS 6/80  COGNITIVE STATUS: Within functional limits for tasks assessed   SENSATION: WFL  EDEMA:  Yes: mild at anterior hip/proximal thigh as expected post op   GAIT: Eval: bil axillary crutches, TDWB, demo ER and heel whip from toe off, lacking knee flexion in swing through   Body Part #1 Hip  PALPATION: EVAL: very guarded from PROM, able to achieve approx 35 deg flexion                                                                                                                             TREATMENT DATE:   Treatment                            02/01: - quad sets x20 with 5 sec holds.  - ankle pumps  - heel slides to tolerance with assistance  - Redressed wound and provided extra.  - prone position, HS curls -  glute squeezes in standing.    Treatment                            1/20:  Changed bandages Passive hip flexion (to 35 deg), ER/IR in supine Supine glut set Prone position, +hamstring curl    PATIENT EDUCATION:  Education details: Anatomy of condition, POC, HEP, exercise  form/rationale Person educated: Patient and sister Education method: Explanation, Demonstration, Tactile cues, Verbal cues, and Handouts Education comprehension: verbalized understanding, returned demonstration, verbal cues required, tactile cues required, and needs further education  HOME EXERCISE PROGRAM: [email or web address]   ASSESSMENT:  CLINICAL IMPRESSION: Pt arrives late to appt. She drove here and reports a lot of pain currently. She did not take her meds this morning, so is unable to tolerate a lot of exercises/ movement today. She requires cues to stay on task. Increased time needed with transitions due to pain. Pt will continue to benefit from skilled PT to address continued deficits.     REHAB POTENTIAL: Good  CLINICAL DECISION MAKING: Stable/uncomplicated  EVALUATION COMPLEXITY: Low   GOALS: Goals reviewed with patient? Yes  SHORT TERM GOALS: Target date: 4 weeks 01/12/24  Pain free flexion to 90 Baseline: Goal status: INITIAL   2.  Demo controlled quad set with hold Baseline:  Goal status: INITIAL   3.  30s sit to stand without pain or compensation Baseline:  Goal status: INITIAL   4.  SLS 30s without compensation Baseline:  Goal status: INITIAL  LONG TERM GOALS:  Able to demo step up on 6" step with level pelvis and proper form Baseline:  Goal status: INITIAL Week 8 3/17  2.  Will tolerate at least 3 min on elliptical, demonstrating good tolerance to repetitive weight bearing motion Baseline:  Goal status: INITIAL  Week 8   3.  Demonstrate proper form in at least 10 continuous lunges without increased pain Baseline:  Goal status: INITIAL  Week 12 4/14  4.  Demonstrate gentle, double and single foot plyometric motions with good proximal form Baseline:  Goal status: INITIAL Week 12     PLAN:  PT FREQUENCY: 1-2x/week  PT DURATION: POC dates  PLANNED INTERVENTIONS: 97164- PT Re-evaluation, 97110-Therapeutic exercises, 97530- Therapeutic activity,  [id number]- Neuromuscular re-education, 97535- Self Care, 29562- Manual therapy, [phone]- Gait training, [phone]- Aquatic Therapy, Patient/Family education, Balance training, Stair training, Taping, Dry Needling, Joint mobilization, Spinal mobilization, Scar mobilization, Cryotherapy, and Moist heat.  PLAN FOR NEXT SESSION: per protocol  Royal Hawthorn PT, DPT 12/27/23  12:14 PM

## 2023-12-30 ENCOUNTER — Encounter (HOSPITAL_BASED_OUTPATIENT_CLINIC_OR_DEPARTMENT_OTHER): Payer: Self-pay | Admitting: Physical Therapy

## 2023-12-30 ENCOUNTER — Ambulatory Visit (HOSPITAL_BASED_OUTPATIENT_CLINIC_OR_DEPARTMENT_OTHER): Payer: 59 | Admitting: Physical Therapy

## 2023-12-30 DIAGNOSIS — M25551 Pain in right hip: Secondary | ICD-10-CM | POA: Diagnosis not present

## 2023-12-30 DIAGNOSIS — M6281 Muscle weakness (generalized): Secondary | ICD-10-CM | POA: Diagnosis not present

## 2023-12-30 DIAGNOSIS — R262 Difficulty in walking, not elsewhere classified: Secondary | ICD-10-CM | POA: Diagnosis not present

## 2023-12-30 NOTE — Therapy (Signed)
 OUTPATIENT PHYSICAL THERAPY EVALUATION   Patient Name: Ann Taylor MRN: [id number] DOB:09/23/1981, 43 y.o., female Today's Date: 12/30/2023  END OF SESSION:  PT End of Session - 12/30/23 1028     Visit Number 3    Number of Visits 25    Date for PT Re-Evaluation 03/13/24    Authorization Type [employer] & [employer]    PT Start Time 1028   arrived late   PT Stop Time 1056    PT Time Calculation (min) 28 min    Activity Tolerance Patient tolerated treatment well    Behavior During Therapy [facility] for tasks assessed/performed              Past Medical History:  Diagnosis Date   Arrhythmia    Headache(784.0)    Heart murmur    UTI (lower urinary tract infection)    Past Surgical History:  Procedure Laterality Date   BREAST BIOPSY Left 04/17/2023   MM LT BREAST BX W LOC DEV 1ST LESION IMAGE BX SPEC STEREO GUIDE 04/17/2023 [facility]   CESAREAN SECTION     Patient Active Problem List   Diagnosis Date Noted   Visit for routine gyn exam 03/27/2023   STD exposure 03/27/2023   Nexplanon  in place 03/27/2023   Bilateral radiating leg pain 02/12/2023   Mood disorder (HCC) 02/12/2023   Essential hypertension 07/28/2021   Family history of sickle cell anemia 07/28/2021   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   Neuropathy 07/28/2021   Other allergy, initial encounter 07/28/2021   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 07/26/2021   Migraine 07/26/2021    REFERRING PROVIDER: Elspeth LITTIE Parker, MD   REFERRING DIAG:  [phone] (ICD-10-CM) - Hip impingement syndrome, right   S/p right hip labral repair  Rationale for Evaluation and Treatment: Rehabilitation  THERAPY DIAG:  Pain in right hip  Difficulty in walking, not elsewhere classified  Muscle weakness (generalized)  Difficulty walking  ONSET DATE: DOS 1/16   SUBJECTIVE:                                                                                                                                                                                            SUBJECTIVE STATEMENT: It really hurts to sit up. The walking is not the issue. Reports occasional N/T down leg and shooting/burning on proximal ant-lat thigh.   PERTINENT HISTORY:  Chronic hip pain  PAIN:  Are you having pain? Yes: NPRS scale: significant Pain location: Rt ant hip Pain description: hurts Aggravating factors: movement, pressure Relieving factors: ice  PRECAUTIONS:  None  RED FLAGS: None   WEIGHT BEARING RESTRICTIONS:  Yes Weight bearing as  tolerated right hip  FALLS:  Has patient fallen in last 6 months? No  OCCUPATION:  OC support rep  PLOF:  Independent  PATIENT GOALS:  Decrease pain  OBJECTIVE:  Note: Objective measures were completed at Evaluation unless otherwise noted.  PATIENT SURVEYS:  LEFS 6/80  COGNITIVE STATUS: Within functional limits for tasks assessed   SENSATION: WFL  EDEMA:  Yes: mild at anterior hip/proximal thigh as expected post op   GAIT: Eval: bil axillary crutches, TDWB, demo ER and heel whip from toe off, lacking knee flexion in swing through   Body Part #1 Hip  PALPATION: EVAL: very guarded from PROM, able to achieve approx 35 deg flexion                                                                                                                             TREATMENT DATE:   Treatment                            2/4:  Discussed continued use of crutches with normal gait pattern Roller Rt hip and thigh Edu on self STM with tennis ball to pectineus/hip flexor region Supine mini bridge with legs over bolster   Treatment                            02/01: - quad sets x20 with 5 sec holds.  - ankle pumps  - heel slides to tolerance with assistance  - Redressed wound and provided extra.  - prone position, HS curls -  glute squeezes in standing.    Treatment                            1/20:  Changed bandages Passive hip flexion (to 35 deg), ER/IR in supine Supine glut set Prone  position, +hamstring curl    PATIENT EDUCATION:  Education details: Anatomy of condition, POC, HEP, exercise form/rationale Person educated: Patient and sister Education method: Explanation, Demonstration, Tactile cues, Verbal cues, and Handouts Education comprehension: verbalized understanding, returned demonstration, verbal cues required, tactile cues required, and needs further education  HOME EXERCISE PROGRAM: [id number]   ASSESSMENT:  CLINICAL IMPRESSION: Late arrival so appt time was shorter today. Pt with significant pain, s/s consistent with expected MSK discomfort post op due to necessary surgical positioning. Minimal tolerance to STM. Significant spasm in pectineus and educated on self STM for improved tolerance. Asked that she utilize bilat axillary crutches full-time with normalized gait pattern to decrease pain.     REHAB POTENTIAL: Good  CLINICAL DECISION MAKING: Stable/uncomplicated  EVALUATION COMPLEXITY: Low   GOALS: Goals reviewed with patient? Yes  SHORT TERM GOALS: Target date: 4 weeks 01/12/24  Pain free flexion to 90 Baseline: Goal status: INITIAL   2.  Demo controlled quad set with hold Baseline:  Goal status: INITIAL  3.  30s sit to stand without pain or compensation Baseline:  Goal status: INITIAL   4.  SLS 30s without compensation Baseline:  Goal status: INITIAL  LONG TERM GOALS:  Able to demo step up on 6 step with level pelvis and proper form Baseline:  Goal status: INITIAL Week 8 3/17  2.  Will tolerate at least 3 min on elliptical, demonstrating good tolerance to repetitive weight bearing motion Baseline:  Goal status: INITIAL  Week 8   3.  Demonstrate proper form in at least 10 continuous lunges without increased pain Baseline:  Goal status: INITIAL  Week 12 4/14  4.  Demonstrate gentle, double and single foot plyometric motions with good proximal form Baseline:  Goal status: INITIAL Week 12     PLAN:  PT FREQUENCY:  1-2x/week  PT DURATION: POC dates  PLANNED INTERVENTIONS: 97164- PT Re-evaluation, 97110-Therapeutic exercises, 97530- Therapeutic activity, [id number]- Neuromuscular re-education, 97535- Self Care, [id number]- Manual therapy, [phone]- Gait training, [phone]- Aquatic Therapy, Patient/Family education, Balance training, Stair training, Taping, Dry Needling, Joint mobilization, Spinal mobilization, Scar mobilization, Cryotherapy, and Moist heat.  PLAN FOR NEXT SESSION: per protocol Gino Garrabrant C. Beola Vasallo PT, DPT 12/30/23 1:27 PM

## 2024-01-02 ENCOUNTER — Encounter (HOSPITAL_BASED_OUTPATIENT_CLINIC_OR_DEPARTMENT_OTHER): Payer: Self-pay

## 2024-01-02 ENCOUNTER — Ambulatory Visit (HOSPITAL_BASED_OUTPATIENT_CLINIC_OR_DEPARTMENT_OTHER): Payer: 59

## 2024-01-02 DIAGNOSIS — R262 Difficulty in walking, not elsewhere classified: Secondary | ICD-10-CM | POA: Diagnosis not present

## 2024-01-02 DIAGNOSIS — M25551 Pain in right hip: Secondary | ICD-10-CM

## 2024-01-02 DIAGNOSIS — M6281 Muscle weakness (generalized): Secondary | ICD-10-CM | POA: Diagnosis not present

## 2024-01-02 NOTE — Therapy (Signed)
 OUTPATIENT PHYSICAL THERAPY TREATMENT   Patient Name: Ann Taylor MRN: [id number] DOB:1981-06-21, 43 y.o., female Today's Date: 01/02/2024  END OF SESSION:  PT End of Session - 01/02/24 1150     Visit Number 4    Number of Visits 25    Date for PT Re-Evaluation 03/13/24    Authorization Type [employer]    PT Start Time 1148    PT Stop Time 1228    PT Time Calculation (min) 40 min    Activity Tolerance Patient tolerated treatment well    Behavior During Therapy [facility] for tasks assessed/performed               Past Medical History:  Diagnosis Date   Arrhythmia    Headache(784.0)    Heart murmur    UTI (lower urinary tract infection)    Past Surgical History:  Procedure Laterality Date   BREAST BIOPSY Left 04/17/2023   MM LT BREAST BX W LOC DEV 1ST LESION IMAGE BX SPEC STEREO GUIDE 04/17/2023 [facility]   CESAREAN SECTION     Patient Active Problem List   Diagnosis Date Noted   Visit for routine gyn exam 03/27/2023   STD exposure 03/27/2023   Nexplanon  in place 03/27/2023   Bilateral radiating leg pain 02/12/2023   Mood disorder (HCC) 02/12/2023   Essential hypertension 07/28/2021   Family history of sickle cell anemia 07/28/2021   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   Neuropathy 07/28/2021   Other allergy, initial encounter 07/28/2021   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 07/26/2021   Migraine 07/26/2021    REFERRING PROVIDER: Elspeth LITTIE Parker, MD   REFERRING DIAG:  [phone] (ICD-10-CM) - Hip impingement syndrome, right   S/p right hip labral repair  Rationale for Evaluation and Treatment: Rehabilitation  THERAPY DIAG:  Difficulty in walking, not elsewhere classified  Pain in right hip  ONSET DATE: DOS 1/16   SUBJECTIVE:                                                                                                                                                                                           SUBJECTIVE STATEMENT: Pt reports when she was  getting into car lifting her R leg to get in the car, her hip popped and she had increased pain.  Continues to have increased pain with sitting and pressure on R hip. Has been using ice. I may be overdoing it at home. Arrives with bilateral crutches.   PERTINENT HISTORY:  Chronic hip pain  PAIN:  Are you having pain? Yes: NPRS scale: significant Pain location: Rt ant hip Pain description: hurts Aggravating factors: movement, pressure  Relieving factors: ice  PRECAUTIONS:  None  RED FLAGS: None   WEIGHT BEARING RESTRICTIONS:  Yes Weight bearing as tolerated right hip  FALLS:  Has patient fallen in last 6 months? No  OCCUPATION:  OC support rep  PLOF:  Independent  PATIENT GOALS:  Decrease pain  OBJECTIVE:  Note: Objective measures were completed at Evaluation unless otherwise noted.  PATIENT SURVEYS:  LEFS 6/80  COGNITIVE STATUS: Within functional limits for tasks assessed   SENSATION: WFL  EDEMA:  Yes: mild at anterior hip/proximal thigh as expected post op   GAIT: Eval: bil axillary crutches, TDWB, demo ER and heel whip from toe off, lacking knee flexion in swing through   Body Part #1 Hip  PALPATION: EVAL: very guarded from PROM, able to achieve approx 35 deg flexion                                                                                                                             TREATMENT DATE:   Treatment                            2/7:  R hip PROM (gentle) Glute sets 5 x20 Quads sets 5 x20 Quadruped shifting ant/post and lateral  Prone HSC 2x10 Prone IR/ER 2x10 LAQ 5 2x10   Treatment                            2/4:  Discussed continued use of crutches with normal gait pattern Roller Rt hip and thigh Edu on self STM with tennis ball to pectineus/hip flexor region Supine mini bridge with legs over bolster   Treatment                            02/01: - quad sets x20 with 5 sec holds.  - ankle pumps  - heel slides to  tolerance with assistance  - Redressed wound and provided extra.  - prone position, HS curls -  glute squeezes in standing.    Treatment                            1/20:  Changed bandages Passive hip flexion (to 35 deg), ER/IR in supine Supine glut set Prone position, +hamstring curl    PATIENT EDUCATION:  Education details: Anatomy of condition, POC, HEP, exercise form/rationale Person educated: Patient and sister Education method: Explanation, Demonstration, Tactile cues, Verbal cues, and Handouts Education comprehension: verbalized understanding, returned demonstration, verbal cues required, tactile cues required, and needs further education  HOME EXERCISE PROGRAM: [id number]   ASSESSMENT:  CLINICAL IMPRESSION: Good tolerance for PROM within limited range. She does tend to guard at times, but can maintain relaxed state for prolonged periods. Good tolerance for quadruped and prone positioning.  Encouragement provided with exercises.  Reviewed precautions and restrictions with pt. Advised she continue with bilateral crutch use until cleared by PT. Advised against driving at this time. Instructed her to decrease her activity level and increase use of ice at home.      REHAB POTENTIAL: Good  CLINICAL DECISION MAKING: Stable/uncomplicated  EVALUATION COMPLEXITY: Low   GOALS: Goals reviewed with patient? Yes  SHORT TERM GOALS: Target date: 4 weeks 01/12/24  Pain free flexion to 90 Baseline: Goal status: IN PROGRESS   2.  Demo controlled quad set with hold Baseline:  Goal status: MET 2/7  3.  30s sit to stand without pain or compensation Baseline:  Goal status: INITIAL   4.  SLS 30s without compensation Baseline:  Goal status: INITIAL  LONG TERM GOALS:  Able to demo step up on 6 step with level pelvis and proper form Baseline:  Goal status: INITIAL Week 8 3/17  2.  Will tolerate at least 3 min on elliptical, demonstrating good tolerance to repetitive weight  bearing motion Baseline:  Goal status: INITIAL  Week 8   3.  Demonstrate proper form in at least 10 continuous lunges without increased pain Baseline:  Goal status: INITIAL  Week 12 4/14  4.  Demonstrate gentle, double and single foot plyometric motions with good proximal form Baseline:  Goal status: INITIAL Week 12     PLAN:  PT FREQUENCY: 1-2x/week  PT DURATION: POC dates  PLANNED INTERVENTIONS: 97164- PT Re-evaluation, 97110-Therapeutic exercises, 97530- Therapeutic activity, [id number]- Neuromuscular re-education, 97535- Self Care, [id number]- Manual therapy, [phone]- Gait training, [phone]- Aquatic Therapy, Patient/Family education, Balance training, Stair training, Taping, Dry Needling, Joint mobilization, Spinal mobilization, Scar mobilization, Cryotherapy, and Moist heat.  PLAN FOR NEXT SESSION: per protocol  Asberry BRAVO Anivea Velasques, PTA 01/02/2024, 2:22 PM

## 2024-01-05 ENCOUNTER — Telehealth: Payer: Self-pay

## 2024-01-05 NOTE — Telephone Encounter (Signed)
 Denece Finger, NP with [facility] called stating that patient called them concerning Rx refill for pain medication.  CB#  for patient   [phone].  Please advise.  Thank you.

## 2024-01-06 ENCOUNTER — Ambulatory Visit (HOSPITAL_BASED_OUTPATIENT_CLINIC_OR_DEPARTMENT_OTHER): Payer: 59 | Admitting: Physical Therapy

## 2024-01-06 ENCOUNTER — Encounter (HOSPITAL_BASED_OUTPATIENT_CLINIC_OR_DEPARTMENT_OTHER): Payer: Self-pay | Admitting: Orthopaedic Surgery

## 2024-01-06 DIAGNOSIS — G8929 Other chronic pain: Secondary | ICD-10-CM | POA: Diagnosis not present

## 2024-01-06 DIAGNOSIS — M25551 Pain in right hip: Secondary | ICD-10-CM | POA: Diagnosis not present

## 2024-01-07 ENCOUNTER — Telehealth (HOSPITAL_BASED_OUTPATIENT_CLINIC_OR_DEPARTMENT_OTHER): Payer: Self-pay | Admitting: Orthopaedic Surgery

## 2024-01-07 NOTE — Telephone Encounter (Signed)
Postop pain management

## 2024-01-08 ENCOUNTER — Ambulatory Visit: Payer: Medicaid Other | Admitting: Obstetrics and Gynecology

## 2024-01-09 ENCOUNTER — Ambulatory Visit (HOSPITAL_BASED_OUTPATIENT_CLINIC_OR_DEPARTMENT_OTHER): Payer: 59

## 2024-01-09 ENCOUNTER — Encounter (HOSPITAL_BASED_OUTPATIENT_CLINIC_OR_DEPARTMENT_OTHER): Payer: Self-pay

## 2024-01-09 DIAGNOSIS — R262 Difficulty in walking, not elsewhere classified: Secondary | ICD-10-CM

## 2024-01-09 DIAGNOSIS — M25551 Pain in right hip: Secondary | ICD-10-CM | POA: Diagnosis not present

## 2024-01-09 DIAGNOSIS — M6281 Muscle weakness (generalized): Secondary | ICD-10-CM | POA: Diagnosis not present

## 2024-01-09 NOTE — Therapy (Signed)
OUTPATIENT PHYSICAL THERAPY TREATMENT   Patient Name: Ann Taylor MRN: [id number] DOB:[birth year], 43 y.o., female Today's Date: 01/09/2024  END OF SESSION:  PT End of Session - 01/09/24 1337     Visit Number 5    Number of Visits 25    Date for PT Re-Evaluation 03/13/24    Authorization Type [employer]    PT Start Time 1200   arrived late   PT Stop Time 1230    PT Time Calculation (min) 30 min    Activity Tolerance Patient tolerated treatment well    Behavior During Therapy [facility] for tasks assessed/performed                Past Medical History:  Diagnosis Date   Arrhythmia    Headache(784.0)    Heart murmur    UTI (lower urinary tract infection)    Past Surgical History:  Procedure Laterality Date   BREAST BIOPSY Left 04/17/2023   MM LT BREAST BX W LOC DEV 1ST LESION IMAGE BX SPEC STEREO GUIDE 04/17/2023 [facility]   CESAREAN SECTION     Patient Active Problem List   Diagnosis Date Noted   Visit for routine gyn exam 03/27/2023   STD exposure 03/27/2023   Nexplanon in place 03/27/2023   Bilateral radiating leg pain 02/12/2023   Mood disorder (HCC) 02/12/2023   Essential hypertension 07/28/2021   Family history of sickle cell anemia 07/28/2021   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   Neuropathy 07/28/2021   Other allergy, initial encounter 07/28/2021   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 07/26/2021   Migraine 07/26/2021    REFERRING PROVIDER: Benancio Deeds, MD   REFERRING DIAG:  [phone] (ICD-10-CM) - Hip impingement syndrome, right   S/p right hip labral repair  Rationale for Evaluation and Treatment: Rehabilitation  THERAPY DIAG:  Pain in right hip  Difficulty in walking, not elsewhere classified  ONSET DATE: DOS 1/16   SUBJECTIVE:                                                                                                                                                                                           SUBJECTIVE STATEMENT:  Pt 4  weeks s/p. She arrives with bilateral  crutches and an OTC hip spica brace   Pt reports when she was getting into car lifting her R leg to get in the car, her hip "popped" and she had increased pain.  Continues to have increased pain with sitting and pressure on R hip. Has been using ice. "I may be overdoing it at home." Arrives with bilateral crutches.   PERTINENT HISTORY:  Chronic hip pain  PAIN:  Are you having pain? Yes: NPRS scale: significant Pain location: Rt ant hip Pain description: hurts Aggravating factors: movement, pressure Relieving factors: ice  PRECAUTIONS:  None  RED FLAGS: None   WEIGHT BEARING RESTRICTIONS:  Yes Weight bearing as tolerated right hip  FALLS:  Has patient fallen in last 6 months? No  OCCUPATION:  OC support rep  PLOF:  Independent  PATIENT GOALS:  Decrease pain  OBJECTIVE:  Note: Objective measures were completed at Evaluation unless otherwise noted.  PATIENT SURVEYS:  LEFS 6/80  COGNITIVE STATUS: Within functional limits for tasks assessed   SENSATION: WFL  EDEMA:  Yes: mild at anterior hip/proximal thigh as expected post op   GAIT: Eval: bil axillary crutches, TDWB, demo ER and heel whip from toe off, lacking knee flexion in swing through   Body Part #1 Hip  PALPATION: EVAL: very guarded from PROM, able to achieve approx 35 deg flexion                                                                                                                             TREATMENT DATE:   Treatment                            2/14:  R hip PROM (gentle) Bridge x10 Prone HSC 2x10 Prone IR/ER 2x10 Reviewed gait with single crutch   Treatment                            2/7:  R hip PROM (gentle) Glute sets 5" x20 Quads sets 5" x20 Quadruped shifting ant/post and lateral  Prone HSC 2x10 Prone IR/ER 2x10 LAQ 5" 2x10   Treatment                            2/4:  Discussed continued use of crutches with normal gait  pattern Roller Rt hip and thigh Edu on self STM with tennis ball to pectineus/hip flexor region Supine mini bridge with legs over bolster   Treatment                            02/01: - quad sets x20 with 5 sec holds.  - ankle pumps  - heel slides to tolerance with assistance  - Redressed wound and provided extra.  - prone position, HS curls -  glute squeezes in standing.    Treatment                            1/20:  Changed bandages Passive hip flexion (to 35 deg), ER/IR in supine Supine glut set Prone position, +hamstring curl    PATIENT EDUCATION:  Education details: Anatomy of condition, POC, HEP, exercise form/rationale Person educated: Patient and sister  Education method: Explanation, Demonstration, Tactile cues, Verbal cues, and Handouts Education comprehension: verbalized understanding, returned demonstration, verbal cues required, tactile cues required, and needs further education  HOME EXERCISE PROGRAM: [id number]   ASSESSMENT:  CLINICAL IMPRESSION: Tx time limited due to late arrival. Good tolerance for PROM. Initiated s/l clams today which she initially had discomfort with but was able to complete 10 reps. Discussed pain management options including ice and self massage. Inspected incisions which show mild redness at inferior incision. No other concern for infection present at this time. Pt reports having mild drainage here yesterday. Placed tegaderm here for showering, but instructed pt to remove this weekend to inspect again and to notify MD if drainage or redness worsens.   Will monitor for infection.    REHAB POTENTIAL: Good  CLINICAL DECISION MAKING: Stable/uncomplicated  EVALUATION COMPLEXITY: Low   GOALS: Goals reviewed with patient? Yes  SHORT TERM GOALS: Target date: 4 weeks 01/12/24  Pain free flexion to 90 Baseline: Goal status: IN PROGRESS   2.  Demo controlled quad set with hold Baseline:  Goal status: MET 2/7  3.  30s sit to stand  without pain or compensation Baseline:  Goal status: INITIAL   4.  SLS 30s without compensation Baseline:  Goal status: INITIAL  LONG TERM GOALS:  Able to demo step up on 6" step with level pelvis and proper form Baseline:  Goal status: INITIAL Week 8 3/17  2.  Will tolerate at least 3 min on elliptical, demonstrating good tolerance to repetitive weight bearing motion Baseline:  Goal status: INITIAL  Week 8   3.  Demonstrate proper form in at least 10 continuous lunges without increased pain Baseline:  Goal status: INITIAL  Week 12 4/14  4.  Demonstrate gentle, double and single foot plyometric motions with good proximal form Baseline:  Goal status: INITIAL Week 12     PLAN:  PT FREQUENCY: 1-2x/week  PT DURATION: POC dates  PLANNED INTERVENTIONS: 97164- PT Re-evaluation, 97110-Therapeutic exercises, 97530- Therapeutic activity, [id number]- Neuromuscular re-education, 97535- Self Care, 11914- Manual therapy, [phone]- Gait training, [phone]- Aquatic Therapy, Patient/Family education, Balance training, Stair training, Taping, Dry Needling, Joint mobilization, Spinal mobilization, Scar mobilization, Cryotherapy, and Moist heat.  PLAN FOR NEXT SESSION: per protocol  Donnel Saxon Tiarra Anastacio, PTA 01/09/2024, 1:39 PM

## 2024-01-12 ENCOUNTER — Encounter (HOSPITAL_BASED_OUTPATIENT_CLINIC_OR_DEPARTMENT_OTHER): Payer: Self-pay

## 2024-01-15 ENCOUNTER — Ambulatory Visit (HOSPITAL_BASED_OUTPATIENT_CLINIC_OR_DEPARTMENT_OTHER): Payer: 59

## 2024-01-17 ENCOUNTER — Encounter (HOSPITAL_BASED_OUTPATIENT_CLINIC_OR_DEPARTMENT_OTHER): Payer: Self-pay | Admitting: Physical Therapy

## 2024-01-17 ENCOUNTER — Ambulatory Visit (HOSPITAL_BASED_OUTPATIENT_CLINIC_OR_DEPARTMENT_OTHER): Payer: 59 | Admitting: Physical Therapy

## 2024-01-17 DIAGNOSIS — M25551 Pain in right hip: Secondary | ICD-10-CM

## 2024-01-17 DIAGNOSIS — R262 Difficulty in walking, not elsewhere classified: Secondary | ICD-10-CM

## 2024-01-17 DIAGNOSIS — M6281 Muscle weakness (generalized): Secondary | ICD-10-CM | POA: Diagnosis not present

## 2024-01-17 NOTE — Therapy (Signed)
 OUTPATIENT PHYSICAL THERAPY TREATMENT   Patient Name: Ann Taylor MRN: [id number] DOB:10-15-1981, 43 y.o., female Today's Date: 01/17/2024  END OF SESSION:  PT End of Session - 01/17/24 1120     Visit Number 6    Number of Visits 25    Date for PT Re-Evaluation 03/13/24    Authorization Type [employer]    PT Start Time 1130    PT Stop Time 1210    PT Time Calculation (min) 40 min    Activity Tolerance Patient tolerated treatment well    Behavior During Therapy [facility] for tasks assessed/performed                 Past Medical History:  Diagnosis Date   Arrhythmia    Headache(784.0)    Heart murmur    UTI (lower urinary tract infection)    Past Surgical History:  Procedure Laterality Date   BREAST BIOPSY Left 04/17/2023   MM LT BREAST BX W LOC DEV 1ST LESION IMAGE BX SPEC STEREO GUIDE 04/17/2023 [facility]   CESAREAN SECTION     Patient Active Problem List   Diagnosis Date Noted   Visit for routine gyn exam 03/27/2023   STD exposure 03/27/2023   Nexplanon in place 03/27/2023   Bilateral radiating leg pain 02/12/2023   Mood disorder (HCC) 02/12/2023   Essential hypertension 07/28/2021   Family history of sickle cell anemia 07/28/2021   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   Neuropathy 07/28/2021   Other allergy, initial encounter 07/28/2021   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 She  arrives with single axillary  crutches and an OTC hip spica brace. She states that she doesn't try to move to much other then necessary. She reports  7-10/10 pain pending what she is doing and if/ when medicated.   Pt reports when she was getting into car lifting her R leg to get in the car, her hip "popped" and she had increased pain.  Continues to have increased pain with sitting and pressure  on R hip. Has been using ice. "I may be overdoing it at home." Arrives with bilateral crutches.   PERTINENT HISTORY:  Chronic hip pain  PAIN:  Are you having pain? Yes: NPRS scale: significant Pain location: Rt ant hip Pain description: hurts Aggravating factors: movement, pressure Relieving factors: ice  PRECAUTIONS:  None  RED FLAGS: None   WEIGHT BEARING RESTRICTIONS:  Yes Weight bearing as tolerated right hip  FALLS:  Has patient fallen in last 6 months? No  OCCUPATION:  OC support rep  PLOF:  Independent  PATIENT GOALS:  Decrease pain  OBJECTIVE:  Note: Objective measures were completed at Evaluation unless otherwise noted.  PATIENT SURVEYS:  LEFS 6/80  COGNITIVE STATUS: Within functional limits for tasks assessed   SENSATION: WFL  EDEMA:  Yes: mild at anterior hip/proximal thigh as expected post op   GAIT: Eval: bil axillary crutches, TDWB, demo ER and heel whip from toe off, lacking knee flexion in swing through   Body Part #1 Hip  PALPATION: EVAL: very guarded from PROM, able to achieve approx 35 deg flexion                                                                                                                             TREATMENT DATE:  Treatment                            2/22:  R hip flexion AAROM with strap in supine (gentle) R hip AROM into ab/ adduction to tolerance in supine [employer] sets x10, 5 sec hold.  Prone HSC 2x10, 1lb ankle weight, focus on eccentric control.  Prone IR/ER 2x10,1lb ankle weight Standing hip abduction,  extension and flexion to tolerance. X10  Treatment                            2/14:  R hip PROM (gentle) Bridge x10 Prone HSC 2x10 Prone IR/ER 2x10 Reviewed gait with single crutch   Treatment                            2/7:  R hip PROM (gentle) Glute sets 5" x20 Quads sets 5" x20 Quadruped shifting ant/post and lateral  Prone HSC 2x10 Prone IR/ER 2x10 LAQ 5" 2x10   Treatment                            2/4:  Discussed continued use of crutches with normal gait pattern Roller Rt hip and thigh Edu on self STM with tennis ball to pectineus/hip flexor region Supine mini bridge with legs over bolster   Treatment                            02/01: -  quad sets x20 with 5 sec holds.  - ankle pumps  - heel slides to tolerance with assistance  - Redressed wound and provided extra.  - prone position, HS curls -  glute squeezes in standing.    Treatment                            1/20:  Changed bandages Passive hip flexion (to 35 deg), ER/IR in supine Supine glut set Prone position, +hamstring curl    PATIENT EDUCATION:  Education details: Anatomy of condition, POC, HEP, exercise form/rationale Person educated: Patient and sister Education method: Explanation, Demonstration, Tactile cues, Verbal cues, and Handouts Education comprehension: verbalized understanding, returned demonstration, verbal cues required, tactile cues required, and needs further education  HOME EXERCISE PROGRAM: [email or web address]   ASSESSMENT:  CLINICAL IMPRESSION: Okay tolerance for AROM. Progressed pt as much as tolerated today in supine and standing. She is limited by pain, but is able to push through due to being medicated on oxy today. Discussed pain management options including ice and self massage. Inspected incisions which show no redness at inferior incision today. No other concern for infection present at this time. No more reports of pus reported. Scars look good. She continues to ambulate with a  single axillary crutch but is able to walk without an AD but antalgic gait pattern.     REHAB POTENTIAL: Good  CLINICAL DECISION MAKING: Stable/uncomplicated  EVALUATION COMPLEXITY: Low   GOALS: Goals reviewed with patient? Yes  SHORT TERM GOALS: Target date: 4 weeks 01/12/24  Pain free flexion to 90 Baseline: Goal status: IN PROGRESS   2.  Demo controlled quad set with hold Baseline:  Goal status: MET 2/7  3.  30s sit to stand without pain or compensation Baseline:  Goal status: INITIAL   4.  SLS 30s without compensation Baseline:  Goal status: INITIAL  LONG TERM GOALS:  Able to demo step up on 6" step with level pelvis and proper form Baseline:  Goal status: INITIAL Week 8 3/17  2.  Will tolerate at least 3 min on elliptical, demonstrating good tolerance to repetitive weight bearing motion Baseline:  Goal status: INITIAL  Week 8   3.  Demonstrate proper form in at least 10 continuous lunges without increased pain Baseline:  Goal status: INITIAL  Week 12 4/14  4.  Demonstrate gentle, double and single foot plyometric motions with good proximal form Baseline:  Goal status: INITIAL Week 12     PLAN:  PT FREQUENCY: 1-2x/week  PT DURATION: POC dates  PLANNED INTERVENTIONS: 97164- PT Re-evaluation, 97110-Therapeutic exercises, 97530- Therapeutic activity, [id number]- Neuromuscular re-education, 97535- Self Care, 54098- Manual therapy, [phone]- Gait training, [phone]- Aquatic Therapy, Patient/Family education, Balance training, Stair training, Taping, Dry Needling, Joint mobilization, Spinal mobilization, Scar mobilization, Cryotherapy, and Moist heat.  PLAN FOR NEXT SESSION: per protocol  Champ Mungo, PT 01/17/2024, 12:05 PM

## 2024-01-21 ENCOUNTER — Ambulatory Visit (HOSPITAL_BASED_OUTPATIENT_CLINIC_OR_DEPARTMENT_OTHER): Payer: 59

## 2024-01-22 ENCOUNTER — Ambulatory Visit (HOSPITAL_BASED_OUTPATIENT_CLINIC_OR_DEPARTMENT_OTHER): Payer: 59 | Admitting: Physical Therapy

## 2024-01-22 NOTE — Therapy (Deleted)
 OUTPATIENT PHYSICAL THERAPY TREATMENT   Patient Name: Ann Taylor MRN: [id number] DOB:[birth year], 43 y.o., female Today's Date: 01/22/2024  END OF SESSION:        Past Medical History:  Diagnosis Date   Arrhythmia    Headache(784.0)    Heart murmur    UTI (lower urinary tract infection)    Past Surgical History:  Procedure Laterality Date   BREAST BIOPSY Left 04/17/2023   MM LT BREAST BX W LOC DEV 1ST LESION IMAGE BX SPEC STEREO GUIDE 04/17/2023 [facility]   CESAREAN SECTION     Patient Active Problem List   Diagnosis Date Noted   Visit for routine gyn exam 03/27/2023   STD exposure 03/27/2023   Nexplanon in place 03/27/2023   Bilateral radiating leg pain 02/12/2023   Mood disorder (HCC) 02/12/2023   Essential hypertension 07/28/2021   Family history of sickle cell anemia 07/28/2021   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   Neuropathy 07/28/2021   Other allergy, initial encounter 07/28/2021   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 07/26/2021   Migraine 07/26/2021    REFERRING PROVIDER: Benancio Deeds, MD   REFERRING DIAG:  [phone] (ICD-10-CM) - Hip impingement syndrome, right   S/p right hip labral repair  Rationale for Evaluation and Treatment: Rehabilitation  THERAPY DIAG:  No diagnosis found.  ONSET DATE: DOS 1/16   SUBJECTIVE:                                                                                                                                                                                           SUBJECTIVE STATEMENT:  Pt 4 weeks s/p. She arrives with single axillary  crutches and an OTC hip spica brace. She states that she doesn't try to move to much other then necessary. She reports  7-10/10 pain pending what she is doing and if/ when medicated.   Pt reports when she was getting into car lifting her R leg to get in the car, her hip "popped" and she had increased pain.  Continues to have increased pain with sitting and pressure on R hip. Has been  using ice. "I may be overdoing it at home." Arrives with bilateral crutches.   PERTINENT HISTORY:  Chronic hip pain  PAIN:  Are you having pain? Yes: NPRS scale: significant Pain location: Rt ant hip Pain description: hurts Aggravating factors: movement, pressure Relieving factors: ice  PRECAUTIONS:  None  RED FLAGS: None   WEIGHT BEARING RESTRICTIONS:  Yes Weight bearing as tolerated right hip  FALLS:  Has patient fallen in last 6 months? No  OCCUPATION:  OC support rep  PLOF:  Independent  PATIENT GOALS:  Decrease pain  OBJECTIVE:  Note: Objective measures were completed at Evaluation unless otherwise noted.  PATIENT SURVEYS:  LEFS 6/80  COGNITIVE STATUS: Within functional limits for tasks assessed   SENSATION: WFL  EDEMA:  Yes: mild at anterior hip/proximal thigh as expected post op   GAIT: Eval: bil axillary crutches, TDWB, demo ER and heel whip from toe off, lacking knee flexion in swing through   Body Part #1 Hip  PALPATION: EVAL: very guarded from PROM, able to achieve approx 35 deg flexion                                                                                                                             TREATMENT DATE:  Treatment                            2/22:  R hip flexion AAROM with strap in supine (gentle) R hip AROM into ab/ adduction to tolerance in supine [employer] sets x10, 5 sec hold.  Prone HSC 2x10, 1lb ankle weight, focus on eccentric control.  Prone IR/ER 2x10,1lb ankle weight Standing hip abduction, extension and flexion to tolerance. X10  Treatment                            2/14:  R hip PROM (gentle) Bridge x10 Prone HSC 2x10 Prone IR/ER 2x10 Reviewed gait with single crutch   Treatment                            2/7:  R hip PROM (gentle) Glute sets 5" x20 Quads sets 5" x20 Quadruped shifting ant/post and lateral  Prone HSC 2x10 Prone IR/ER 2x10 LAQ 5" 2x10   Treatment                             2/4:  Discussed continued use of crutches with normal gait pattern Roller Rt hip and thigh Edu on self STM with tennis ball to pectineus/hip flexor region Supine mini bridge with legs over bolster   Treatment                            02/01: - quad sets x20 with 5 sec holds.  - ankle pumps  - heel slides to tolerance with assistance  - Redressed wound and provided extra.  - prone position, HS curls -  glute squeezes in standing.    Treatment                            1/20:  Changed bandages Passive hip flexion (to 35 deg), ER/IR in supine Supine glut set Prone position, +hamstring curl    PATIENT EDUCATION:  Education details:  Anatomy of condition, POC, HEP, exercise form/rationale Person educated: Patient and sister Education method: Explanation, Demonstration, Tactile cues, Verbal cues, and Handouts Education comprehension: verbalized understanding, returned demonstration, verbal cues required, tactile cues required, and needs further education  HOME EXERCISE PROGRAM: [email or web address]   ASSESSMENT:  CLINICAL IMPRESSION: Okay tolerance for AROM. Progressed pt as much as tolerated today in supine and standing. She is limited by pain, but is able to push through due to being medicated on oxy today. Discussed pain management options including ice and self massage. Inspected incisions which show no redness at inferior incision today. No other concern for infection present at this time. No more reports of pus reported. Scars look good. She continues to ambulate with a single axillary crutch but is able to walk without an AD but antalgic gait pattern.     REHAB POTENTIAL: Good  CLINICAL DECISION MAKING: Stable/uncomplicated  EVALUATION COMPLEXITY: Low   GOALS: Goals reviewed with patient? Yes  SHORT TERM GOALS: Target date: 4 weeks 01/12/24  Pain free flexion to 90 Baseline: Goal status: IN PROGRESS   2.  Demo controlled quad set with hold Baseline:  Goal  status: MET 2/7  3.  30s sit to stand without pain or compensation Baseline:  Goal status: INITIAL   4.  SLS 30s without compensation Baseline:  Goal status: INITIAL  LONG TERM GOALS:  Able to demo step up on 6" step with level pelvis and proper form Baseline:  Goal status: INITIAL Week 8 3/17  2.  Will tolerate at least 3 min on elliptical, demonstrating good tolerance to repetitive weight bearing motion Baseline:  Goal status: INITIAL  Week 8   3.  Demonstrate proper form in at least 10 continuous lunges without increased pain Baseline:  Goal status: INITIAL  Week 12 4/14  4.  Demonstrate gentle, double and single foot plyometric motions with good proximal form Baseline:  Goal status: INITIAL Week 12     PLAN:  PT FREQUENCY: 1-2x/week  PT DURATION: POC dates  PLANNED INTERVENTIONS: 97164- PT Re-evaluation, 97110-Therapeutic exercises, 97530- Therapeutic activity, [id number]- Neuromuscular re-education, 97535- Self Care, [id number]- Manual therapy, [phone]- Gait training, [phone]- Aquatic Therapy, Patient/Family education, Balance training, Stair training, Taping, Dry Needling, Joint mobilization, Spinal mobilization, Scar mobilization, Cryotherapy, and Moist heat.  PLAN FOR NEXT SESSION: per protocol  Champ Mungo, PT 01/22/2024, 7:10 AM

## 2024-01-23 ENCOUNTER — Telehealth (HOSPITAL_BASED_OUTPATIENT_CLINIC_OR_DEPARTMENT_OTHER): Payer: Self-pay | Admitting: Orthopaedic Surgery

## 2024-01-23 ENCOUNTER — Ambulatory Visit (INDEPENDENT_AMBULATORY_CARE_PROVIDER_SITE_OTHER): Payer: 59 | Admitting: Orthopaedic Surgery

## 2024-01-23 ENCOUNTER — Encounter (HOSPITAL_BASED_OUTPATIENT_CLINIC_OR_DEPARTMENT_OTHER): Payer: Self-pay

## 2024-01-23 DIAGNOSIS — M25851 Other specified joint disorders, right hip: Secondary | ICD-10-CM

## 2024-01-23 NOTE — Telephone Encounter (Signed)
 Patient states she needs refills on oxcondone for her pain

## 2024-01-23 NOTE — Progress Notes (Signed)
 Post Operative Evaluation    Procedure/Date of Surgery: Status post right hip pincer debridement 1/16  Interval History:   Presents 6 weeks status post right hip pincer debridement continuing to improve slowly.  She does occasionally experience some groin pain.  She is continuing to work with physical therapy and progress her weightbearing   PMH/PSH/Family History/Social History/Meds/Allergies:    Past Medical History:  Diagnosis Date   Arrhythmia    Headache(784.0)    Heart murmur    UTI (lower urinary tract infection)    Past Surgical History:  Procedure Laterality Date   BREAST BIOPSY Left 04/17/2023   MM LT BREAST BX W LOC DEV 1ST LESION IMAGE BX SPEC STEREO GUIDE 04/17/2023 [facility]   CESAREAN SECTION     Social History   Socioeconomic History   Marital status: Single    Spouse name: Not on file   Number of children: Not on file   Years of education: Not on file   Highest education level: Not on file  Occupational History   Not on file  Tobacco Use   Smoking status: Never   Smokeless tobacco: Never  Vaping Use   Vaping status: Never Used  Substance and Sexual Activity   Alcohol use: No   Drug use: No   Sexual activity: Yes    Birth control/protection: None  Other Topics Concern   Not on file  Social History Narrative   Not on file   Social Drivers of Health   Financial Resource Strain: At Risk (04/25/2023)   Received from [place], [place]   [employer]    Financial Resource Strain: 2  Food Insecurity: Not on file (04/25/2023)  Transportation Needs: Not at Risk (04/25/2023)   Received from [place], [employer] Needs    Transportation: 1  Physical Activity: Not on File (03/10/2023)   Received from [place], [place]   Physical Activity    Physical Activity: 0  Stress: Not on File (03/10/2023)   Received from [place], [place]   Stress    Stress: 0  Social Connections: Not on File (07/29/2023)    Received from [employer]   Social Connections    Connectedness: 0   Family History  Problem Relation Age of Onset   Alcohol abuse Neg Hx    Arthritis Neg Hx    Asthma Neg Hx    Birth defects Neg Hx    Cancer Neg Hx    COPD Neg Hx    Depression Neg Hx    Diabetes Neg Hx    Drug abuse Neg Hx    Early death Neg Hx    Hearing loss Neg Hx    Heart disease Neg Hx    Hyperlipidemia Neg Hx    Hypertension Neg Hx    Kidney disease Neg Hx    Learning disabilities Neg Hx    Mental illness Neg Hx    Mental retardation Neg Hx    Miscarriages / Stillbirths Neg Hx    Stroke Neg Hx    Vision loss Neg Hx    Allergies  Allergen Reactions   Oxycodone Hives and Itching   Shellfish Allergy Anaphylaxis   Shellfish-Derived Products Anaphylaxis   Grass Pollen(K-O-R-T-Swt Vern) Hives   Codone [Hydrocodone] Itching   Codeine Hives, Itching and Rash   Tramadol Hives, Itching  and Anxiety   Current Outpatient Medications  Medication Sig Dispense Refill   meloxicam (MOBIC) 15 MG tablet Take 1 tablet (15 mg total) by mouth daily. (Patient not taking: Reported on 12/10/2023) 30 tablet 1   aspirin EC 325 MG tablet Take 1 tablet (325 mg total) by mouth daily. (Patient not taking: Reported on 12/10/2023) 14 tablet 0   cetirizine (ZYRTEC) 10 MG tablet Take 10 mg by mouth daily. Additional if needed for hives     diphenhydrAMINE (BENADRYL) 25 mg capsule Take 25 mg by mouth every 6 (six) hours as needed (When take Oxycodone). (Patient not taking: Reported on 12/10/2023)     gabapentin (NEURONTIN) 300 MG capsule Take 300 mg by mouth daily.     HYDROcodone-acetaminophen (NORCO/VICODIN) 5-325 MG tablet Take 1 tablet by mouth every 6 (six) hours as needed for moderate pain (pain score 4-6). 30 tablet 0   HYDROcodone-acetaminophen (NORCO/VICODIN) 5-325 MG tablet Take 1-2 tablets by mouth every 6 (six) hours as needed for moderate pain (pain score 4-6). 20 tablet 0   ibuprofen (ADVIL) 800 MG tablet Take 800 mg by mouth  every 8 (eight) hours as needed for moderate pain (pain score 4-6) or mild pain (pain score 1-3). (Patient not taking: Reported on 12/10/2023)     lidocaine (LIDODERM) 5 % Place 1 patch onto the skin daily. Remove & Discard patch within 12 hours or as directed by MD (Patient not taking: Reported on 11/03/2023) 30 patch 0   oxyCODONE (ROXICODONE) 5 MG immediate release tablet Take 1 tablet (5 mg total) by mouth every 4 (four) hours as needed for severe pain or breakthrough pain. (Patient not taking: Reported on 12/10/2023) 10 tablet 0   triamterene-hydrochlorothiazide (DYAZIDE) 37.5-25 MG capsule Take 1 each (1 capsule total) by mouth daily. 15 capsule 0   No current facility-administered medications for this visit.   No results found.  Review of Systems:   A ROS was performed including pertinent positives and negatives as documented in the HPI.   Musculoskeletal Exam:    There were no vitals taken for this visit.  Right hip incisions are well-appearing without erythema or drainage.  Distal neurosensory exam is intact.  30 degrees internal/external rotation of the right hip without significant pain.  Walks with a mildly antalgic gait  Imaging:      I personally reviewed and interpreted the radiographs.   Assessment:   6 weeks status post right hip arthroscopy with pincer debridement overall continuing to improve.  At this time I would like to have her engage in aquatic therapy.  I will plan to see her back in 6 weeks for reassessment  Plan :    -Return to clinic 6 weeks for reassessment      I personally saw and evaluated the patient, and participated in the management and treatment plan.  Huel Cote, MD Attending Physician, Orthopedic Surgery  This document was dictated using Dragon voice recognition software. A reasonable attempt at proof reading has been made to minimize errors.

## 2024-01-24 ENCOUNTER — Ambulatory Visit (HOSPITAL_BASED_OUTPATIENT_CLINIC_OR_DEPARTMENT_OTHER): Payer: 59 | Attending: Orthopaedic Surgery | Admitting: Physical Therapy

## 2024-01-24 ENCOUNTER — Encounter (HOSPITAL_BASED_OUTPATIENT_CLINIC_OR_DEPARTMENT_OTHER): Payer: Self-pay | Admitting: Physical Therapy

## 2024-01-24 DIAGNOSIS — R262 Difficulty in walking, not elsewhere classified: Secondary | ICD-10-CM | POA: Insufficient documentation

## 2024-01-24 DIAGNOSIS — M25551 Pain in right hip: Secondary | ICD-10-CM | POA: Diagnosis present

## 2024-01-24 DIAGNOSIS — M6281 Muscle weakness (generalized): Secondary | ICD-10-CM | POA: Diagnosis present

## 2024-01-24 NOTE — Therapy (Signed)
 OUTPATIENT PHYSICAL THERAPY TREATMENT   Patient Name: Ann Taylor MRN: [id number] DOB:Dec 05, 1980, 43 y.o., female Today's Date: 01/24/2024  END OF SESSION:  PT End of Session - 01/24/24 1103     Visit Number 7    Number of Visits 25    Date for PT Re-Evaluation 03/13/24    Authorization Type [employer]    PT Start Time 1101    PT Stop Time 1131    PT Time Calculation (min) 30 min    Activity Tolerance Patient tolerated treatment well    Behavior During Therapy [facility] for tasks assessed/performed                  Past Medical History:  Diagnosis Date   Arrhythmia    Headache(784.0)    Heart murmur    UTI (lower urinary tract infection)    Past Surgical History:  Procedure Laterality Date   BREAST BIOPSY Left 04/17/2023   MM LT BREAST BX W LOC DEV 1ST LESION IMAGE BX SPEC STEREO GUIDE 04/17/2023 [facility]   CESAREAN SECTION     Patient Active Problem List   Diagnosis Date Noted   Visit for routine gyn exam 03/27/2023   STD exposure 03/27/2023   Nexplanon in place 03/27/2023   Bilateral radiating leg pain 02/12/2023   Mood disorder (HCC) 02/12/2023   Essential hypertension 07/28/2021   Family history of sickle cell anemia 07/28/2021   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   Neuropathy 07/28/2021   Other allergy, initial encounter 07/28/2021   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 07/26/2021   Migraine 07/26/2021    REFERRING PROVIDER: Benancio Deeds, MD   REFERRING DIAG:  [phone] (ICD-10-CM) - Hip impingement syndrome, right   S/p right hip labral repair  Rationale for Evaluation and Treatment: Rehabilitation  THERAPY DIAG:  Pain in right hip  Difficulty in walking, not elsewhere classified  Muscle weakness (generalized)  ONSET DATE: DOS 1/16   SUBJECTIVE:                                                                                                                                                                                           SUBJECTIVE  STATEMENT: Still do my exercises and I take my time. Lean on the crutch when it hurts. Anterior pain. The minute I sit down, the N/T is constant- the whole leg.  Pt reports when she was getting into car lifting her R leg to get in the car, her hip "popped" and she had increased pain.  Continues to have increased pain with sitting and pressure on R hip. Has been using ice. "I may be overdoing  it at home." Arrives with bilateral crutches.   PERTINENT HISTORY:  Chronic hip pain  PAIN:  Are you having pain? Yes: NPRS scale: significant Pain location: Rt ant hip Pain description: hurts Aggravating factors: movement, pressure Relieving factors: ice  PRECAUTIONS:  None  RED FLAGS: None   WEIGHT BEARING RESTRICTIONS:  Yes Weight bearing as tolerated right hip  FALLS:  Has patient fallen in last 6 months? No  OCCUPATION:  OC support rep  PLOF:  Independent  PATIENT GOALS:  Decrease pain  OBJECTIVE:  Note: Objective measures were completed at Evaluation unless otherwise noted.  PATIENT SURVEYS:  LEFS 6/80  COGNITIVE STATUS: Within functional limits for tasks assessed   SENSATION: WFL  EDEMA:  Yes: mild at anterior hip/proximal thigh as expected post op   GAIT: Eval: bil axillary crutches, TDWB, demo ER and heel whip from toe off, lacking knee flexion in swing through   Body Part #1 Hip  PALPATION: EVAL: very guarded from PROM, able to achieve approx 35 deg flexion                                                                                                                             TREATMENT DATE:  Treatment                            3/1: Blank lines following charge title = not provided on this treatment date.   Manual:  TPDN No STM pectineus Prone STM glut med, piriformis PA joint mobs with knee on towel roll There-ex: Prone glut set + hamstring curl Prone small range hip ext with knee flexed Standing lateral and A/p weight shift with glut set    There-Act: Progressed weight shift into full gait cycle with single crutch and pt hip brace (full soft) Self Care:  Nuro-Re-ed:  Gait Training:    Treatment                            2/22:  R hip flexion AAROM with strap in supine (gentle) R hip AROM into ab/ adduction to tolerance in supine [employer] sets x10, 5 sec hold.  Prone HSC 2x10, 1lb ankle weight, focus on eccentric control.  Prone IR/ER 2x10,1lb ankle weight Standing hip abduction, extension and flexion to tolerance. X10  Treatment                            2/14:  R hip PROM (gentle) Bridge x10 Prone HSC 2x10 Prone IR/ER 2x10 Reviewed gait with single crutch   Treatment                            2/7:  R hip PROM (gentle) Glute sets 5" x20 Quads sets 5" x20 Quadruped shifting ant/post and lateral  Prone HSC 2x10 Prone IR/ER 2x10 LAQ 5" 2x10   Treatment                            2/4:  Discussed continued use of crutches with normal gait pattern Roller Rt hip and thigh Edu on self STM with tennis ball to pectineus/hip flexor region Supine mini bridge with legs over bolster   Treatment                            02/01: - quad sets x20 with 5 sec holds.  - ankle pumps  - heel slides to tolerance with assistance  - Redressed wound and provided extra.  - prone position, HS curls -  glute squeezes in standing.    Treatment                            1/20:  Changed bandages Passive hip flexion (to 35 deg), ER/IR in supine Supine glut set Prone position, +hamstring curl    PATIENT EDUCATION:  Education details: Anatomy of condition, POC, HEP, exercise form/rationale Person educated: Patient and sister Education method: Explanation, Demonstration, Tactile cues, Verbal cues, and Handouts Education comprehension: verbalized understanding, returned demonstration, verbal cues required, tactile cues required, and needs further education  HOME EXERCISE  PROGRAM: [id number]   ASSESSMENT:  CLINICAL IMPRESSION: Multiple cues to avoid guarding and to relax in manual therapy. Concordant pain found in trigger points in glut med and pectineus. Gait pattern looked much better without crutch when glutes were engaged through stance phase. Will transition to aquatic for the next 4 weeks.     REHAB POTENTIAL: Good  CLINICAL DECISION MAKING: Stable/uncomplicated  EVALUATION COMPLEXITY: Low   GOALS: Goals reviewed with patient? Yes  SHORT TERM GOALS: Target date: 4 weeks 01/12/24  Pain free flexion to 90 Baseline: Goal status: IN PROGRESS   2.  Demo controlled quad set with hold Baseline:  Goal status: MET 2/7  3.  30s sit to stand without pain or compensation Baseline:  Goal status: INITIAL   4.  SLS 30s without compensation Baseline:  Goal status: INITIAL  LONG TERM GOALS:  Able to demo step up on 6" step with level pelvis and proper form Baseline:  Goal status: INITIAL Week 8 3/17  2.  Will tolerate at least 3 min on elliptical, demonstrating good tolerance to repetitive weight bearing motion Baseline:  Goal status: INITIAL  Week 8   3.  Demonstrate proper form in at least 10 continuous lunges without increased pain Baseline:  Goal status: INITIAL  Week 12 4/14  4.  Demonstrate gentle, double and single foot plyometric motions with good proximal form Baseline:  Goal status: INITIAL Week 12     PLAN:  PT FREQUENCY: 1-2x/week  PT DURATION: POC dates  PLANNED INTERVENTIONS: 97164- PT Re-evaluation, 97110-Therapeutic exercises, 97530- Therapeutic activity, [id number]- Neuromuscular re-education, 97535- Self Care, [id number]- Manual therapy, [phone]- Gait training, [phone]- Aquatic Therapy, Patient/Family education, Balance training, Stair training, Taping, Dry Needling, Joint mobilization, Spinal mobilization, Scar mobilization, Cryotherapy, and Moist heat.  PLAN FOR NEXT SESSION: per protocol Tyrihanna Wingert C. Azrielle Springsteen PT,  DPT 01/24/24 11:35 AM

## 2024-01-27 ENCOUNTER — Ambulatory Visit (HOSPITAL_BASED_OUTPATIENT_CLINIC_OR_DEPARTMENT_OTHER): Payer: 59 | Admitting: Physical Therapy

## 2024-01-28 ENCOUNTER — Encounter (HOSPITAL_BASED_OUTPATIENT_CLINIC_OR_DEPARTMENT_OTHER): Payer: Self-pay | Admitting: Orthopaedic Surgery

## 2024-01-28 ENCOUNTER — Ambulatory Visit (HOSPITAL_BASED_OUTPATIENT_CLINIC_OR_DEPARTMENT_OTHER): Payer: Self-pay | Admitting: Physical Therapy

## 2024-01-29 ENCOUNTER — Encounter (HOSPITAL_BASED_OUTPATIENT_CLINIC_OR_DEPARTMENT_OTHER): Payer: Self-pay | Admitting: Orthopaedic Surgery

## 2024-01-30 ENCOUNTER — Encounter (HOSPITAL_BASED_OUTPATIENT_CLINIC_OR_DEPARTMENT_OTHER): Payer: 59

## 2024-01-30 ENCOUNTER — Ambulatory Visit (HOSPITAL_BASED_OUTPATIENT_CLINIC_OR_DEPARTMENT_OTHER): Admitting: Physical Therapy

## 2024-01-30 ENCOUNTER — Encounter (HOSPITAL_BASED_OUTPATIENT_CLINIC_OR_DEPARTMENT_OTHER): Payer: Self-pay | Admitting: Physical Therapy

## 2024-01-30 DIAGNOSIS — M25551 Pain in right hip: Secondary | ICD-10-CM

## 2024-01-30 DIAGNOSIS — M6281 Muscle weakness (generalized): Secondary | ICD-10-CM

## 2024-01-30 DIAGNOSIS — R262 Difficulty in walking, not elsewhere classified: Secondary | ICD-10-CM

## 2024-01-30 NOTE — Therapy (Signed)
 OUTPATIENT PHYSICAL THERAPY TREATMENT   Patient Name: Ann Taylor MRN: [id number] DOB:Apr 09, 1981, 43 y.o., female Today's Date: 01/30/2024  END OF SESSION:  PT End of Session - 01/30/24 1414     Visit Number 8    Number of Visits 25    Date for PT Re-Evaluation 03/13/24    Authorization Type [employer]    PT Start Time 1352    PT Stop Time 1430    PT Time Calculation (min) 38 min            Past Medical History:  Diagnosis Date   Arrhythmia    Headache(784.0)    Heart murmur    UTI (lower urinary tract infection)    Past Surgical History:  Procedure Laterality Date   BREAST BIOPSY Left 04/17/2023   MM LT BREAST BX W LOC DEV 1ST LESION IMAGE BX SPEC STEREO GUIDE 04/17/2023 [facility]   CESAREAN SECTION     Patient Active Problem List   Diagnosis Date Noted   Visit for routine gyn exam 03/27/2023   STD exposure 03/27/2023   Nexplanon in place 03/27/2023   Bilateral radiating leg pain 02/12/2023   Mood disorder (HCC) 02/12/2023   Essential hypertension 07/28/2021   Family history of sickle cell anemia 07/28/2021   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   Neuropathy 07/28/2021   Other allergy, initial encounter 07/28/2021   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 07/26/2021   Migraine 07/26/2021    REFERRING PROVIDER: Benancio Deeds, MD   REFERRING DIAG:  [id number] (ICD-10-CM) - Hip impingement syndrome, right   S/p right hip labral repair  Rationale for Evaluation and Treatment: Rehabilitation  THERAPY DIAG:  Pain in right hip  Difficulty in walking, not elsewhere classified  Muscle weakness (generalized)  Difficulty walking  ONSET DATE: DOS 1/16   SUBJECTIVE:                                                                                                                                                                                           SUBJECTIVE STATEMENT: Pt reports continued pain in R hip.  Occasional pop felt and "entire leg goes numb".     PERTINENT HISTORY:  Chronic hip pain  PAIN:  Are you having pain? Yes: NPRS scale: 8.5/10 Pain location: Rt ant hip Pain description: hurts Aggravating factors: movement, pressure Relieving factors: ice  PRECAUTIONS:  None  RED FLAGS: None   WEIGHT BEARING RESTRICTIONS:  Yes Weight bearing as tolerated right hip  FALLS:  Has patient fallen in last 6 months? No  OCCUPATION:  OC support rep  PLOF:  Independent  PATIENT GOALS:  Decrease pain  OBJECTIVE:  Note: Objective measures were completed at Evaluation unless otherwise noted.  PATIENT SURVEYS:  LEFS 6/80  COGNITIVE STATUS: Within functional limits for tasks assessed   SENSATION: WFL  EDEMA:  Yes: mild at anterior hip/proximal thigh as expected post op   GAIT: Eval: bil axillary crutches, TDWB, demo ER and heel whip from toe off, lacking knee flexion in swing through   Body Part #1 Hip  PALPATION: EVAL: very guarded from PROM, able to achieve approx 35 deg flexion                                                                                                                             TREATMENT DATE:  [facility] Adult PT Treatment:                                                DATE: 01/30/24 Pt seen for aquatic therapy today.  Treatment took place in water 3.5-4.75 ft in depth at the [facility] pool. Temp of water was 91.  Pt entered/exited the pool via stairs independently with bilat rail.  - Intro to aquatic therapy principles - unsupported walking backward 3 laps - unsupported walking forward 1 lap - forward walking with small gentle kicks (painful) stopped after 1/2 width-> marching (not tolerated) - unsupported side stepping partial widths -> full width with cues to adjust step length - straddling noodle with UE support on wall-> noodle under arms away from wall: cycling; legs dangling; hip abdct /addct and hip flex/ext  - UE on wall:  hip add/abd x 5;  hip extension to toe touch x 5,  to tolerance;  relaxed squat - return to walking with UE on noodle  -once dried off: applied reg rock tape to lateral Rt hip/thigh with perpendicular strip over center of hip-15% stretch.  I strip applied over ant R groin with 15% stretch.  Pt instructed on safe tape removal techniques.    Treatment                            3/1: Blank lines following charge title = not provided on this treatment date.   Manual:  TPDN No STM pectineus Prone STM glut med, piriformis PA joint mobs with knee on towel roll There-ex: Prone glut set + hamstring curl Prone small range hip ext with knee flexed Standing lateral and A/p weight shift with glut set   There-Act: Progressed weight shift into full gait cycle with single crutch and pt hip brace (full soft) Self Care:  Nuro-Re-ed:  Gait Training:    Treatment                            2/22:  R hip flexion AAROM with strap  in supine (gentle) R hip AROM into ab/ adduction to tolerance in supine [employer] sets x10, 5 sec hold.  Prone HSC 2x10, 1lb ankle weight, focus on eccentric control.  Prone IR/ER 2x10,1lb ankle weight Standing hip abduction, extension and flexion to tolerance. X10  Treatment                            2/14:  R hip PROM (gentle) Bridge x10 Prone HSC 2x10 Prone IR/ER 2x10 Reviewed gait with single crutch   Treatment                            2/7:  R hip PROM (gentle) Glute sets 5" x20 Quads sets 5" x20 Quadruped shifting ant/post and lateral  Prone HSC 2x10 Prone IR/ER 2x10 LAQ 5" 2x10   Treatment                            2/4:  Discussed continued use of crutches with normal gait pattern Roller Rt hip and thigh Edu on self STM with tennis ball to pectineus/hip flexor region Supine mini bridge with legs over bolster   Treatment                            02/01: - quad sets x20 with 5 sec holds.  - ankle pumps  - heel slides to tolerance with assistance  - Redressed wound and provided  extra.  - prone position, HS curls -  glute squeezes in standing.    Treatment                            1/20:  Changed bandages Passive hip flexion (to 35 deg), ER/IR in supine Supine glut set Prone position, +hamstring curl    PATIENT EDUCATION:  Education details: intro to aquatic therapy Person educated: Patient Education method: Explanation, Demonstration, Tactile cues, Verbal cues, and Handouts Education comprehension: verbalized understanding, returned demonstration, verbal cues required, tactile cues required, and needs further education  HOME EXERCISE PROGRAM: [email or web address]   ASSESSMENT:  CLINICAL IMPRESSION: Pt demonstrates safety and independence in aquatic setting with therapist instructing from deck. Pt demonstrates confidence in setting, moving throughout all depths easily. Pt with limited tolerance for aquatic exercises this date due to elevated pain level. Some relief while suspended in deeper water.  Pt reported reduction of pain by 2 points by end of session. Trial of rock tape applied to lateral R hip to assist with desensitization, decompression of tissue and increased proprioception.  Goals are ongoing.       REHAB POTENTIAL: Good  CLINICAL DECISION MAKING: Stable/uncomplicated  EVALUATION COMPLEXITY: Low   GOALS: Goals reviewed with patient? Yes  SHORT TERM GOALS: Target date: 4 weeks 01/12/24  Pain free flexion to 90 Baseline: Goal status: IN PROGRESS   2.  Demo controlled quad set with hold Baseline:  Goal status: MET 2/7  3.  30s sit to stand without pain or compensation Baseline:  Goal status: INITIAL   4.  SLS 30s without compensation Baseline:  Goal status: INITIAL  LONG TERM GOALS:  Able to demo step up on 6" step with level pelvis and proper form Baseline:  Goal status: INITIAL Week 8 3/17  2.  Will tolerate at least 3 min on  elliptical, demonstrating good tolerance to repetitive weight bearing motion Baseline:  Goal status:  INITIAL  Week 8   3.  Demonstrate proper form in at least 10 continuous lunges without increased pain Baseline:  Goal status: INITIAL  Week 12 4/14  4.  Demonstrate gentle, double and single foot plyometric motions with good proximal form Baseline:  Goal status: INITIAL Week 12     PLAN:  PT FREQUENCY: 1-2x/week  PT DURATION: POC dates  PLANNED INTERVENTIONS: 97164- PT Re-evaluation, 97110-Therapeutic exercises, 97530- Therapeutic activity, [id number]- Neuromuscular re-education, 97535- Self Care, [id number]- Manual therapy, [phone]- Gait training, [phone]- Aquatic Therapy, Patient/Family education, Balance training, Stair training, Taping, Dry Needling, Joint mobilization, Spinal mobilization, Scar mobilization, Cryotherapy, and Moist heat.  PLAN FOR NEXT SESSION: per protocol   Mayer Camel, PTA 01/30/24 5:19 PM [facility] [address] [place], [place], [address] Phone: [phone]   Fax:  [phone]

## 2024-02-02 ENCOUNTER — Ambulatory Visit (HOSPITAL_BASED_OUTPATIENT_CLINIC_OR_DEPARTMENT_OTHER): Admitting: Physical Therapy

## 2024-02-02 ENCOUNTER — Encounter (HOSPITAL_BASED_OUTPATIENT_CLINIC_OR_DEPARTMENT_OTHER): Payer: Self-pay | Admitting: Physical Therapy

## 2024-02-02 DIAGNOSIS — M25551 Pain in right hip: Secondary | ICD-10-CM

## 2024-02-02 DIAGNOSIS — M6281 Muscle weakness (generalized): Secondary | ICD-10-CM

## 2024-02-02 DIAGNOSIS — R262 Difficulty in walking, not elsewhere classified: Secondary | ICD-10-CM

## 2024-02-02 NOTE — Therapy (Signed)
 OUTPATIENT PHYSICAL THERAPY TREATMENT   Patient Name: Ann Taylor MRN: [id number] DOB:1981-09-17, 43 y.o., female Today's Date: 02/02/2024  END OF SESSION:  PT End of Session - 02/02/24 1625     Visit Number 9    Number of Visits 25    Date for PT Re-Evaluation 03/13/24    Authorization Type [employer] & [facility]    PT Start Time 1533    PT Stop Time 1612    PT Time Calculation (min) 39 min    Activity Tolerance Patient tolerated treatment well;Patient limited by pain    Behavior During Therapy [facility] for tasks assessed/performed             Past Medical History:  Diagnosis Date   Arrhythmia    Headache(784.0)    Heart murmur    UTI (lower urinary tract infection)    Past Surgical History:  Procedure Laterality Date   BREAST BIOPSY Left 04/17/2023   MM LT BREAST BX W LOC DEV 1ST LESION IMAGE BX SPEC STEREO GUIDE 04/17/2023 [facility]   CESAREAN SECTION     Patient Active Problem List   Diagnosis Date Noted   Visit for routine gyn exam 03/27/2023   STD exposure 03/27/2023   Nexplanon in place 03/27/2023   Bilateral radiating leg pain 02/12/2023   Mood disorder (HCC) 02/12/2023   Essential hypertension 07/28/2021   Family history of sickle cell anemia 07/28/2021   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   Neuropathy 07/28/2021   Other allergy, initial encounter 07/28/2021   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 07/26/2021   Migraine 07/26/2021    REFERRING PROVIDER: Benancio Deeds, MD   REFERRING DIAG:  [phone] (ICD-10-CM) - Hip impingement syndrome, right   S/p right hip labral repair  Rationale for Evaluation and Treatment: Rehabilitation  THERAPY DIAG:  Pain in right hip  Difficulty in walking, not elsewhere classified  Muscle weakness (generalized)  ONSET DATE: DOS 1/16   SUBJECTIVE:                                                                                                                                                                                            SUBJECTIVE STATEMENT: Pt reports popping continues followed immediately by burning, tingling and numbness.  I don't think it is getting any better.    PERTINENT HISTORY:  Chronic hip pain  PAIN:  Are you having pain? Yes: NPRS scale: 8.5/10 Pain location: Rt ant hip Pain description: hurts Aggravating factors: movement, pressure Relieving factors: ice  PRECAUTIONS:  None  RED FLAGS: None   WEIGHT BEARING RESTRICTIONS:  Yes Weight bearing as tolerated right hip  FALLS:  Has patient fallen in last 6 months? No  OCCUPATION:  OC support rep  PLOF:  Independent  PATIENT GOALS:  Decrease pain  OBJECTIVE:  Note: Objective measures were completed at Evaluation unless otherwise noted.  PATIENT SURVEYS:  LEFS 6/80  COGNITIVE STATUS: Within functional limits for tasks assessed   SENSATION: WFL  EDEMA:  Yes: mild at anterior hip/proximal thigh as expected post op   GAIT: Eval: bil axillary crutches, TDWB, demo ER and heel whip from toe off, lacking knee flexion in swing through   Body Part #1 Hip  PALPATION: EVAL: very guarded from PROM, able to achieve approx 35 deg flexion                                                                                                                             TREATMENT DATE:  [facility] Adult PT Treatment:                                                DATE: 02/02/24 Pt seen for aquatic therapy today.  Treatment took place in water 3.5-4.75 ft in depth at the [facility] pool. Temp of water was 91.  Pt entered/exited the pool via stairs independently with bilat rail.  In 100d pool - unsupported walking backward, forward and side stepping throughout session - Seated LAQ; hip add/abd - UE on wall:  hip add/abd x 5;  hip extension to toe touch x 5,  relaxed squat *step ups onto bottom step leading R/L 2 x 5 ea   Pt requires the buoyancy and hydrostatic pressure of water for support, and to offload joints by  unweighting joint load by at least 50 % in navel deep water and by at least 75-80% in chest to neck deep water.  Viscosity of the water is needed for resistance of strengthening. Water current perturbations provides challenge to standing balance requiring increased core activation.       Treatment                            3/1: Blank lines following charge title = not provided on this treatment date.   Manual:  TPDN No STM pectineus Prone STM glut med, piriformis PA joint mobs with knee on towel roll There-ex: Prone glut set + hamstring curl Prone small range hip ext with knee flexed Standing lateral and A/p weight shift with glut set   There-Act: Progressed weight shift into full gait cycle with single crutch and pt hip brace (full soft) Self Care:  Nuro-Re-ed:  Gait Training:    Treatment                            2/22:  R hip flexion AAROM with strap  in supine (gentle) R hip AROM into ab/ adduction to tolerance in supine [employer] sets x10, 5 sec hold.  Prone HSC 2x10, 1lb ankle weight, focus on eccentric control.  Prone IR/ER 2x10,1lb ankle weight Standing hip abduction, extension and flexion to tolerance. X10  Treatment                            2/14:  R hip PROM (gentle) Bridge x10 Prone HSC 2x10 Prone IR/ER 2x10 Reviewed gait with single crutch   Treatment                            2/7:  R hip PROM (gentle) Glute sets 5" x20 Quads sets 5" x20 Quadruped shifting ant/post and lateral  Prone HSC 2x10 Prone IR/ER 2x10 LAQ 5" 2x10   Treatment                            2/4:  Discussed continued use of crutches with normal gait pattern Roller Rt hip and thigh Edu on self STM with tennis ball to pectineus/hip flexor region Supine mini bridge with legs over bolster   Treatment                            02/01: - quad sets x20 with 5 sec holds.  - ankle pumps  - heel slides to tolerance with assistance  - Redressed wound and provided extra.   - prone position, HS curls -  glute squeezes in standing.    Treatment                            1/20:  Changed bandages Passive hip flexion (to 35 deg), ER/IR in supine Supine glut set Prone position, +hamstring curl    PATIENT EDUCATION:  Education details: intro to aquatic therapy Person educated: Patient Education method: Explanation, Demonstration, Tactile cues, Verbal cues, and Handouts Education comprehension: verbalized understanding, returned demonstration, verbal cues required, tactile cues required, and needs further education  HOME EXERCISE PROGRAM: [id number]   ASSESSMENT:  CLINICAL IMPRESSION: Pt reports at best slight improvement in hip pain for about 1 hour after last session.  She states pain continues to be high and constant. Pain described is likely nerve related (burning, electric, jolt, tingling and numbness).  She completes entire session today in 100d pool in attempts to decrease pain as she reports heat helps pain the most.  Minimal reduction of pain with session although she does tolerate low loading with step ups and movement of left hip to ~45d flex. Goals ongoing      REHAB POTENTIAL: Good  CLINICAL DECISION MAKING: Stable/uncomplicated  EVALUATION COMPLEXITY: Low   GOALS: Goals reviewed with patient? Yes  SHORT TERM GOALS: Target date: 4 weeks 01/12/24  Pain free flexion to 90 Baseline: Goal status: IN PROGRESS   2.  Demo controlled quad set with hold Baseline:  Goal status: MET 2/7  3.  30s sit to stand without pain or compensation Baseline:  Goal status: INITIAL   4.  SLS 30s without compensation Baseline:  Goal status: INITIAL  LONG TERM GOALS:  Able to demo step up on 6" step with level pelvis and proper form Baseline:  Goal status: INITIAL Week 8 3/17  2.  Will tolerate  at least 3 min on elliptical, demonstrating good tolerance to repetitive weight bearing motion Baseline:  Goal status: INITIAL  Week 8   3.   Demonstrate proper form in at least 10 continuous lunges without increased pain Baseline:  Goal status: INITIAL  Week 12 4/14  4.  Demonstrate gentle, double and single foot plyometric motions with good proximal form Baseline:  Goal status: INITIAL Week 12     PLAN:  PT FREQUENCY: 1-2x/week  PT DURATION: POC dates  PLANNED INTERVENTIONS: 97164- PT Re-evaluation, 97110-Therapeutic exercises, 97530- Therapeutic activity, [id number]- Neuromuscular re-education, 97535- Self Care, [id number]- Manual therapy, [phone]- Gait training, [phone]- Aquatic Therapy, Patient/Family education, Balance training, Stair training, Taping, Dry Needling, Joint mobilization, Spinal mobilization, Scar mobilization, Cryotherapy, and Moist heat.  PLAN FOR NEXT SESSION: per protocol   Rushie Chestnut) Harvest Stanco MPT 02/02/24 4:26 PM [facility] [address] [place], [place], [address] Phone: [phone]   Fax:  [phone]

## 2024-02-03 ENCOUNTER — Encounter (HOSPITAL_BASED_OUTPATIENT_CLINIC_OR_DEPARTMENT_OTHER): Payer: 59

## 2024-02-04 ENCOUNTER — Encounter (HOSPITAL_BASED_OUTPATIENT_CLINIC_OR_DEPARTMENT_OTHER): Payer: Self-pay | Admitting: Orthopaedic Surgery

## 2024-02-05 ENCOUNTER — Ambulatory Visit (HOSPITAL_BASED_OUTPATIENT_CLINIC_OR_DEPARTMENT_OTHER): Admitting: Physical Therapy

## 2024-02-06 ENCOUNTER — Encounter (HOSPITAL_COMMUNITY): Payer: Self-pay | Admitting: *Deleted

## 2024-02-06 ENCOUNTER — Ambulatory Visit (HOSPITAL_COMMUNITY)
Admission: EM | Admit: 2024-02-06 | Discharge: 2024-02-06 | Disposition: A | Discharge status: Home / Self Care | Attending: Emergency Medicine | Admitting: Emergency Medicine

## 2024-02-06 DIAGNOSIS — M25551 Pain in right hip: Secondary | ICD-10-CM

## 2024-02-06 DIAGNOSIS — G8918 Other acute postprocedural pain: Secondary | ICD-10-CM

## 2024-02-06 MED ORDER — KETOROLAC TROMETHAMINE 30 MG/ML IJ SOLN
30.0000 mg | Freq: Once | INTRAMUSCULAR | Status: AC
  Administered 2024-02-06: 30 mg via INTRAMUSCULAR

## 2024-02-06 MED ORDER — KETOROLAC TROMETHAMINE 30 MG/ML IJ SOLN
INTRAMUSCULAR | Status: AC
  Filled 2024-02-06: qty 1

## 2024-02-06 MED ORDER — NAPROXEN 500 MG PO TABS: 500.0000 mg | ORAL_TABLET | Freq: Two times a day (BID) | ORAL | 0 refills | Status: DC

## 2024-02-06 NOTE — Discharge Instructions (Signed)
 You were given Toradol, which is an anti-inflammatory to help with your pain, here in clinic today.    I have sent a prescription for naproxen that you can take twice daily as needed for pain.  Do not take this with other NSAIDs including Mobic, ibuprofen, Motrin, Advil, Aleve.    You can take up to 1000 mg of Tylenol for breakthrough pain.  Do not exceed 4000 mg of Tylenol in a day.  Keep your follow-up with your orthopedic doctor on 3/20.  I recommend following up with primary care as they can potentially provide you with a referral to a pain management doctor to further manage your chronic pain.  Return here as needed.

## 2024-02-06 NOTE — ED Provider Notes (Signed)
 [facility]    CSN: [id number] Arrival date & time: 02/06/24  1207      History   Chief Complaint Chief Complaint  Patient presents with   Hip Pain   Post-op Problem    HPI Ann Taylor is a 43 y.o. female.   Patient presents with right hip pain.  Patient had surgery on her hip on 12/11/2023.   Patient states that she was initially given a limited supply of oxycodone which she had a reaction to, and then was switched to hydrocodone which she states does not help her pain.    Patient states that her orthopedic doctor informed her that he can no longer prescribe her any narcotic pain medicine due to policy.  Patient states she has been taking ibuprofen and Mobic with minimal relief of since.  Patient states that she has trouble getting around and sleeping due to pain.   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 Authorizing Provider  cetirizine (ZYRTEC) 10 MG tablet Take 10 mg by mouth daily. Additional if needed for hives   Yes [provider]  ibuprofen (ADVIL) 800 MG tablet Take 800 mg by mouth every 8 (eight) hours as needed for moderate pain (pain score 4-6) or mild pain (pain score 1-3).   Yes [provider]  meloxicam (MOBIC) 15 MG tablet Take 1 tablet (15 mg total) by mouth daily. Patient not taking: Reported on 12/10/2023 11/04/23   Huel Cote, MD  naproxen (NAPROSYN) 500 MG tablet Take 1 tablet (500 mg total) by mouth 2 (two) times daily. 02/06/24  Yes Susann Givens, Eloisa Chokshi A, NP  triamterene-hydrochlorothiazide (DYAZIDE) 37.5-25 MG capsule Take 1 each (1 capsule total) by mouth daily. 09/24/22  Yes Constant, Peggy, MD  aspirin EC 325 MG tablet Take 1 tablet (325 mg total) by mouth daily. Patient not taking: Reported on 12/10/2023 08/15/23   Huel Cote, MD  diphenhydrAMINE (BENADRYL) 25 mg capsule Take 25 mg by mouth every 6 (six) hours as needed (When take Oxycodone). Patient not taking: Reported on 12/10/2023  [provider]  gabapentin (NEURONTIN) 300 MG capsule Take 300 mg by mouth daily.    [provider]  lidocaine (LIDODERM) 5 % Place 1 patch onto the skin daily. Remove & Discard patch within 12 hours or as directed by MD Patient not taking: Reported on 11/03/2023 04/02/23   Henderly, Britni A, PA-C    Family History Family History  Problem Relation Age of Onset   Alcohol abuse Neg Hx    Arthritis Neg Hx    Asthma Neg Hx    Birth defects Neg Hx    Cancer Neg Hx    COPD Neg Hx    Depression Neg Hx    Diabetes Neg Hx    Drug abuse Neg Hx    Early death Neg Hx    Hearing loss Neg Hx    Heart disease Neg Hx    Hyperlipidemia Neg Hx    Hypertension Neg Hx    Kidney disease Neg Hx    Learning disabilities Neg Hx    Mental illness Neg Hx    Mental retardation Neg Hx    Miscarriages / Stillbirths Neg Hx    Stroke Neg Hx    Vision  loss Neg Hx     Social History Social History   Tobacco Use   Smoking status: Never   Smokeless tobacco: Never  Vaping Use   Vaping status: Never Used  Substance Use Topics   Alcohol use: No   Drug use: No     Allergies   Oxycodone, Shellfish allergy, Shellfish-derived products, Grass pollen(k-o-r-t-swt vern), Codone [hydrocodone], Codeine, and Tramadol   Review of Systems Review of Systems  Per HPI  Physical Exam Triage Vital Signs ED Triage Vitals  Encounter Vitals Group     BP 02/06/24 1343 115/61     Systolic BP Percentile --      Diastolic BP Percentile --      Pulse Rate 02/06/24 1343 98     Resp 02/06/24 1343 18     Temp 02/06/24 1343 98.3 F (36.8 C)     Temp Source 02/06/24 1343 Oral     SpO2 02/06/24 1343 95 %     Weight --      Height --      Head Circumference --      Peak Flow --      Pain Score 02/06/24 1428 10     Pain Loc --      Pain Education --      Exclude from Growth Chart --    No data found.  Updated Vital Signs BP 115/61 (BP Location: Right Arm)   Pulse 98   Temp 98.3 F (36.8 C) (Oral)   Resp 18   LMP 01/14/2024 (Approximate)   SpO2 95%   Visual Acuity Right Eye Distance:   Left Eye Distance:   Bilateral Distance:    Right Eye Near:   Left Eye Near:    Bilateral Near:     Physical Exam Vitals and nursing note reviewed.  Constitutional:      General: She is awake. She is not in acute distress.    Appearance: Normal appearance. She is well-developed and well-groomed. She is not ill-appearing.  Musculoskeletal:     Right hip: Tenderness present. No deformity. Decreased range of motion.  Neurological:     Mental Status: She is alert.  Psychiatric:        Behavior: Behavior is cooperative.      UC Treatments / Results  Labs (all labs ordered are listed, but only abnormal results are displayed) Labs Reviewed - No data to display  EKG   Radiology No results found.  Procedures Procedures (including  critical care time)  Medications Ordered in UC Medications  ketorolac (TORADOL) 30 MG/ML injection 30 mg (30 mg Intramuscular Given 02/06/24 1428)    Initial Impression / Assessment and Plan / UC Course  I have reviewed the triage vital signs and the nursing notes.  Pertinent labs & imaging results that were available during my care of the patient were reviewed by me and considered in my medical decision making (see chart for details).     Upon assessment patient has decreased range of motion due to pain.  Discussed with patient that from an urgent care setting we cannot effectively treat chronic pain.  Offered Toradol injection and prescription for naproxen to assist with pain.  Patient agreeable to this.  Recommended that patient follow-up with primary care provider for possible referral to pain management.  Patient agreeable to plan at this time.  Discussed follow-up and return precautions. Final Clinical Impressions(s) / UC Diagnoses   Final diagnoses:  Post-operative pain  Right hip pain     Discharge Instructions      You were given Toradol, which is an anti-inflammatory to help with your pain, here in clinic today.    I have sent a prescription for naproxen that you can take twice daily as needed for pain.  Do not take this with other NSAIDs including Mobic, ibuprofen, Motrin, Advil, Aleve.    You can take up to 1000 mg of Tylenol for breakthrough pain.  Do not exceed 4000 mg of Tylenol in a day.  Keep your follow-up with your orthopedic doctor on 3/20.  I recommend following up with primary care as they can potentially provide you with a referral to a pain management doctor to further manage your chronic pain.  Return here as needed.   ED Prescriptions     Medication Sig Dispense Auth. Provider   naproxen (NAPROSYN) 500 MG tablet Take 1 tablet (500 mg total) by mouth 2 (two) times daily. 30 tablet Wynonia Lawman A, NP      PDMP not reviewed this encounter.    Wynonia Lawman A, NP 02/06/24 1459

## 2024-02-06 NOTE — ED Triage Notes (Signed)
 Pt states she has pain from her hip surgery 12/11/2023. She is out of work until 02/14/2024 has an appt with ortho on 02/12/2024. She states her ortho cant give her anymore pain meds due to [facility] policy. She is having a lot of pain and needs to see if she can get pain meds. She can't even function without pain meds.   She was taking oxycodone but given limited supply, then changed to hydrocodone which doesn't help her pain. She hasn't had any pain meds since 11/20025. She has been taking Ibu which doesn't help at all.

## 2024-02-07 ENCOUNTER — Encounter (HOSPITAL_BASED_OUTPATIENT_CLINIC_OR_DEPARTMENT_OTHER): Payer: 59 | Admitting: Physical Therapy

## 2024-02-10 ENCOUNTER — Encounter (HOSPITAL_BASED_OUTPATIENT_CLINIC_OR_DEPARTMENT_OTHER): Payer: 59 | Admitting: Physical Therapy

## 2024-02-10 ENCOUNTER — Ambulatory Visit (HOSPITAL_BASED_OUTPATIENT_CLINIC_OR_DEPARTMENT_OTHER): Admitting: Physical Therapy

## 2024-02-12 ENCOUNTER — Ambulatory Visit (INDEPENDENT_AMBULATORY_CARE_PROVIDER_SITE_OTHER): Admitting: Orthopaedic Surgery

## 2024-02-12 ENCOUNTER — Ambulatory Visit (HOSPITAL_BASED_OUTPATIENT_CLINIC_OR_DEPARTMENT_OTHER): Admitting: Physical Therapy

## 2024-02-12 DIAGNOSIS — M25851 Other specified joint disorders, right hip: Secondary | ICD-10-CM

## 2024-02-12 NOTE — Progress Notes (Signed)
 Post Operative Evaluation    Procedure/Date of Surgery: Status post right hip pincer debridement 1/16  Interval History:   Presents 8 weeks status post right hip pincer debridement continuing to improve slowly.  I did discuss that given the fact that her radiating type paresthesias were present prior to surgery I do believe there might be a herniated disc in the lower back which is causing symptoms and causing difficulty with rehab.  At this time she does not have any significant weakness in the right lower extremity.  She has been having a hard time making it consistently to rehab.   PMH/PSH/Family History/Social History/Meds/Allergies:    Past Medical History:  Diagnosis Date   Arrhythmia    Headache(784.0)    Heart murmur    UTI (lower urinary tract infection)    Past Surgical History:  Procedure Laterality Date   BREAST BIOPSY Left 04/17/2023   MM LT BREAST BX W LOC DEV 1ST LESION IMAGE BX SPEC STEREO GUIDE 04/17/2023 [facility]   CESAREAN SECTION     Social History   Socioeconomic History   Marital status: Single    Spouse name: Not on file   Number of children: Not on file   Years of education: Not on file   Highest education level: Not on file  Occupational History   Not on file  Tobacco Use   Smoking status: Never   Smokeless tobacco: Never  Vaping Use   Vaping status: Never Used  Substance and Sexual Activity   Alcohol use: No   Drug use: No   Sexual activity: Yes    Birth control/protection: None  Other Topics Concern   Not on file  Social History Narrative   Not on file   Social Drivers of Health   Financial Resource Strain: At Risk (04/25/2023)   Received from [place], [place]   Financial [employer]    Financial Resource Strain: 2  Food Insecurity: Not on file (04/25/2023)  Transportation Needs: Not at Risk (04/25/2023)   Received from [place], [employer] Needs    Transportation: 1  Physical  Activity: Not on File (03/10/2023)   Received from [place], [place]   Physical Activity    Physical Activity: 0  Stress: Not on File (03/10/2023)   Received from [place], [place]   Stress    Stress: 0  Social Connections: Not on File (07/29/2023)   Received from [employer]   Social Connections    Connectedness: 0   Family History  Problem Relation Age of Onset   Alcohol abuse Neg Hx    Arthritis Neg Hx    Asthma Neg Hx    Birth defects Neg Hx    Cancer Neg Hx    COPD Neg Hx    Depression Neg Hx    Diabetes Neg Hx    Drug abuse Neg Hx    Early death Neg Hx    Hearing loss Neg Hx    Heart disease Neg Hx    Hyperlipidemia Neg Hx    Hypertension Neg Hx    Kidney disease Neg Hx    Learning disabilities Neg Hx    Mental illness Neg Hx    Mental retardation Neg Hx    Miscarriages / Stillbirths Neg Hx    Stroke Neg Hx    Vision loss  Neg Hx    Allergies  Allergen Reactions   Oxycodone Hives and Itching   Shellfish Allergy Anaphylaxis   Shellfish-Derived Products Anaphylaxis   Grass Pollen(K-O-R-T-Swt Vern) Hives   Codone [Hydrocodone] Itching   Codeine Hives, Itching and Rash   Tramadol Hives, Itching and Anxiety   Current Outpatient Medications  Medication Sig Dispense Refill   meloxicam (MOBIC) 15 MG tablet Take 1 tablet (15 mg total) by mouth daily. (Patient not taking: Reported on 12/10/2023) 30 tablet 1   aspirin EC 325 MG tablet Take 1 tablet (325 mg total) by mouth daily. (Patient not taking: Reported on 12/10/2023) 14 tablet 0   cetirizine (ZYRTEC) 10 MG tablet Take 10 mg by mouth daily. Additional if needed for hives     diphenhydrAMINE (BENADRYL) 25 mg capsule Take 25 mg by mouth every 6 (six) hours as needed (When take Oxycodone). (Patient not taking: Reported on 12/10/2023)     gabapentin (NEURONTIN) 300 MG capsule Take 300 mg by mouth daily.     ibuprofen (ADVIL) 800 MG tablet Take 800 mg by mouth every 8 (eight) hours as needed for moderate pain (pain score 4-6) or mild pain  (pain score 1-3).     lidocaine (LIDODERM) 5 % Place 1 patch onto the skin daily. Remove & Discard patch within 12 hours or as directed by MD (Patient not taking: Reported on 11/03/2023) 30 patch 0   naproxen (NAPROSYN) 500 MG tablet Take 1 tablet (500 mg total) by mouth 2 (two) times daily. 30 tablet 0   triamterene-hydrochlorothiazide (DYAZIDE) 37.5-25 MG capsule Take 1 each (1 capsule total) by mouth daily. 15 capsule 0   No current facility-administered medications for this visit.   No results found.  Review of Systems:   A ROS was performed including pertinent positives and negatives as documented in the HPI.   Musculoskeletal Exam:    Last menstrual period 01/14/2024.  Right hip incisions are well-appearing without erythema or drainage.  Distal neurosensory exam is intact.  30 degrees internal/external rotation of the right hip without significant pain.  Walks with improved gait today.  Full right knee extension flexion strength with plantar and dorsiflexion strength with sensation intact to light touch in the L4-S1 distribution  Imaging:      I personally reviewed and interpreted the radiographs.   Assessment:   8 weeks status post right hip arthroscopy with pincer debridement overall continuing to improve.  At this time I have advised the importance of continuing to attend physical therapy but I would like to refer her to Dr. Alvester Morin for a lumbar epidural injection as I do believe her paresthesias are occurring in somewhat of an L4-L5 distribution for which she would benefit from an epidural injection which she has had in the past.  I will plan to see her back in 2 months for reassessment.  She does state that she is continuing to improve and is having some days of little to no pain in the hip joint which is overall slowly improving. Plan :    -Return to clinic 8 weeks for reassessment      I personally saw and evaluated the patient, and participated in the management and  treatment plan.  Huel Cote, MD Attending Physician, Orthopedic Surgery  This document was dictated using Dragon voice recognition software. A reasonable attempt at proof reading has been made to minimize errors.

## 2024-02-14 ENCOUNTER — Encounter (HOSPITAL_BASED_OUTPATIENT_CLINIC_OR_DEPARTMENT_OTHER): Payer: 59 | Admitting: Physical Therapy

## 2024-02-17 ENCOUNTER — Encounter (HOSPITAL_BASED_OUTPATIENT_CLINIC_OR_DEPARTMENT_OTHER): Payer: 59 | Admitting: Physical Therapy

## 2024-02-17 ENCOUNTER — Other Ambulatory Visit: Payer: Self-pay | Admitting: Physical Medicine and Rehabilitation

## 2024-02-17 ENCOUNTER — Ambulatory Visit (HOSPITAL_BASED_OUTPATIENT_CLINIC_OR_DEPARTMENT_OTHER): Admitting: Physical Therapy

## 2024-02-17 MED ORDER — DIAZEPAM 5 MG PO TABS: ORAL_TABLET | ORAL | 0 refills | Status: DC

## 2024-02-19 ENCOUNTER — Ambulatory Visit

## 2024-02-20 ENCOUNTER — Ambulatory Visit (INDEPENDENT_AMBULATORY_CARE_PROVIDER_SITE_OTHER): Admitting: Physical Medicine and Rehabilitation

## 2024-02-20 ENCOUNTER — Encounter (HOSPITAL_BASED_OUTPATIENT_CLINIC_OR_DEPARTMENT_OTHER): Payer: 59 | Admitting: Orthopaedic Surgery

## 2024-02-20 ENCOUNTER — Ambulatory Visit (HOSPITAL_BASED_OUTPATIENT_CLINIC_OR_DEPARTMENT_OTHER): Payer: 59 | Admitting: Physical Therapy

## 2024-02-20 ENCOUNTER — Other Ambulatory Visit: Payer: Self-pay

## 2024-02-20 VITALS — BP 118/75 | HR 80

## 2024-02-20 DIAGNOSIS — M5416 Radiculopathy, lumbar region: Secondary | ICD-10-CM | POA: Diagnosis not present

## 2024-02-20 MED ORDER — METHYLPREDNISOLONE ACETATE 40 MG/ML IJ SUSP
40.0000 mg | Freq: Once | INTRAMUSCULAR | Status: AC
  Administered 2024-02-20: 40 mg

## 2024-02-20 NOTE — Progress Notes (Signed)
 Ann Taylor - 43 y.o. female MRN [id number]  Date of birth: 1981/10/18  Office Visit Note: Visit Date: 02/20/2024 PCP: Donato Schultz, FNP Referred by: Donato Schultz, FNP  Subjective: Chief Complaint  Patient presents with   Lower Back - Pain   HPI:  Ann Taylor is a 43 y.o. female who comes in today at the request of Dr. Huel Cote for planned Right L5-S1 Lumbar Interlaminar epidural steroid injection with fluoroscopic guidance.  The patient has failed conservative care including home exercise, medications, time and activity modification.  This injection will be diagnostic and hopefully therapeutic.  Please see requesting physician notes for further details and justification.   ROS Otherwise per HPI.  Assessment & Plan: Visit Diagnoses:    ICD-10-CM   1. Lumbar radiculopathy  M54.16 XR C-ARM NO REPORT    Epidural Steroid injection    methylPREDNISolone acetate (DEPO-MEDROL) injection 40 mg      Plan: No additional findings.   Meds & Orders:  Meds ordered this encounter  Medications   methylPREDNISolone acetate (DEPO-MEDROL) injection 40 mg    Orders Placed This Encounter  Procedures   XR C-ARM NO REPORT   Epidural Steroid injection    Follow-up: Return for visit to requesting provider as needed.   Procedures: No procedures performed      Clinical History: EXAM: MRI LUMBAR SPINE WITHOUT CONTRAST   TECHNIQUE: Multiplanar, multisequence MR imaging of the lumbar spine was performed. No intravenous contrast was administered.   COMPARISON:  None   FINDINGS: Segmentation:  5 lumbar type vertebral bodies.   Alignment:  Normal   Vertebrae:  Normal   Conus medullaris and cauda equina: Conus extends to the T12 level. Conus and cauda equina appear normal.   Paraspinal and other soft tissues: Normal   Disc levels:   No abnormality at L4-5 or above.   At L5-S1, there is minimal bulging of the disc. No herniation or stenosis.    IMPRESSION: Nearly normal examination. Minimal bulging and desiccation of the L5-S1 disc. No stenosis or neural compression. The other levels are normal.     Electronically Signed   By: Paulina Fusi M.D.   On: 03/31/2022 16:41     Objective:  VS:  HT:    WT:   BMI:     BP:118/75  HR:80bpm  TEMP: ( )  RESP:  Physical Exam Vitals and nursing note reviewed.  Constitutional:      General: She is not in acute distress.    Appearance: Normal appearance. She is not ill-appearing.  HENT:     Head: Normocephalic and atraumatic.     Right Ear: External ear normal.     Left Ear: External ear normal.  Eyes:     Extraocular Movements: Extraocular movements intact.  Cardiovascular:     Rate and Rhythm: Normal rate.     Pulses: Normal pulses.  Pulmonary:     Effort: Pulmonary effort is normal. No respiratory distress.  Abdominal:     General: There is no distension.     Palpations: Abdomen is soft.  Musculoskeletal:        General: Tenderness present.     Cervical back: Neck supple.     Right lower leg: No edema.     Left lower leg: No edema.     Comments: Patient has good distal strength with no pain over the greater trochanters.  No clonus or focal weakness.  Skin:    Findings: No erythema, lesion  or rash.  Neurological:     General: No focal deficit present.     Mental Status: She is alert and oriented to person, place, and time.     Sensory: No sensory deficit.     Motor: No weakness or abnormal muscle tone.     Coordination: Coordination normal.  Psychiatric:        Mood and Affect: Mood normal.        Behavior: Behavior normal.      Imaging: No results found.

## 2024-02-20 NOTE — Progress Notes (Signed)
 Pain Scale   Average Pain 6        +Driver, -BT, -Dye Allergies.

## 2024-02-20 NOTE — Patient Instructions (Signed)

## 2024-02-26 ENCOUNTER — Ambulatory Visit (HOSPITAL_BASED_OUTPATIENT_CLINIC_OR_DEPARTMENT_OTHER): Attending: Orthopaedic Surgery | Admitting: Physical Therapy

## 2024-02-26 ENCOUNTER — Ambulatory Visit (INDEPENDENT_AMBULATORY_CARE_PROVIDER_SITE_OTHER)

## 2024-02-26 VITALS — BP 128/76 | HR 76 | Ht 60.0 in | Wt 180.0 lb

## 2024-02-26 DIAGNOSIS — Z3202 Encounter for pregnancy test, result negative: Secondary | ICD-10-CM | POA: Diagnosis not present

## 2024-02-26 DIAGNOSIS — R262 Difficulty in walking, not elsewhere classified: Secondary | ICD-10-CM | POA: Diagnosis present

## 2024-02-26 DIAGNOSIS — M25551 Pain in right hip: Secondary | ICD-10-CM | POA: Diagnosis present

## 2024-02-26 DIAGNOSIS — M6281 Muscle weakness (generalized): Secondary | ICD-10-CM | POA: Insufficient documentation

## 2024-02-26 DIAGNOSIS — Z30017 Encounter for initial prescription of implantable subdermal contraceptive: Secondary | ICD-10-CM | POA: Diagnosis not present

## 2024-02-26 LAB — POCT URINE PREGNANCY: Preg Test, Ur: NEGATIVE

## 2024-02-26 MED ORDER — ETONOGESTREL 68 MG ~~LOC~~ IMPL
68.0000 mg | DRUG_IMPLANT | Freq: Once | SUBCUTANEOUS | Status: AC
  Administered 2024-02-26: 68 mg via SUBCUTANEOUS

## 2024-02-26 NOTE — Progress Notes (Signed)
 OFFICE PROCEDURE NOTE  History:  Ann Taylor is a 43 y.o. [phone] here today for Nexplanon insertion. She reports having method in the past and was satisfied with the results. Patient denies current concerns.   Past Medical History:  Diagnosis Date   Arrhythmia    Headache(784.0)    Heart murmur    UTI (lower urinary tract infection)     Past Surgical History:  Procedure Laterality Date   BREAST BIOPSY Left 04/17/2023   MM LT BREAST BX W LOC DEV 1ST LESION IMAGE BX SPEC STEREO GUIDE 04/17/2023 [facility]   CESAREAN SECTION      The following portions of the patient's history were reviewed and updated as appropriate: allergies, current medications, past family history, past medical history, past social history, past surgical history and problem list.   Health Maintenance:  Normal pap and negative HRHPV on Mar 27, 2023.  Mammogram on Apr 03, 2023 with BiRads 0 and follow up recommended and completed.   Review of Systems:  Review of Systems  All other systems reviewed and are negative.   Objective:  Vitals: BP 128/76   Pulse 76   Ht 5' (1.524 m)   Wt 180 lb (81.6 kg)   LMP 02/11/2024 (Approximate)   BMI 35.15 kg/m   Physical Exam Vitals reviewed. Exam conducted with a chaperone present Morrie Sheldon, RN).  Constitutional:      Appearance: Normal appearance.  HENT:     Head: Normocephalic and atraumatic.  Eyes:     Conjunctiva/sclera: Conjunctivae normal.  Cardiovascular:     Rate and Rhythm: Normal rate.  Pulmonary:     Effort: Pulmonary effort is normal.  Abdominal:     Palpations: Abdomen is soft.  Musculoskeletal:     Cervical back: Normal range of motion.     Comments: Brace on right hip.   Skin:    General: Skin is warm and dry.  Neurological:     Mental Status: She is alert and oriented to person, place, and time.  Psychiatric:        Mood and Affect: Mood normal.        Behavior: Behavior normal.     Procedure   Nexplanon Insertion  Procedure: Ann Taylor requests insertion of Nexplanon for contraception method.  She understands the risks associated with insertion including pain, bleeding, infection, and paresthesias of the arm.  Patient also understands that Nexplanon can cause change in bleeding.  However, patient accepts and understands all these risks and desires to proceed.  Nexplanon inserted as below.  Informed consent signed.   Appropriate time out taken.  Patient's non-dominant left arm was identified prepped and draped in the usual sterile fashion.The area was prepped with alcohol swab and then injected with 3mL of 1% lidocaine along the anticipated insertion route.  The area was then prepped with betadine and allowed 60 seconds before being wiped away with sterile gauze. Nexplanon was removed from packaging and device was confirmed within needle by provider visualization.   The device was then inserted per manufacturers instruction without complications.  The device was then palpated in the patient's arm by patient and provider. The insertion area was hemostatic with pressure and covered with steri strip and band-aid.  The arm was then wrapped with pressure dressing.  Assessment & Plan:  43 year old Nexplanon Insertion  The patient tolerated the procedure well and was given post procedure instructions to include: *Remove of pressure dressing and band-aid in 12-24 hours. *Remove of  steri-strips after no more than 7 days. *Condom usage encouraged for the first 7 days to avoid pregnancy and always to avoid STDs. *Tylenol or ibuprofen for insertion pain/discomfort. *Call for any other questions, concerns, or complications.  -Patient to RTO prn or in one month for annual exam.   Cherre Robins MSN, CNM 02/26/2024

## 2024-02-26 NOTE — Progress Notes (Signed)
 Pt presents for Nexplanon insertion.  Last unprotected sex 1-2 months

## 2024-02-26 NOTE — Therapy (Signed)
 OUTPATIENT PHYSICAL THERAPY TREATMENT   Patient Name: Ann Taylor MRN: [id number] DOB:27-Jun-1981, 43 y.o., female Today's Date: 02/26/2024  END OF SESSION:  PT End of Session - 02/26/24 0822     Visit Number 10    Number of Visits 25    Date for PT Re-Evaluation 03/13/24    Authorization Type [employer] & [facility]    PT Start Time 0815    PT Stop Time 0845    PT Time Calculation (min) 30 min    Behavior During Therapy [facility] for tasks assessed/performed             Past Medical History:  Diagnosis Date   Arrhythmia    Headache(784.0)    Heart murmur    UTI (lower urinary tract infection)    Past Surgical History:  Procedure Laterality Date   BREAST BIOPSY Left 04/17/2023   MM LT BREAST BX W LOC DEV 1ST LESION IMAGE BX SPEC STEREO GUIDE 04/17/2023 [facility]   CESAREAN SECTION     Patient Active Problem List   Diagnosis Date Noted   Visit for routine gyn exam 03/27/2023   STD exposure 03/27/2023   Nexplanon in place 03/27/2023   Bilateral radiating leg pain 02/12/2023   Mood disorder (HCC) 02/12/2023   Essential hypertension 07/28/2021   Family history of sickle cell anemia 07/28/2021   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   Neuropathy 07/28/2021   Other allergy, initial encounter 07/28/2021   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 07/26/2021   Migraine 07/26/2021    REFERRING PROVIDER: Benancio Deeds, MD   REFERRING DIAG:  [phone] (ICD-10-CM) - Hip impingement syndrome, right   S/p right hip labral repair  Rationale for Evaluation and Treatment: Rehabilitation  THERAPY DIAG:  Pain in right hip  Difficulty in walking, not elsewhere classified  Muscle weakness (generalized)  ONSET DATE: DOS 1/16   SUBJECTIVE:                                                                                                                                                                                           SUBJECTIVE STATEMENT: Pt reports she had relief of pain in hip for 2 days  after epidural in back."I felt great over the weekend".  Pain has returned in front of R hip and into thigh. She reports when her R ant hip pops,  she experiences numbness and tingling down RLE to toes.  She reports she continues to wear hip brace when she is out and about, but not when at home.  She reports continued difficulty sleeping.      PERTINENT HISTORY:  Chronic hip pain  PAIN:  Are you having pain? Yes: NPRS scale: 7/10 Pain location: Rt ant hip, posterior/lateral hip  Pain description: hurts Aggravating factors: movement, pressure Relieving factors: ice  PRECAUTIONS:  None  RED FLAGS: None   WEIGHT BEARING RESTRICTIONS:  Yes Weight bearing as tolerated right hip  FALLS:  Has patient fallen in last 6 months? No  OCCUPATION:  OC support rep  PLOF:  Independent  PATIENT GOALS:  Decrease pain  OBJECTIVE:  Note: Objective measures were completed at Evaluation unless otherwise noted.  PATIENT SURVEYS:  LEFS 6/80  COGNITIVE STATUS: Within functional limits for tasks assessed   SENSATION: WFL  EDEMA:  Yes: mild at anterior hip/proximal thigh as expected post op   GAIT: Eval: bil axillary crutches, TDWB, demo ER and heel whip from toe off, lacking knee flexion in swing through   Body Part #1 Hip  PALPATION: EVAL: very guarded from PROM, able to achieve approx 35 deg flexion                                                                                                                             TREATMENT DATE:  [facility] Adult PT Treatment:                                                DATE: 02/26/24 Pt seen for aquatic therapy today.  Treatment took place in water 3.5-4.75 ft in depth at the [facility] pool. Temp of water was 91.  Pt entered/exited the pool via stairs in step-to/through pattern with bilat rail.  in 4+ ft of water: - Unsupported  walking forward/ backward 3 laps; side stepping 3 laps - UE on wall: hip ext to toe touch x 10 each  LE; hip abdct (limited range) x 10 each LE - relaxed squat at wall - decompress with yellow noodle under arms, some relief reported - return to walking forward / backward  - discussed utilizing lumbar support in car and work chair, using ice to hip and back, and engaging abdominals during transitional movements for lumbar support  Pt requires the buoyancy and hydrostatic pressure of water for support, and to offload joints by unweighting joint load by at least 50 % in navel deep water and by at least 75-80% in chest to neck deep water.  Viscosity of the water is needed for resistance of strengthening. Water current perturbations provides challenge to standing balance requiring increased core activation.   [facility] Adult PT Treatment:                                                DATE: 02/02/24 Pt seen for aquatic therapy today.  Treatment took place in water 3.5-4.75 ft  in depth at the [facility] pool. Temp of water was 91.  Pt entered/exited the pool via stairs independently with bilat rail.  In 100d pool - unsupported walking backward, forward and side stepping throughout session - Seated LAQ; hip add/abd - UE on wall:  hip add/abd x 5;  hip extension to toe touch x 5,  relaxed squat *step ups onto bottom step leading R/L 2 x 5 ea   Pt requires the buoyancy and hydrostatic pressure of water for support, and to offload joints by unweighting joint load by at least 50 % in navel deep water and by at least 75-80% in chest to neck deep water.  Viscosity of the water is needed for resistance of strengthening. Water current perturbations provides challenge to standing balance requiring increased core activation.       Treatment                            3/1: Blank lines following charge title = not provided on this treatment date.   Manual:  TPDN No STM pectineus Prone STM glut med, piriformis PA joint mobs with knee on towel roll There-ex: Prone glut set + hamstring curl Prone  small range hip ext with knee flexed Standing lateral and A/p weight shift with glut set   There-Act: Progressed weight shift into full gait cycle with single crutch and pt hip brace (full soft) Self Care:  Nuro-Re-ed:  Gait Training:    Treatment                            2/22:  R hip flexion AAROM with strap in supine (gentle) R hip AROM into ab/ adduction to tolerance in supine [employer] sets x10, 5 sec hold.  Prone HSC 2x10, 1lb ankle weight, focus on eccentric control.  Prone IR/ER 2x10,1lb ankle weight Standing hip abduction, extension and flexion to tolerance. X10  Treatment                            2/14:  R hip PROM (gentle) Bridge x10 Prone HSC 2x10 Prone IR/ER 2x10 Reviewed gait with single crutch   Treatment                            2/7:  R hip PROM (gentle) Glute sets 5" x20 Quads sets 5" x20 Quadruped shifting ant/post and lateral  Prone HSC 2x10 Prone IR/ER 2x10 LAQ 5" 2x10   Treatment                            2/4:  Discussed continued use of crutches with normal gait pattern Roller Rt hip and thigh Edu on self STM with tennis ball to pectineus/hip flexor region Supine mini bridge with legs over bolster   Treatment                            02/01: - quad sets x20 with 5 sec holds.  - ankle pumps  - heel slides to tolerance with assistance  - Redressed wound and provided extra.  - prone position, HS curls -  glute squeezes in standing.    Treatment  1/20:  Changed bandages Passive hip flexion (to 35 deg), ER/IR in supine Supine glut set Prone position, +hamstring curl    PATIENT EDUCATION:  Education details: aquatic therapy progressions/ modifications Person educated: Patient Education method: Explanation, Demonstration, Tactile cues, Verbal cues, and Handouts Education comprehension: verbalized understanding, returned demonstration, verbal cues required, tactile cues required, and needs  further education  HOME EXERCISE PROGRAM: [id number]   ASSESSMENT:  CLINICAL IMPRESSION: Pt is 11 wks s/p labral repair.  Pt reports no change in pain in R hip during session; exercising while submerged in chest deep water.  Session shortened due to pt's late arrival.  Goals ongoing. Pt may benefit from further education on posture and body mechanics (hand out).        REHAB POTENTIAL: Good  CLINICAL DECISION MAKING: Stable/uncomplicated  EVALUATION COMPLEXITY: Low   GOALS: Goals reviewed with patient? Yes  SHORT TERM GOALS: Target date: 4 weeks 01/12/24  Pain free flexion to 90 Baseline: Goal status: IN PROGRESS   2.  Demo controlled quad set with hold Baseline:  Goal status: MET 2/7  3.  30s sit to stand without pain or compensation Baseline:  Goal status: INITIAL   4.  SLS 30s without compensation Baseline:  Goal status: INITIAL  LONG TERM GOALS:  Able to demo step up on 6" step with level pelvis and proper form Baseline:  Goal status: INITIAL Week 8 3/17  2.  Will tolerate at least 3 min on elliptical, demonstrating good tolerance to repetitive weight bearing motion Baseline:  Goal status: INITIAL  Week 8   3.  Demonstrate proper form in at least 10 continuous lunges without increased pain Baseline:  Goal status: INITIAL  Week 12 4/14  4.  Demonstrate gentle, double and single foot plyometric motions with good proximal form Baseline:  Goal status: INITIAL Week 12     PLAN:  PT FREQUENCY: 1-2x/week  PT DURATION: POC dates  PLANNED INTERVENTIONS: 97164- PT Re-evaluation, 97110-Therapeutic exercises, 97530- Therapeutic activity, [id number]- Neuromuscular re-education, 97535- Self Care, 08657- Manual therapy, [phone]- Gait training, [phone]- Aquatic Therapy, Patient/Family education, Balance training, Stair training, Taping, Dry Needling, Joint mobilization, Spinal mobilization, Scar mobilization, Cryotherapy, and Moist heat.  PLAN FOR NEXT SESSION: per  protocol   Mayer Camel, PTA 02/26/24 11:38 AM [facility] [address] [place], [place], [address] Phone: [phone]   Fax:  [phone]

## 2024-02-27 ENCOUNTER — Ambulatory Visit (HOSPITAL_BASED_OUTPATIENT_CLINIC_OR_DEPARTMENT_OTHER): Admitting: Physical Therapy

## 2024-03-04 ENCOUNTER — Ambulatory Visit (HOSPITAL_BASED_OUTPATIENT_CLINIC_OR_DEPARTMENT_OTHER): Payer: Self-pay | Admitting: Physical Therapy

## 2024-03-22 ENCOUNTER — Encounter (HOSPITAL_BASED_OUTPATIENT_CLINIC_OR_DEPARTMENT_OTHER): Payer: Self-pay | Admitting: Orthopaedic Surgery

## 2024-03-23 ENCOUNTER — Other Ambulatory Visit (HOSPITAL_BASED_OUTPATIENT_CLINIC_OR_DEPARTMENT_OTHER): Payer: Self-pay | Admitting: Orthopaedic Surgery

## 2024-03-23 ENCOUNTER — Telehealth: Payer: Self-pay | Admitting: Orthopaedic Surgery

## 2024-03-23 DIAGNOSIS — M5416 Radiculopathy, lumbar region: Secondary | ICD-10-CM

## 2024-03-23 NOTE — Telephone Encounter (Signed)
 Patient received a call about a referral for her back. She says she isn't having back problems. Would like a call from [facility].

## 2024-03-24 NOTE — Telephone Encounter (Signed)
 Returned call to patient explaining Dr. Verline Glow thought of the radiating pain potentially being from nerve entrapment and he wants Dr. Sulema Endo to assess the low back

## 2024-03-25 ENCOUNTER — Ambulatory Visit (HOSPITAL_BASED_OUTPATIENT_CLINIC_OR_DEPARTMENT_OTHER): Payer: Self-pay | Attending: Orthopaedic Surgery | Admitting: Physical Therapy

## 2024-03-25 ENCOUNTER — Telehealth: Payer: Self-pay | Admitting: Orthopaedic Surgery

## 2024-03-25 ENCOUNTER — Other Ambulatory Visit (HOSPITAL_BASED_OUTPATIENT_CLINIC_OR_DEPARTMENT_OTHER): Payer: Self-pay | Admitting: Orthopaedic Surgery

## 2024-03-25 ENCOUNTER — Other Ambulatory Visit (INDEPENDENT_AMBULATORY_CARE_PROVIDER_SITE_OTHER): Payer: Self-pay

## 2024-03-25 ENCOUNTER — Ambulatory Visit: Admitting: Orthopaedic Surgery

## 2024-03-25 ENCOUNTER — Telehealth (HOSPITAL_BASED_OUTPATIENT_CLINIC_OR_DEPARTMENT_OTHER): Payer: Self-pay | Admitting: Physical Therapy

## 2024-03-25 DIAGNOSIS — M25551 Pain in right hip: Secondary | ICD-10-CM | POA: Diagnosis not present

## 2024-03-25 DIAGNOSIS — M25851 Other specified joint disorders, right hip: Secondary | ICD-10-CM

## 2024-03-25 DIAGNOSIS — M5416 Radiculopathy, lumbar region: Secondary | ICD-10-CM

## 2024-03-25 MED ORDER — METHYLPREDNISOLONE 4 MG PO TABS: ORAL_TABLET | ORAL | 0 refills | Status: DC

## 2024-03-25 NOTE — Telephone Encounter (Signed)
 Patient says she can't do PT right now. She wants to continue water PT but not reg PT. She is still in too much pain to do regular PT.

## 2024-03-25 NOTE — Telephone Encounter (Signed)
 I called the patient to inform her that we needed to cancel her aquatic therapy appointment, on 03/25/24 at 5:00 pm,as she is overdue for a recertification. Insurance requires that patients receiving aquatic therapy be reassessed with a land-based evaluation in order for coverage to continue.  A land-based revaluation was offered for Mar 26, 2024, at 1:00 PM. The patient initially accepted the appointment but messaged shortly afterward to cancel. She stated that she had contacted Dr. Hermina Loosen because she was in significant pain and had nearly gone to the ER the previous night.  I followed up to confirm whether she would still be attending the May 2nd appointment. She replied that she was waiting to hear back from Dr. Hermina Loosen. Later, she sent another message stating that she would not be able to make the appointment and apologized for the cancellation.

## 2024-03-25 NOTE — Telephone Encounter (Signed)
 Referral to [facility] PT for onlt aquatic has been placed

## 2024-03-25 NOTE — Progress Notes (Signed)
 The patient is seen for a second opinion as it relates to her right hip.  She has significant right hip and groin pain and what she describes as a grinding sensation in her right hip.  In January of this year she was taken to the operating room appropriately by one of my colleagues for a right hip arthroscopy.  This was due to a significant anterior labral tear.  There was minimal arthritic changes in the patient's hip and it was appropriate to proceed with surgery given her symptomatic labral tear.  She does have some pre-existing back issues and some numbness and tingling issues but since surgery she reports more numbness around her anterior thigh and significant anterior thigh pain and groin pain.  She again describes a grinding sensation.  On exam there are no blocks or rotation on her right hip in terms of her hip moving it with internal and external rotation but it is painful.  I could not elicit a grinding sensation from my standpoint but she feels that which is certainly something to listen to in terms what the patient is describing.  There is some slight subjective decrease sensation around the lateral femoral cutaneous nerve distribution but I do not feel that there are signs consistent with any type of neuroma in this area.  There is no Tinel's sign.  She has had an intervention in her lumbar spine recently by Dr. Daisey Dryer due to previous low back issues and radicular symptoms but she said this has not helped with her hip.  She does walk with a limp as well.  An AP pelvis and lateral of the right hip today in the office show well-maintained joint space with no acute findings.  She understands this is a difficult situation.  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  I will put her on a steroid taper to see if this will calm down some acute inflammation  and we can still obtain a noncontrast MRI of the right hip manage assess for an effusion and any other worrisome findings related to the cartilage of the hip.  We can then see her back in follow-up as it relates to that MRI.

## 2024-03-26 ENCOUNTER — Ambulatory Visit (HOSPITAL_BASED_OUTPATIENT_CLINIC_OR_DEPARTMENT_OTHER): Admitting: Physical Therapy

## 2024-03-26 ENCOUNTER — Other Ambulatory Visit: Payer: Self-pay | Admitting: Radiology

## 2024-03-26 DIAGNOSIS — M25551 Pain in right hip: Secondary | ICD-10-CM

## 2024-03-29 ENCOUNTER — Encounter: Payer: Self-pay | Admitting: Orthopaedic Surgery

## 2024-03-29 ENCOUNTER — Telehealth: Payer: Self-pay | Admitting: Orthopaedic Surgery

## 2024-03-29 NOTE — Telephone Encounter (Signed)
 Patient says the Medrol  dose pack is causing her to have headaches.

## 2024-03-30 ENCOUNTER — Encounter: Payer: Self-pay | Admitting: Orthopaedic Surgery

## 2024-04-07 ENCOUNTER — Ambulatory Visit
Admission: RE | Admit: 2024-04-07 | Discharge: 2024-04-07 | Disposition: A | Discharge status: Home / Self Care | Source: Ambulatory Visit | Attending: Orthopaedic Surgery | Admitting: Orthopaedic Surgery

## 2024-04-07 DIAGNOSIS — M25551 Pain in right hip: Secondary | ICD-10-CM

## 2024-04-08 ENCOUNTER — Ambulatory Visit (HOSPITAL_BASED_OUTPATIENT_CLINIC_OR_DEPARTMENT_OTHER): Admitting: Orthopaedic Surgery

## 2024-04-09 ENCOUNTER — Other Ambulatory Visit

## 2024-04-13 ENCOUNTER — Ambulatory Visit (INDEPENDENT_AMBULATORY_CARE_PROVIDER_SITE_OTHER): Admitting: Orthopedic Surgery

## 2024-04-13 ENCOUNTER — Other Ambulatory Visit (INDEPENDENT_AMBULATORY_CARE_PROVIDER_SITE_OTHER)

## 2024-04-13 VITALS — BP 124/90 | HR 76 | Ht 65.0 in | Wt 180.0 lb

## 2024-04-13 DIAGNOSIS — M25551 Pain in right hip: Secondary | ICD-10-CM

## 2024-04-13 DIAGNOSIS — M5416 Radiculopathy, lumbar region: Secondary | ICD-10-CM

## 2024-04-13 NOTE — Progress Notes (Signed)
 Orthopedic Spine Surgery Office Note  Assessment: Patient is a 43 y.o. female with low back pain, throbbing type pain and paresthesias into the right lower extremity   Plan: -Explained that initially conservative treatment is tried as a significant number of patients may experience relief with these treatment modalities. Discussed that the conservative treatments include:  -activity modification  -physical therapy  -over the counter pain medications  -medrol  dosepak  -lumbar steroid injections -Patient has tried PT, Tylenol , Ibuprofen , Medrol  Dosepak, lumbar steroid injection - Since patient has tried over 6 weeks of conservative treatment consisting of Tylenol , lumbar steroid injection, and a Medrol  Dosepak, recommended a MRI of the lumbar spine to evaluate further -Patient should return to office in 4 weeks, x-rays at next visit: none   Patient expressed understanding of the plan and all questions were answered to the patient's satisfaction.   ___________________________________________________________________________   History:  Patient is a 43 y.o. female who presents today for lumbar spine.  Patient states that she had been having significant right hip pain that was interfering with her on a daily basis.  She underwent arthroscopic surgery with Dr. Hermina Loosen in January 2025.  She has had continued right groin pain after that surgery.  She said she feels a tightness and a pulling sensation in the groin.  She feels like she went back to work a little too soon after the surgery.  After the surgery, she developed numbness and paresthesias going down her leg.  She cannot pinpoint whether they are over the anterior, medial, lateral, or posterior aspect of the leg.  However, she knows that they go all the way down the leg.  She then feels them in the plantar aspect of her right foot.  She does not have any paresthesias or numbness in the left lower extremity.  She also describes a throbbing type  pain running down the leg which she also has a difficult time telling where exactly the leg goes except that it runs all the way down the leg.  She also has lower back pain that she feels in the lower lumbar region.  She still feels like there is swelling over the front of her hip.  She is trying to ice that area to help with that issue.  She recently took a Medrol  Dosepak from Dr. Lucienne Ryder but developed migraine so she stopped that.   Weakness: Denies Symptoms of imbalance: Denies Paresthesias and numbness: Yes, has numbness and paresthesias into the right lower extremity all the way to the plantar aspect of the foot.  No other numbness or paresthesias Bowel or bladder incontinence: Denies Saddle anesthesia: Denies  Treatments tried: PT, Tylenol , Ibuprofen , Medrol  Dosepak, lumbar steroid injection  Review of systems: Denies fevers and chills, night sweats, unexplained weight loss, history of cancer.  Has had pain that wakes her at night  Past medical history: HTN Anxiety Migraines  Allergies: oxycodone , hydrocodone , codeine, tramadol   Past surgical history:  Right hip arthroscopy  Social history: Denies use of nicotine product (smoking, vaping, patches, smokeless) Alcohol use: Denies Denies recreational drug use   Physical Exam:  BMI of 30.0  General: no acute distress, appears stated age Neurologic: alert, answering questions appropriately, following commands Respiratory: unlabored breathing on room air, symmetric chest rise Psychiatric: appropriate affect, normal cadence to speech   MSK (spine):  -Strength exam      Left  Right EHL    5/5  5/5 TA    5/5  5/5 GSC    5/5  5/5 Knee extension  5/5  5/5 Hip flexion   5/5  4/5  Strength testing limited by pain  -Sensory exam    Sensation intact to light touch in L3-S1 nerve distributions of bilateral lower extremities  -Achilles DTR: 2/4 on the left, 2/4 on the right -Patellar tendon DTR: 2/4 on the left, 2/4 on  the right  -Straight leg raise: Negative bilaterally -Clonus: no beats bilaterally  -Left hip exam: No pain through range of motion -Right hip exam: Pain with internal rotation and external rotation, positive FABER, negative SI joint compression test, negative SI joint distraction test, negative Gaenslen's  -TTP over the lower lumbar spine and right SI joint even with superficial palpation, withdraws and moans in pain with superficial palpation  Imaging: XRs of the lumbar spine from 04/13/2024 were independently reviewed and interpreted, showing disc height loss at L5/S1.  No other significant degenerative changes.  No evidence of instability on flexion/extension views.  No fracture or dislocation seen.   Patient name: Ann Taylor Patient MRN: [id number] Date of visit: 04/13/24   I spent 30 minutes in the room with the patient going over her history from before the hip surgery to now.  Spent some time teasing out exactly what where her symptoms were because she has several areas with different symptoms.  I also spent some time performing my exam and reviewing her x-rays with her.  I also spent time outside of the room reviewing her MRI from 2023.

## 2024-04-15 ENCOUNTER — Encounter: Payer: Self-pay | Admitting: Orthopedic Surgery

## 2024-04-16 ENCOUNTER — Telehealth: Payer: Self-pay

## 2024-04-16 NOTE — Telephone Encounter (Signed)
 Patient needing results of recent MRI scan of the hip. Are you able to call her with these results at [phone]

## 2024-04-21 ENCOUNTER — Ambulatory Visit (HOSPITAL_BASED_OUTPATIENT_CLINIC_OR_DEPARTMENT_OTHER): Admitting: Orthopaedic Surgery

## 2024-04-21 DIAGNOSIS — M25851 Other specified joint disorders, right hip: Secondary | ICD-10-CM

## 2024-04-21 NOTE — Progress Notes (Signed)
 Post Operative Evaluation    Procedure/Date of Surgery: Status post right hip pincer debridement 1/16  Interval History:   Presents status post the above procedure.  She did have an MRI with Dr. Lucienne Ryder and is here today for further discussion.  She has seen Dr. Sulema Endo who is pending an MRI of the lumbar spine.  She is continuing to have hip and groin pain.   PMH/PSH/Family History/Social History/Meds/Allergies:    Past Medical History:  Diagnosis Date   Arrhythmia    Headache(784.0)    Heart murmur    UTI (lower urinary tract infection)    Past Surgical History:  Procedure Laterality Date   BREAST BIOPSY Left 04/17/2023   MM LT BREAST BX W LOC DEV 1ST LESION IMAGE BX SPEC STEREO GUIDE 04/17/2023 [facility]   CESAREAN SECTION     Social History   Socioeconomic History   Marital status: Single    Spouse name: Not on file   Number of children: Not on file   Years of education: Not on file   Highest education level: Not on file  Occupational History   Not on file  Tobacco Use   Smoking status: Never   Smokeless tobacco: Never  Vaping Use   Vaping status: Never Used  Substance and Sexual Activity   Alcohol use: No   Drug use: No   Sexual activity: Yes    Birth control/protection: None  Other Topics Concern   Not on file  Social History Narrative   Not on file   Social Drivers of Health   Financial Resource Strain: At Risk (04/25/2023)   Received from [place], [place]   [employer]    Financial Resource Strain: 2  Food Insecurity: Not on file (04/25/2023)  Transportation Needs: Not at Risk (04/25/2023)   Received from [place], [employer] Needs    Transportation: 1  Physical Activity: Not on File (03/10/2023)   Received from [place], [place]   Physical Activity    Physical Activity: 0  Stress: Not on File (03/10/2023)   Received from [place], [place]   Stress    Stress: 0  Social Connections: Not on File  (07/29/2023)   Received from [employer]   Social Connections    Connectedness: 0   Family History  Problem Relation Age of Onset   Alcohol abuse Neg Hx    Arthritis Neg Hx    Asthma Neg Hx    Birth defects Neg Hx    Cancer Neg Hx    COPD Neg Hx    Depression Neg Hx    Diabetes Neg Hx    Drug abuse Neg Hx    Early death Neg Hx    Hearing loss Neg Hx    Heart disease Neg Hx    Hyperlipidemia Neg Hx    Hypertension Neg Hx    Kidney disease Neg Hx    Learning disabilities Neg Hx    Mental illness Neg Hx    Mental retardation Neg Hx    Miscarriages / Stillbirths Neg Hx    Stroke Neg Hx    Vision loss Neg Hx    Allergies  Allergen Reactions   Oxycodone  Hives and Itching   Shellfish Allergy Anaphylaxis   Shellfish-Derived Products Anaphylaxis   Grass Pollen(K-O-R-T-Swt Vern) Hives   Codone [Hydrocodone ]  Itching   Codeine Hives, Itching and Rash   Tramadol  Hives, Itching and Anxiety   Current Outpatient Medications  Medication Sig Dispense Refill   meloxicam  (MOBIC ) 15 MG tablet Take 1 tablet (15 mg total) by mouth daily. (Patient not taking: Reported on 02/26/2024) 30 tablet 1   cetirizine (ZYRTEC) 10 MG tablet Take 10 mg by mouth daily. Additional if needed for hives     diazepam  (VALIUM ) 5 MG tablet Take one tablet by mouth with food one hour prior to procedure. May repeat 30 minutes prior if needed. (Patient not taking: Reported on 02/26/2024) 2 tablet 0   gabapentin (NEURONTIN) 300 MG capsule Take 300 mg by mouth daily.     ibuprofen  (ADVIL ) 800 MG tablet Take 800 mg by mouth every 8 (eight) hours as needed for moderate pain (pain score 4-6) or mild pain (pain score 1-3). (Patient not taking: Reported on 02/26/2024)     lidocaine  (LIDODERM ) 5 % Place 1 patch onto the skin daily. Remove & Discard patch within 12 hours or as directed by MD (Patient not taking: Reported on 11/03/2023) 30 patch 0   methylPREDNISolone  (MEDROL ) 4 MG tablet Medrol  dose pack. Take as instructed 21 tablet 0    naproxen  (NAPROSYN ) 500 MG tablet Take 1 tablet (500 mg total) by mouth 2 (two) times daily. (Patient not taking: Reported on 02/26/2024) 30 tablet 0   triamterene -hydrochlorothiazide (DYAZIDE) 37.5-25 MG capsule Take 1 each (1 capsule total) by mouth daily. 15 capsule 0   No current facility-administered medications for this visit.   No results found.  Review of Systems:   A ROS was performed including pertinent positives and negatives as documented in the HPI.   Musculoskeletal Exam:    There were no vitals taken for this visit.  Right hip incisions are well-appearing without erythema or drainage.  Distal neurosensory exam is intact.  30 degrees internal/external rotation of the right hip without significant pain.  Walks with improved gait today.  Full right knee extension flexion strength with plantar and dorsiflexion strength with sensation intact to light touch in the L4-S1 distribution  Imaging:      I personally reviewed and interpreted the radiographs.   Assessment:   4 months status post right hip arthroscopy with pincer debridement overall continuing to improve.  She is pending a lumbar MRI with Dr. Sulema Endo.  I did discuss that her MRI to me does not show significant evidence of any type of  retear.  That being said she is still symptomatic in the groin.  At this time I would like her to finish her assessment with Dr. Sulema Endo for an MRI of the lumbar spine.  If there is some type of lumbar disc herniation that would need to be intervened upon I would recommend doing this prior to any type revision hip arthroscopy.  That being said I will plan on seeing her back in 1 month to check her progress Plan :    -Return to clinic 4 weeks      I personally saw and evaluated the patient, and participated in the management and treatment plan.  Wilhelmenia Harada, MD Attending Physician, Orthopedic Surgery  This document was dictated using Dragon voice recognition software. A reasonable  attempt at proof reading has been made to minimize errors.

## 2024-04-24 ENCOUNTER — Ambulatory Visit
Admission: RE | Admit: 2024-04-24 | Discharge: 2024-04-24 | Disposition: A | Discharge status: Home / Self Care | Source: Ambulatory Visit | Attending: Orthopedic Surgery | Admitting: Orthopedic Surgery

## 2024-04-24 DIAGNOSIS — M5416 Radiculopathy, lumbar region: Secondary | ICD-10-CM

## 2024-04-25 IMAGING — MR MR LUMBAR SPINE W/O CM
4 of 6 series · 24 of 48 positions shown · non-contrast
Comparison: None

CLINICAL DATA: Severe low back pain radiating to both hips,
buttocks and legs over the last year.

EXAM:
MRI LUMBAR SPINE WITHOUT CONTRAST
TECHNIQUE: Multiplanar, multisequence MR imaging of the lumbar spine was
performed. No intravenous contrast was administered.

[Series 3: T2 · sagittal · 4.0mm · 1.09mm/px · 4 of 15 slices shown (1 of 3)]
[im 1/15]
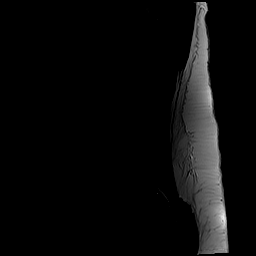
[im 5/15]
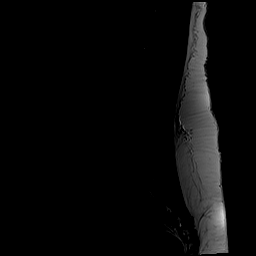
[im 10/15]
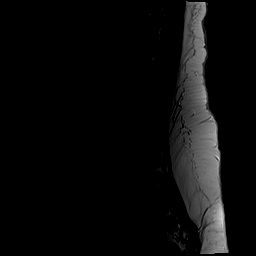
[im 15/15]
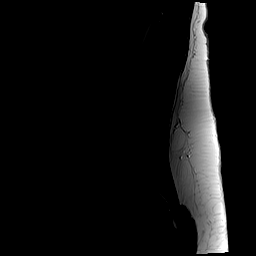

[Series 5: T1 · sagittal · 4.0mm · 1.09mm/px · 3 of 15 slices shown]
[im 1/15]
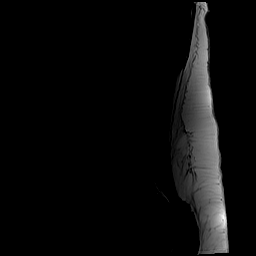
[im 10/15]
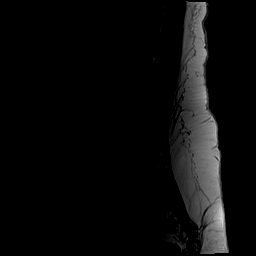
[im 15/15]
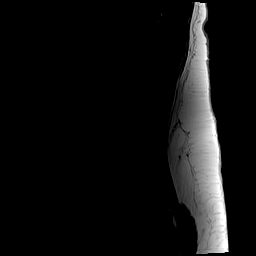

[Series 6: T2 · axial · 4.0mm · 0.39mm/px · z∈[-80,+149]mm · 9 of 41 slices shown (2 of 3)]
[im 1/41]
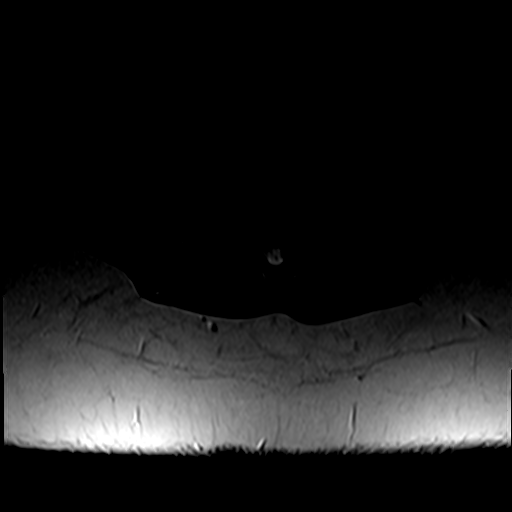
[im 8/41]
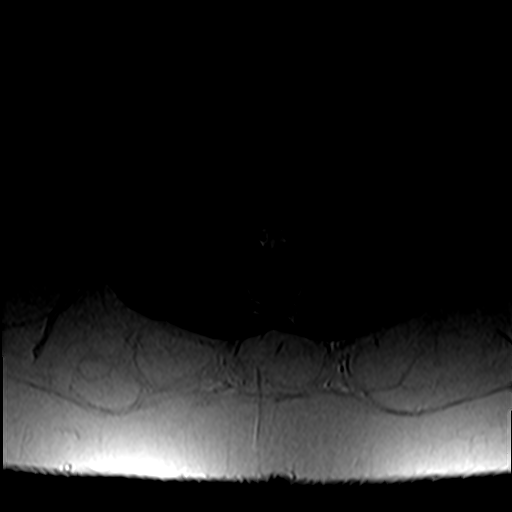
[im 11/41]
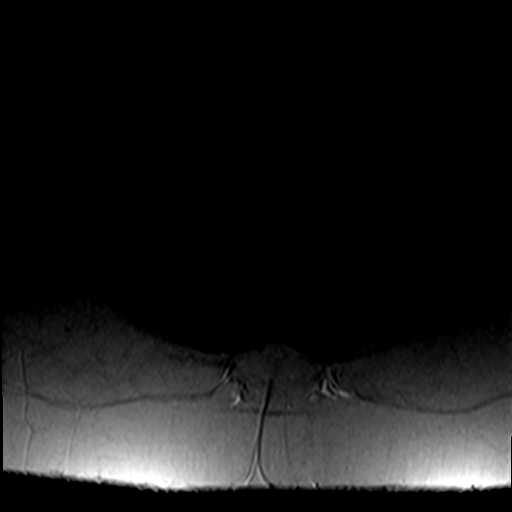
[im 19/41]
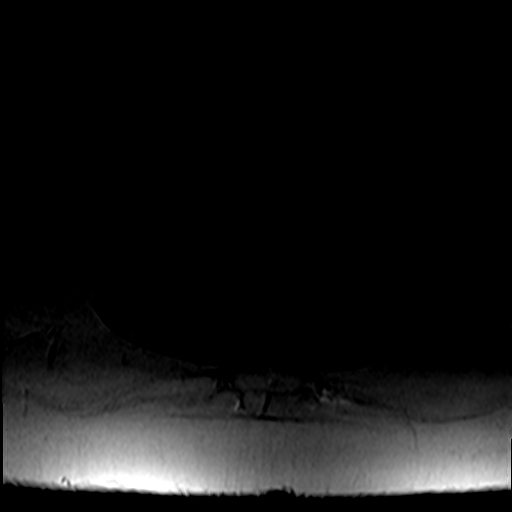
[im 22/41]
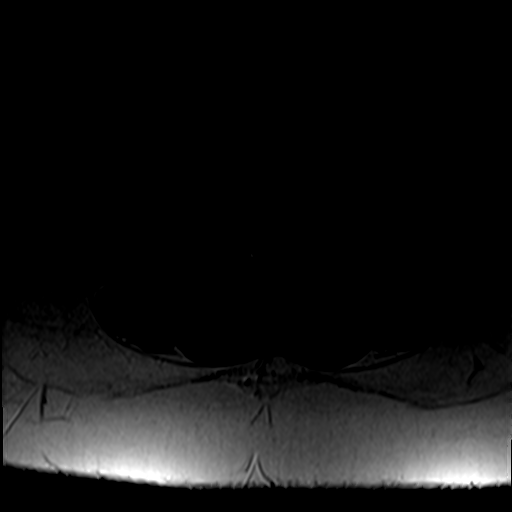
[im 30/41]
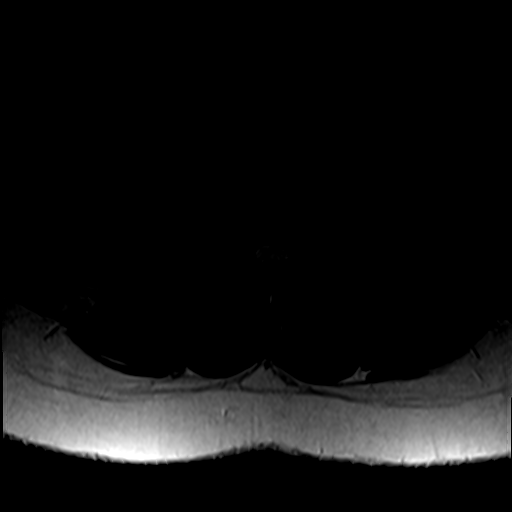
[im 33/41]
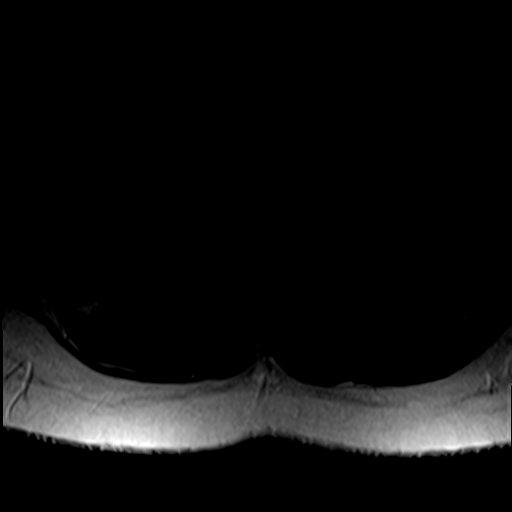
[im 37/41]
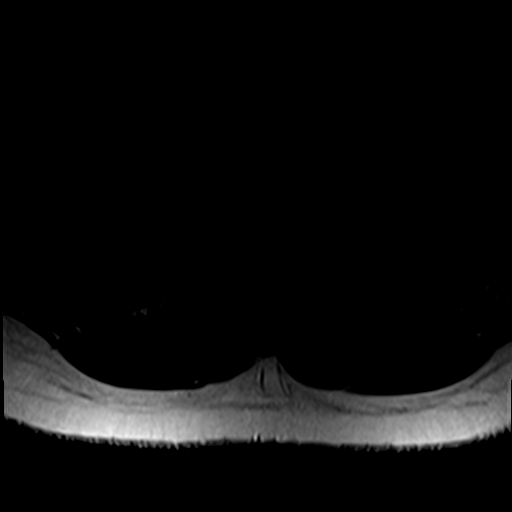
[im 41/41]
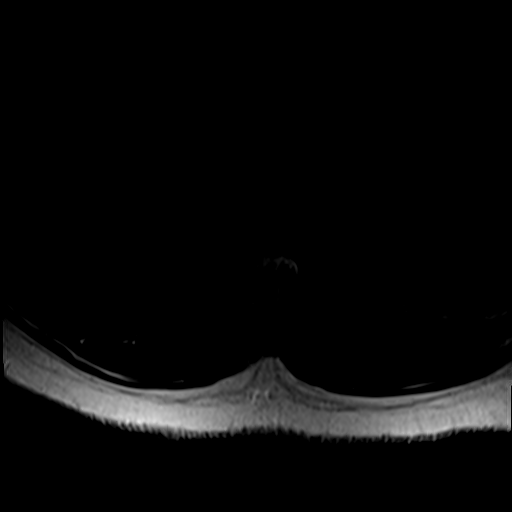

[Series 8: T2 · axial · 4.0mm · 0.39mm/px · z∈[-80,+130]mm · 8 of 41 slices shown (3 of 3)]
[im 1/41]
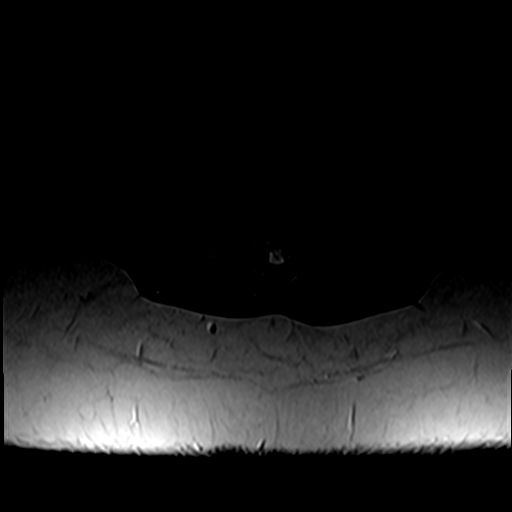
[im 8/41]
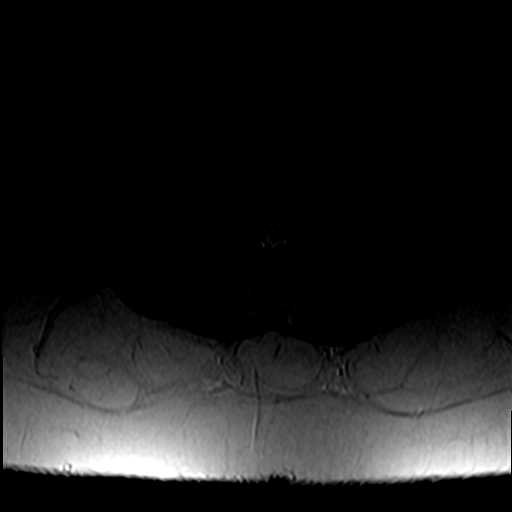
[im 11/41]
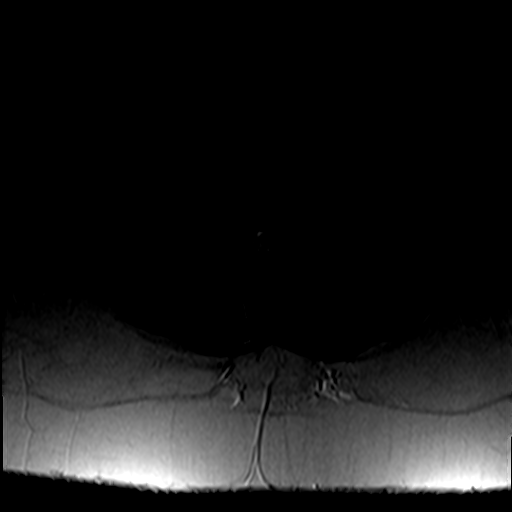
[im 19/41]
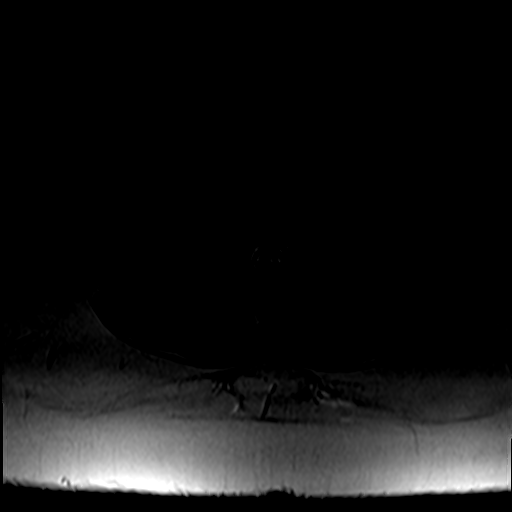
[im 22/41]
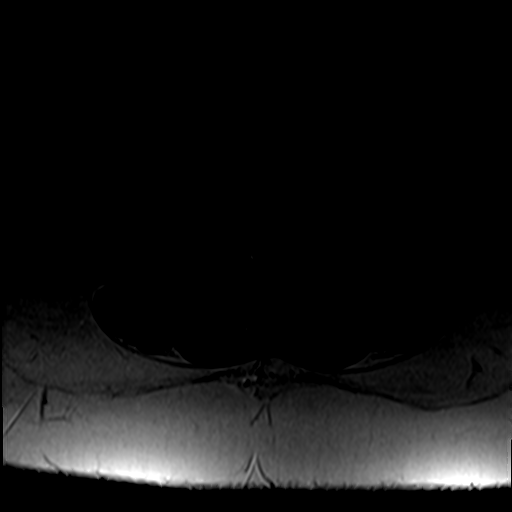
[im 30/41]
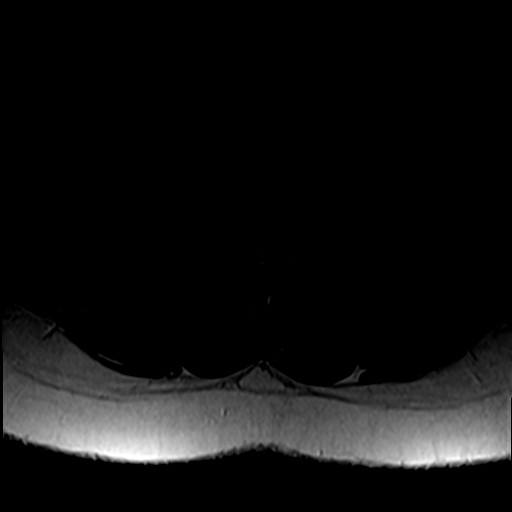
[im 33/41]
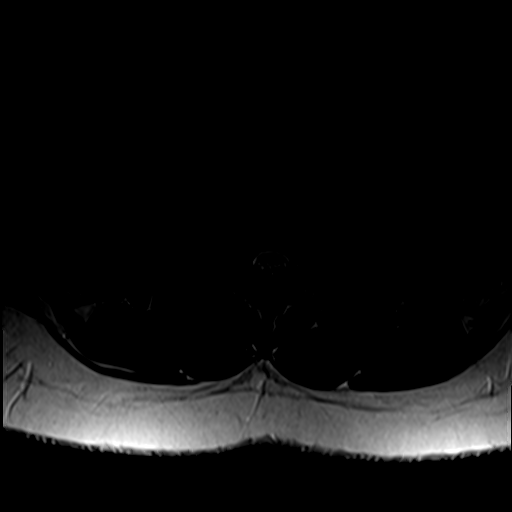
[im 37/41]
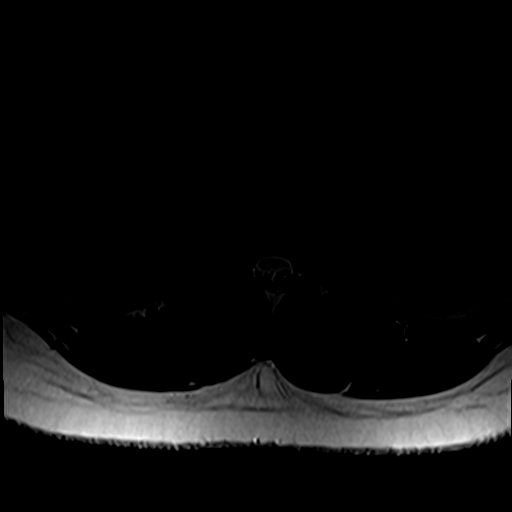

[24 of 48 positions shown; findings below may reference images not displayed]

FINDINGS: Segmentation:  5 lumbar type vertebral bodies.

Alignment:  Normal

Vertebrae:  Normal

Conus medullaris and cauda equina: Conus extends to the T12 level.
Conus and cauda equina appear normal.

Paraspinal and other soft tissues: Normal

Disc levels:

No abnormality at L4-5 or above.

At L5-S1, there is minimal bulging of the disc. No herniation or
stenosis.
IMPRESSION: Nearly normal examination. Minimal bulging and desiccation of the
L5-S1 disc. No stenosis or neural compression. The other levels are
normal.

## 2024-04-26 ENCOUNTER — Encounter (HOSPITAL_BASED_OUTPATIENT_CLINIC_OR_DEPARTMENT_OTHER): Payer: Self-pay | Admitting: Orthopaedic Surgery

## 2024-04-28 ENCOUNTER — Encounter: Admitting: Orthopedic Surgery

## 2024-04-29 ENCOUNTER — Ambulatory Visit: Admitting: Orthopedic Surgery

## 2024-05-03 ENCOUNTER — Ambulatory Visit (INDEPENDENT_AMBULATORY_CARE_PROVIDER_SITE_OTHER): Admitting: Orthopedic Surgery

## 2024-05-03 DIAGNOSIS — R1031 Right lower quadrant pain: Secondary | ICD-10-CM | POA: Diagnosis not present

## 2024-05-03 NOTE — Progress Notes (Addendum)
 Orthopedic Spine Surgery Office Note   Assessment: Patient is a 43 y.o. female with continued right hip and groin pain     Plan: -Patient has tried PT, Tylenol , Ibuprofen , Medrol  Dosepak, lumbar steroid injection -Her MRI shows disc desiccation and mild right L5/S1 foraminal stenosis but no other significant stenosis. Groin pain would be more typical for a T12 or L1 distribution and not L5 so I do not feel her symptoms are radicular in nature. She also did not get relief with a lumbar injection -Patient should return to office on an as needed basis     Patient expressed understanding of the plan and all questions were answered to the patient's satisfaction.    ___________________________________________________________________________     History:   Patient is a 43 y.o. female who presents today for follow up on her lumbar spine. Patient has not noticed any changes in her symptoms since she was last seen in the office. Her main issue she states today is her right hip and groin pain. No pain radiating down her leg. She sometimes has some numbness and paresthesias in her leg if she is sitting for too long with her leg dangling but no consistent numbness or paresthesias. She has not developed any new symptoms since she was last seen.    Treatments tried: PT, Tylenol , Ibuprofen , Medrol  Dosepak, lumbar steroid injection     Physical Exam:   General: no acute distress, appears stated age Neurologic: alert, answering questions appropriately, following commands Respiratory: unlabored breathing on room air, symmetric chest rise Psychiatric: appropriate affect, normal cadence to speech     MSK (spine):   -Strength exam                                                   Left                  Right EHL                              5/5                  5/5 TA                                 5/5                  5/5 GSC                             5/5                  5/5 Knee extension             5/5                  5/5 Hip flexion                    5/5                  4/5   Strength testing limited by pain   -Sensory exam  Sensation intact to light touch in L3-S1 nerve distributions of bilateral lower extremities   Imaging: XRs of the lumbar spine from 04/13/2024 were previously independently reviewed and interpreted, showing disc height loss at L5/S1.  No other significant degenerative changes.  No evidence of instability on flexion/extension views.  No fracture or dislocation seen.  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 No other stenosis seen. Disc desiccation at L5/S1.      Patient name: Ann Taylor Patient MRN: [id number] Date of visit: 05/03/24

## 2024-05-17 ENCOUNTER — Ambulatory Visit: Admitting: Orthopedic Surgery

## 2024-05-21 ENCOUNTER — Ambulatory Visit (HOSPITAL_BASED_OUTPATIENT_CLINIC_OR_DEPARTMENT_OTHER): Admitting: Orthopaedic Surgery

## 2024-05-21 ENCOUNTER — Other Ambulatory Visit (HOSPITAL_BASED_OUTPATIENT_CLINIC_OR_DEPARTMENT_OTHER): Payer: Self-pay

## 2024-05-21 DIAGNOSIS — R1031 Right lower quadrant pain: Secondary | ICD-10-CM | POA: Diagnosis not present

## 2024-05-21 MED ORDER — TRIAMCINOLONE ACETONIDE 40 MG/ML IJ SUSP
80.0000 mg | INTRAMUSCULAR | Status: AC | PRN
  Administered 2024-05-21: 80 mg via INTRA_ARTICULAR

## 2024-05-21 MED ORDER — LIDOCAINE HCL 1 % IJ SOLN
4.0000 mL | INTRAMUSCULAR | Status: AC | PRN
  Administered 2024-05-21: 4 mL

## 2024-05-21 NOTE — Progress Notes (Signed)
 Post Operative Evaluation    Procedure/Date of Surgery: Status post right hip pincer debridement 1/16  Interval History:   Presents status post the above procedure.  She is still having pain in the groin although her ambulation and weightbearing is improving.  She does feel weakness in the groin.  This has not improved over the last month.   PMH/PSH/Family History/Social History/Meds/Allergies:    Past Medical History:  Diagnosis Date   Arrhythmia    Headache(784.0)    Heart murmur    UTI (lower urinary tract infection)    Past Surgical History:  Procedure Laterality Date   BREAST BIOPSY Left 04/17/2023   MM LT BREAST BX W LOC DEV 1ST LESION IMAGE BX SPEC STEREO GUIDE 04/17/2023 [facility]   CESAREAN SECTION     Social History   Socioeconomic History   Marital status: Single    Spouse name: Not on file   Number of children: Not on file   Years of education: Not on file   Highest education level: Not on file  Occupational History   Not on file  Tobacco Use   Smoking status: Never   Smokeless tobacco: Never  Vaping Use   Vaping status: Never Used  Substance and Sexual Activity   Alcohol use: No   Drug use: No   Sexual activity: Yes    Birth control/protection: None  Other Topics Concern   Not on file  Social History Narrative   Not on file   Social Drivers of Health   Financial Resource Strain: At Risk (04/25/2023)   Received from [employer]    Financial Resource Strain: 2  Food Insecurity: Not on file (04/25/2023)  Transportation Needs: Not at Risk (04/25/2023)   Received from [employer] [facility]eds    Transportation: 1  Physical Activity: Not on File (03/10/2023)   Received from [facility]   Physical Activity    Physical Activity: 0  Stress: Not on File (03/10/2023)   Received from [facility]   Stress    Stress: 0  Social Connections: Not on File (07/29/2023)   Received from [employer]   Social  Connections    Connectedness: 0   Family History  Problem Relation Age of Onset   Alcohol abuse Neg Hx    Arthritis Neg Hx    Asthma Neg Hx    Birth defects Neg Hx    Cancer Neg Hx    COPD Neg Hx    Depression Neg Hx    Diabetes Neg Hx    Drug abuse Neg Hx    Early death Neg Hx    Hearing loss Neg Hx    Heart disease Neg Hx    Hyperlipidemia Neg Hx    Hypertension Neg Hx    Kidney disease Neg Hx    Learning disabilities Neg Hx    Mental illness Neg Hx    Mental retardation Neg Hx    Miscarriages / Stillbirths Neg Hx    Stroke Neg Hx    Vision loss Neg Hx    Allergies  Allergen Reactions   Oxycodone  Hives and Itching   Shellfish Allergy Anaphylaxis   Shellfish-Derived Products Anaphylaxis   Grass Pollen(K-O-R-T-Swt Vern) Hives   Codone [Hydrocodone ] Itching   Codeine Hives, Itching and Rash   Tramadol  Hives,  Itching and Anxiety   Current Outpatient Medications  Medication Sig Dispense Refill   meloxicam  (MOBIC ) 15 MG tablet Take 1 tablet (15 mg total) by mouth daily. (Patient not taking: Reported on 02/26/2024) 30 tablet 1   cetirizine (ZYRTEC) 10 MG tablet Take 10 mg by mouth daily. Additional if needed for hives     diazepam  (VALIUM ) 5 MG tablet Take one tablet by mouth with food one hour prior to procedure. May repeat 30 minutes prior if needed. (Patient not taking: Reported on 02/26/2024) 2 tablet 0   gabapentin (NEURONTIN) 300 MG capsule Take 300 mg by mouth daily.     ibuprofen  (ADVIL ) 800 MG tablet Take 800 mg by mouth every 8 (eight) hours as needed for moderate pain (pain score 4-6) or mild pain (pain score 1-3). (Patient not taking: Reported on 02/26/2024)     lidocaine  (LIDODERM ) 5 % Place 1 patch onto the skin daily. Remove & Discard patch within 12 hours or as directed by MD (Patient not taking: Reported on 11/03/2023) 30 patch 0   methylPREDNISolone  (MEDROL ) 4 MG tablet Medrol  dose pack. Take as instructed 21 tablet 0   naproxen  (NAPROSYN ) 500 MG tablet Take 1  tablet (500 mg total) by mouth 2 (two) times daily. (Patient not taking: Reported on 02/26/2024) 30 tablet 0   triamterene -hydrochlorothiazide (DYAZIDE) 37.5-25 MG capsule Take 1 each (1 capsule total) by mouth daily. 15 capsule 0   No current facility-administered medications for this visit.   No results found.  Review of Systems:   A ROS was performed including pertinent positives and negatives as documented in the HPI.   Musculoskeletal Exam:    There were no vitals taken for this visit.  Right hip incisions are well-appearing without erythema or drainage.  Distal neurosensory exam is intact.  30 degrees internal/external rotation of the right hip without significant pain.  Walks with improved gait today.  Full right knee extension flexion strength with plantar and dorsiflexion strength with sensation intact to light touch in the L4-S1 distribution  Imaging:      I personally reviewed and interpreted the radiographs.   Assessment:   Status post right hip arthroscopy with pincer debridement overall continuing to improve.  At this time I did recommend an ultrasound-guided injection of her hips that we can ascertain if residual instability is a component of her pain.  I will plan to see her back in 2 weeks for reassessment Plan :    - Right hip ultrasound-guided injection provided after verbal consent obtained     Procedure Note  Patient: Ann Taylor             Date of Birth: October 12, 1981           MRN: [id number]             Visit Date: 05/21/2024  Procedures: Visit Diagnoses:  1. Right groin pain     Large Joint Inj: R hip joint on 05/21/2024 3:28 PM Indications: pain Details: 22 G 3.5 in needle, ultrasound-guided anterolateral approach  Arthrogram: No  Medications: 4 mL lidocaine  1 %; 80 mg triamcinolone  acetonide 40 MG/ML Outcome: tolerated well, no immediate complications Procedure, treatment alternatives, risks and benefits explained, specific risks  discussed. Consent was given by the patient. Immediately prior to procedure a time out was called to verify the correct patient, procedure, equipment, support staff and site/side marked as required. Patient was prepped and draped in the usual sterile fashion.  I personally saw and evaluated the patient, and participated in the management and treatment plan.  Elspeth Parker, MD Attending Physician, Orthopedic Surgery  This document was dictated using Dragon voice recognition software. A reasonable attempt at proof reading has been made to minimize errors.

## 2024-05-24 ENCOUNTER — Encounter: Payer: Self-pay | Admitting: Family Medicine

## 2024-05-24 ENCOUNTER — Ambulatory Visit: Admitting: Family Medicine

## 2024-05-24 ENCOUNTER — Other Ambulatory Visit (HOSPITAL_COMMUNITY)
Admission: RE | Admit: 2024-05-24 | Discharge: 2024-05-24 | Disposition: A | Discharge status: Home / Self Care | Source: Ambulatory Visit | Attending: Family Medicine | Admitting: Family Medicine

## 2024-05-24 VITALS — BP 128/80 | HR 71 | Wt 182.6 lb

## 2024-05-24 DIAGNOSIS — N921 Excessive and frequent menstruation with irregular cycle: Secondary | ICD-10-CM

## 2024-05-24 MED ORDER — NAPROXEN 500 MG PO TABS: 500.0000 mg | ORAL_TABLET | Freq: Two times a day (BID) | ORAL | 0 refills | Status: DC

## 2024-05-24 NOTE — Progress Notes (Signed)
   PROBLEM VISIT Patient name: Ann Taylor MRN [id number]  Date of birth: [birth year] Chief Complaint:   No chief complaint on file.  History of Present Illness:   Ann Taylor is a 43 y.o. female here for complaint of irregular vaginal bleeding. Had Nexplanon  placed in April, has been experiencing recurrent bouts of vaginal bleeding -- has had VB for three weeks now. Had noticed that cramping and bleeding both better when she takes NSAIDs and worse without. Does not recall having irregular bleeding with prior Nexplanon .  Physical Assessment:   Vitals:   05/24/24 1602  BP: 128/80  Pulse: 71  Weight: 182 lb 9.6 oz (82.8 kg)  Body mass index is 30.39 kg/m.        Physical Examination:   General appearance - well appearing, and in no distress  Mental status - alert, oriented to person, place, and time  Psych:  She has a normal mood and affect  Skin - warm and dry, normal color, no suspicious lesions noted  Chest - effort normal  Heart - normal rate   Neck:  midline trachea, no thyromegaly or nodules  Extremities:  No swelling or varicosities noted  No results found for this or any previous visit (from the past 24 hours).  Assessment & Plan:  1) Vaginal bleeding: Discussed w patient that irregular bleeding is a common side effect of Nexplanon . Options to help control irregular bleeding include NSAIDs and COCs, however w pt's hx HTN and migraines, would avoid COCs. Rx for Naproxen  written. Also discussed alternative options for contraceptive management, including IUD. Patient will consider options and will return for visit if she desires to switch.   Orders Placed This Encounter  Procedures   POCT urine pregnancy    Meds:  Meds ordered this encounter  Medications   naproxen  (NAPROSYN ) 500 MG tablet    Sig: Take 1 tablet (500 mg total) by mouth 2 (two) times daily. Take with food    Dispense:  60 tablet    Refill:  0    Follow-up: Return in about 1 year (around  05/24/2025) for Annual or sooner for IUD placement.  Alain Sor, MD 05/25/2024 4:44 PM

## 2024-05-24 NOTE — Progress Notes (Unsigned)
 Been bleeding for three weeks irregularly. Nexplanon  inserted in April; had nexplanon  before, but lapsed for 5 months. Also having cramps not previously experienced.    Pt states her BP meds cause her headaches and lightheadedness.

## 2024-05-25 ENCOUNTER — Encounter: Payer: Self-pay | Admitting: Family Medicine

## 2024-05-27 LAB — CERVICOVAGINAL ANCILLARY ONLY
Bacterial Vaginitis (gardnerella): POSITIVE — AB
Candida Glabrata: NEGATIVE
Candida Vaginitis: POSITIVE — AB
Chlamydia: NEGATIVE
Comment: NEGATIVE
Comment: NEGATIVE
Comment: NEGATIVE
Comment: NEGATIVE
Comment: NEGATIVE
Comment: NORMAL
Neisseria Gonorrhea: NEGATIVE
Trichomonas: NEGATIVE

## 2024-05-27 MED ORDER — METRONIDAZOLE 500 MG PO TABS: 500.0000 mg | ORAL_TABLET | Freq: Two times a day (BID) | ORAL | 0 refills | Status: DC

## 2024-05-27 MED ORDER — FLUCONAZOLE 150 MG PO TABS: 150.0000 mg | ORAL_TABLET | Freq: Once | ORAL | 0 refills | Status: AC

## 2024-06-02 ENCOUNTER — Ambulatory Visit: Payer: Self-pay | Admitting: Family Medicine

## 2024-06-02 DIAGNOSIS — B3731 Acute candidiasis of vulva and vagina: Secondary | ICD-10-CM

## 2024-06-02 DIAGNOSIS — B9689 Other specified bacterial agents as the cause of diseases classified elsewhere: Secondary | ICD-10-CM

## 2024-06-02 MED ORDER — FLUCONAZOLE 150 MG PO TABS: 150.0000 mg | ORAL_TABLET | Freq: Once | ORAL | 0 refills | Status: AC

## 2024-06-04 ENCOUNTER — Telehealth (HOSPITAL_BASED_OUTPATIENT_CLINIC_OR_DEPARTMENT_OTHER): Payer: Self-pay | Admitting: Orthopaedic Surgery

## 2024-06-04 ENCOUNTER — Ambulatory Visit (HOSPITAL_BASED_OUTPATIENT_CLINIC_OR_DEPARTMENT_OTHER): Admitting: Orthopaedic Surgery

## 2024-06-04 NOTE — Telephone Encounter (Signed)
 Patient cx her appointment because injection did not work. Patient wants to know if she needs to follow up for anything else

## 2024-07-23 ENCOUNTER — Ambulatory Visit (INDEPENDENT_AMBULATORY_CARE_PROVIDER_SITE_OTHER): Admitting: Physician Assistant

## 2024-07-23 ENCOUNTER — Encounter: Payer: Self-pay | Admitting: Physician Assistant

## 2024-07-23 VITALS — BP 127/84 | HR 87 | Wt 180.4 lb

## 2024-07-23 DIAGNOSIS — N939 Abnormal uterine and vaginal bleeding, unspecified: Secondary | ICD-10-CM | POA: Diagnosis not present

## 2024-07-23 NOTE — Progress Notes (Signed)
 Pt presents for Nexplanon  removal.  Nexplanon  placed 02/2024 Pt c/o bleeding for 3 months and fatigue Pt considering IUD

## 2024-07-23 NOTE — Progress Notes (Signed)
    GYNECOLOGY OFFICE PROCEDURE NOTE  [name] is a 43 y.o. [id number] here for Nexplanon  removal. Last pap smear was on 03/27/23 and was normal.  No other gynecologic concerns.  Nexplanon  Removal Patient identified, informed consent performed, consent signed.   Appropriate time out taken.   Nexplanon  site identified.  Area prepped in usual sterile fashon. One ml of 1% lidocaine  was used to anesthetize the area at the distal end of the implant. A small stab incision was made right beside the implant on the distal portion.  The Nexplanon  rod was grasped using hemostats and removed without difficulty.  There was minimal blood loss. There were no complications.  3 ml of 1% lidocaine  was injected around the incision for post-procedure analgesia.  Steri-strips were applied over the small incision.  A pressure bandage was applied to reduce any bruising.    The patient tolerated the procedure well and was given post procedure instructions.  Patient is planning to use IUD for contraception. She was advised to have backup contraception for one week.     [place], PA-C 07/23/24

## 2024-07-24 ENCOUNTER — Encounter: Payer: Self-pay | Admitting: Physician Assistant

## 2024-07-24 LAB — CBC
Hematocrit: 44.7 % (ref 34.0–46.6)
Hemoglobin: 14 g/dL (ref 11.1–15.9)
MCH: 28.1 pg (ref 26.6–33.0)
MCHC: 31.3 g/dL — ABNORMAL LOW (ref 31.5–35.7)
MCV: 90 fL (ref 79–97)
Platelets: 338 x10E3/uL (ref 150–450)
RBC: 4.99 x10E6/uL (ref 3.77–5.28)
RDW: 13.5 % (ref 11.7–15.4)
WBC: 11.3 x10E3/uL — ABNORMAL HIGH (ref 3.4–10.8)

## 2024-07-28 ENCOUNTER — Ambulatory Visit: Payer: Self-pay | Admitting: Physician Assistant

## 2024-08-11 ENCOUNTER — Ambulatory Visit (HOSPITAL_BASED_OUTPATIENT_CLINIC_OR_DEPARTMENT_OTHER): Payer: Self-pay | Admitting: Orthopaedic Surgery

## 2024-08-11 ENCOUNTER — Encounter (HOSPITAL_BASED_OUTPATIENT_CLINIC_OR_DEPARTMENT_OTHER): Payer: Self-pay

## 2024-08-11 ENCOUNTER — Ambulatory Visit (INDEPENDENT_AMBULATORY_CARE_PROVIDER_SITE_OTHER): Admitting: Orthopaedic Surgery

## 2024-08-11 DIAGNOSIS — R1031 Right lower quadrant pain: Secondary | ICD-10-CM | POA: Diagnosis not present

## 2024-08-11 DIAGNOSIS — M25851 Other specified joint disorders, right hip: Secondary | ICD-10-CM

## 2024-08-11 NOTE — Progress Notes (Signed)
 Post Operative Evaluation    Procedure/Date of Surgery: Status post right hip pincer debridement 1/16  Interval History:   Presents status post the above procedure.  Overall she is still in persistent pain in the C-shaped distribution.  She has stalled in terms of progress with physical therapy.  She has not been able to work without pain.  She is here today for further discussion   PMH/PSH/Family History/Social History/Meds/Allergies:    Past Medical History:  Diagnosis Date   Arrhythmia    Headache(784.0)    Heart murmur    UTI (lower urinary tract infection)    Past Surgical History:  Procedure Laterality Date   BREAST BIOPSY Left 04/17/2023   MM LT BREAST BX W LOC DEV 1ST LESION IMAGE BX SPEC STEREO GUIDE 04/17/2023 [facility]   CESAREAN SECTION     HIP FRACTURE SURGERY Right 12/11/2023   Social History   Socioeconomic History   Marital status: Single    Spouse name: Not on file   Number of children: Not on file   Years of education: Not on file   Highest education level: Not on file  Occupational History   Not on file  Tobacco Use   Smoking status: Never   Smokeless tobacco: Never  Vaping Use   Vaping status: Never Used  Substance and Sexual Activity   Alcohol use: No   Drug use: No   Sexual activity: Yes    Birth control/protection: None  Other Topics Concern   Not on file  Social History Narrative   Not on file   Social Drivers of Health   Financial Resource Strain: At Risk (04/25/2023)   Received from [employer]    Financial Resource Strain: 2  Food Insecurity: Not on file (04/25/2023)  Transportation Needs: Not at Risk (04/25/2023)   Received from [employer] Needs    Transportation: 1  Physical Activity: Not on File (03/10/2023)   Received from [facility]   Physical Activity    Physical Activity: 0  Stress: Not on File (03/10/2023)   Received from [facility]   Stress     Stress: 0  Social Connections: Not on File (07/29/2023)   Received from [employer]   Social Connections    Connectedness: 0   Family History  Problem Relation Age of Onset   Alcohol abuse Neg Hx    Arthritis Neg Hx    Asthma Neg Hx    Birth defects Neg Hx    Cancer Neg Hx    COPD Neg Hx    Depression Neg Hx    Diabetes Neg Hx    Drug abuse Neg Hx    Early death Neg Hx    Hearing loss Neg Hx    Heart disease Neg Hx    Hyperlipidemia Neg Hx    Hypertension Neg Hx    Kidney disease Neg Hx    Learning disabilities Neg Hx    Mental illness Neg Hx    Mental retardation Neg Hx    Miscarriages / Stillbirths Neg Hx    Stroke Neg Hx    Vision loss Neg Hx    Allergies  Allergen Reactions   Oxycodone  Hives and Itching   Shellfish Allergy Anaphylaxis   Shellfish-Derived Products Anaphylaxis   Grass Pollen(K-O-R-T-Swt Vern) Hives  Codone [Hydrocodone ] Itching   Codeine Hives, Itching and Rash   Tramadol  Hives, Itching and Anxiety   Current Outpatient Medications  Medication Sig Dispense Refill   gabapentin (NEURONTIN) 300 MG capsule Take 300 mg by mouth daily. (Patient not taking: Reported on 07/23/2024)     metroNIDAZOLE  (FLAGYL ) 500 MG tablet Take 1 tablet (500 mg total) by mouth 2 (two) times daily. (Patient not taking: Reported on 07/23/2024) 14 tablet 0   naproxen  (NAPROSYN ) 500 MG tablet Take 1 tablet (500 mg total) by mouth 2 (two) times daily. Take with food 60 tablet 0   triamterene -hydrochlorothiazide (DYAZIDE) 37.5-25 MG capsule Take 1 each (1 capsule total) by mouth daily. 15 capsule 0   No current facility-administered medications for this visit.   No results found.  Review of Systems:   A ROS was performed including pertinent positives and negatives as documented in the HPI.   Musculoskeletal Exam:    There were no vitals taken for this visit.  Right hip incisions are well-appearing without erythema or drainage.  Distal neurosensory exam is intact.  30 degrees  internal/external rotation of the right hip without significant pain.  Walks with improved gait today.  Full right knee extension flexion strength with plantar and dorsiflexion strength with sensation intact to light touch in the L4-S1 distribution  Imaging:      I personally reviewed and interpreted the radiographs.   Assessment:   Status post right hip arthroscopy with pincer debridement now with persistent right hip and groin pain.  Unfortunately she despite having aggressive physical therapy has plateaued.  She has not gotten lasting relief from her injection.  Given this I did discuss that given the pincer debridement there was not significant labral tissue remaining and as a result we did discuss the possibility of labral reconstruction.  I did discuss risk limitations as well as associated recovery timeframe.  After discussion she would ultimately like to proceed Plan :    - Plan for right hip arthroscopy with labral reconstruction   After a lengthy discussion of treatment options, including risks, benefits, alternatives, complications of surgical and nonsurgical conservative options, the patient elected surgical repair.   The patient  is aware of the material risks  and complications including, but not limited to injury to adjacent structures, neurovascular injury, infection, numbness, bleeding, implant failure, thermal burns, stiffness, persistent pain, failure to heal, disease transmission from allograft, need for further surgery, dislocation, anesthetic risks, blood clots, risks of death,and others. The probabilities of surgical success and failure discussed with patient given their particular co-morbidities.The time and nature of expected rehabilitation and recovery was discussed.The patient's questions were all answered preoperatively.  No barriers to understanding were noted. I explained the natural history of the disease process and Rx rationale.  I explained to the patient what I  considered to be reasonable expectations given their personal situation.  The final treatment plan was arrived at through a shared patient decision making process model.     I personally saw and evaluated the patient, and participated in the management and treatment plan.  Elspeth Parker, MD Attending Physician, Orthopedic Surgery  This document was dictated using Dragon voice recognition software. A reasonable attempt at proof reading has been made to minimize errors.

## 2024-09-01 ENCOUNTER — Other Ambulatory Visit: Payer: Self-pay | Admitting: Family

## 2024-09-01 DIAGNOSIS — Z1231 Encounter for screening mammogram for malignant neoplasm of breast: Secondary | ICD-10-CM

## 2024-09-20 ENCOUNTER — Ambulatory Visit (HOSPITAL_COMMUNITY)
Admission: EM | Admit: 2024-09-20 | Discharge: 2024-09-20 | Disposition: A | Discharge status: Home / Self Care | Attending: Family Medicine | Admitting: Family Medicine

## 2024-09-20 ENCOUNTER — Other Ambulatory Visit: Payer: Self-pay

## 2024-09-20 ENCOUNTER — Encounter (HOSPITAL_COMMUNITY): Payer: Self-pay | Admitting: Emergency Medicine

## 2024-09-20 DIAGNOSIS — R030 Elevated blood-pressure reading, without diagnosis of hypertension: Secondary | ICD-10-CM | POA: Diagnosis not present

## 2024-09-20 DIAGNOSIS — G43C1 Periodic headache syndromes in child or adult, intractable: Secondary | ICD-10-CM | POA: Diagnosis not present

## 2024-09-20 MED ORDER — KETOROLAC TROMETHAMINE 30 MG/ML IJ SOLN
30.0000 mg | Freq: Once | INTRAMUSCULAR | Status: AC
  Administered 2024-09-20: 30 mg via INTRAMUSCULAR

## 2024-09-20 MED ORDER — KETOROLAC TROMETHAMINE 30 MG/ML IJ SOLN
INTRAMUSCULAR | Status: AC
  Filled 2024-09-20: qty 1

## 2024-09-20 MED ORDER — SUMATRIPTAN SUCCINATE 6 MG/0.5ML ~~LOC~~ SOLN
SUBCUTANEOUS | Status: AC
  Filled 2024-09-20: qty 0.5

## 2024-09-20 MED ORDER — SUMATRIPTAN SUCCINATE 6 MG/0.5ML ~~LOC~~ SOLN
6.0000 mg | Freq: Once | SUBCUTANEOUS | Status: AC
  Administered 2024-09-20: 6 mg via SUBCUTANEOUS

## 2024-09-20 NOTE — ED Notes (Signed)
 Reviewed work note

## 2024-09-20 NOTE — ED Triage Notes (Signed)
 155/108, the lowest was 140/105.  This morning at work 160/110.  Patient states she has been checking blood pressure over the week end for every hour.    Patient has headache and just not feeling well.  Complains of dizziness  Reports this headache is like her migraines, but will not stop.  Has taken ibuprofen .    Pcp told patient to stop bp med due to bp running low.  This instruction was one month ago.  Was instructed to take 1/2 bp medicine if blood pressure went up.  Last night, patient did take only 1/2 pill, but did not see any improvement.  Took 1 whole pill this morning-around 7:00am.

## 2024-09-20 NOTE — Discharge Instructions (Signed)
 You have been given a shot of Toradol  30 mg and sumatriptan 6 mg today.  Continue to check your blood pressure some at home.  Please follow-up with your primary care

## 2024-09-20 NOTE — ED Provider Notes (Signed)
 [facility]    CSN: [id number] Arrival date & time: 09/20/24  9165      History   Chief Complaint Chief Complaint  Patient presents with   Hypertension    HPI Ann Taylor is a 43 y.o. female.    Hypertension  Here for headache that has been bothering her since October 24. Headache is over her left frontal area and eye and is throbbing.  She has photophobia and nausea.  She has been checking her blood pressure very often while this headache has been bothering her.  It has been elevated in the 150s over 100s.  In mid September she had been taken off her triamterene  HCTZ combo pill because her blood pressure was 107 unmedicated.  She had been having dizziness when she did take the pill.  She ended up taking a half of a blood pressure pill last night and a whole 1 this morning.  Blood pressure at triage today is 128/94.  She is allergic to hydrocodone  and oxycodone  and tramadol  and shellfish. Last menstrual cycle was October 6.  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 Authorizing Provider  triamterene -hydrochlorothiazide (DYAZIDE) 37.5-25 MG capsule Take 1 each (1 capsule total) by mouth daily. 09/24/22  Yes Constant, Peggy, MD    Family History Family History  Problem Relation Age of Onset   Alcohol abuse Neg Hx    Arthritis Neg Hx    Asthma Neg Hx    Birth defects Neg Hx    Cancer Neg Hx    COPD Neg Hx    Depression Neg Hx    Diabetes Neg Hx    Drug abuse Neg Hx    Early death Neg Hx    Hearing loss Neg Hx    Heart disease Neg Hx    Hyperlipidemia Neg Hx    Hypertension Neg Hx    Kidney disease Neg Hx    Learning disabilities Neg Hx    Mental illness Neg Hx    Mental retardation Neg Hx    Miscarriages /  Stillbirths Neg Hx    Stroke Neg Hx    Vision loss Neg Hx     Social History Social History   Tobacco Use   Smoking status: Never   Smokeless tobacco: Never  Vaping Use   Vaping status: Never Used  Substance Use Topics   Alcohol use: No   Drug use: No     Allergies   Oxycodone , Shellfish allergy, Shellfish protein-containing drug products, Grass pollen(k-o-r-t-swt vern), Codone [hydrocodone ], Codeine, and Tramadol    Review of Systems Review of Systems   Physical Exam Triage Vital Signs ED Triage Vitals  Encounter Vitals Group     BP 09/20/24 0846 (!) 128/94     Girls Systolic BP Percentile --      Girls Diastolic BP Percentile --      Boys Systolic BP Percentile --      Boys Diastolic BP Percentile --      Pulse Rate 09/20/24 0846 96     Resp 09/20/24 0846 16     Temp 09/20/24 0846 97.9 F (36.6 C)     Temp Source 09/20/24 0846 Oral     SpO2 09/20/24 0846 99 %     Weight --      Height --      Head Circumference --      Peak Flow --      Pain Score 09/20/24 0841 10     Pain Loc --      Pain Education --      Exclude  from Growth Chart --    No data found.  Updated Vital Signs BP (!) 128/94 (BP Location: Right Arm) Comment (BP Location): large cuff  Pulse 96   Temp 97.9 F (36.6 C) (Oral)   Resp 16   LMP 08/30/2024 (Approximate)   SpO2 99%   Visual Acuity Right Eye Distance:   Left Eye Distance:   Bilateral Distance:    Right Eye Near:   Left Eye Near:    Bilateral Near:     Physical Exam Vitals reviewed.  Constitutional:      General: She is not in acute distress.    Appearance: She is not ill-appearing, toxic-appearing or diaphoretic.  HENT:     Mouth/Throat:     Mouth: Mucous membranes are moist.  Eyes:     Extraocular Movements: Extraocular movements intact.     Conjunctiva/sclera: Conjunctivae normal.     Pupils: Pupils are equal, round, and reactive to light.  Cardiovascular:     Rate and Rhythm: Normal rate and regular rhythm.     Heart sounds: No murmur heard. Pulmonary:     Effort: Pulmonary effort is normal.     Breath sounds: Normal breath sounds.  Musculoskeletal:     Cervical back: Neck supple.  Lymphadenopathy:     Cervical: No cervical adenopathy.  Skin:    Coloration: Skin is not jaundiced or pale.  Neurological:     General: No focal deficit present.     Mental Status: She is alert and oriented to person, place, and time.  Psychiatric:        Behavior: Behavior normal.      UC Treatments / Results  Labs (all labs ordered are listed, but only abnormal results are displayed) Labs Reviewed - No data to display  EKG   Radiology No results found.  Procedures Procedures (including critical care time)  Medications Ordered in UC Medications  ketorolac  (TORADOL ) 30 MG/ML injection 30 mg (has no administration in time range)  SUMAtriptan (IMITREX) injection 6 mg (has no administration in time range)    Initial Impression / Assessment and Plan / UC Course  I have reviewed the triage vital signs and the nursing notes.  Pertinent labs & imaging  results that were available during my care of the patient were reviewed by me and considered in my medical decision making (see chart for details).     Toradol  and sumatriptan are given IM here.  I have asked her to follow-up with her primary care  I have discussed with her that I think most likely her blood pressures were so elevated due to the pain and the migraine, but she will continue to check them at home.  Final Clinical Impressions(s) / UC Diagnoses   Final diagnoses:  Intractable periodic headache syndrome  Elevated blood pressure reading     Discharge Instructions      You have been given a shot of Toradol  30 mg and sumatriptan 6 mg today.  Continue to check your blood pressure some at home.  Please follow-up with your primary care      ED Prescriptions   None    PDMP not reviewed this encounter.   Vonna Sharlet POUR, MD 09/20/24 [phone]

## 2024-09-27 ENCOUNTER — Encounter: Payer: Self-pay | Admitting: Radiology

## 2024-10-01 ENCOUNTER — Ambulatory Visit
Admission: RE | Admit: 2024-10-01 | Discharge: 2024-10-01 | Disposition: A | Discharge status: Home / Self Care | Source: Ambulatory Visit | Attending: Family | Admitting: Family

## 2024-10-01 DIAGNOSIS — Z1231 Encounter for screening mammogram for malignant neoplasm of breast: Secondary | ICD-10-CM

## 2024-10-26 ENCOUNTER — Telehealth: Payer: Self-pay | Admitting: Orthopaedic Surgery

## 2024-10-26 NOTE — Telephone Encounter (Signed)
 Patient needs a letter stating the date she will be having surgery and how long she is expected to be out of work.

## 2024-10-27 ENCOUNTER — Encounter (HOSPITAL_BASED_OUTPATIENT_CLINIC_OR_DEPARTMENT_OTHER): Payer: Self-pay | Admitting: Orthopaedic Surgery

## 2024-11-08 ENCOUNTER — Other Ambulatory Visit: Payer: Self-pay

## 2024-11-08 ENCOUNTER — Encounter (HOSPITAL_BASED_OUTPATIENT_CLINIC_OR_DEPARTMENT_OTHER): Payer: Self-pay | Admitting: Orthopaedic Surgery

## 2024-11-10 ENCOUNTER — Encounter (HOSPITAL_BASED_OUTPATIENT_CLINIC_OR_DEPARTMENT_OTHER)
Admission: RE | Admit: 2024-11-10 | Discharge: 2024-11-10 | Disposition: A | Discharge status: Home / Self Care | Source: Ambulatory Visit | Attending: Orthopaedic Surgery | Admitting: Orthopaedic Surgery

## 2024-11-10 DIAGNOSIS — I10 Essential (primary) hypertension: Secondary | ICD-10-CM | POA: Insufficient documentation

## 2024-11-10 DIAGNOSIS — Z01818 Encounter for other preprocedural examination: Secondary | ICD-10-CM | POA: Diagnosis present

## 2024-11-10 LAB — BASIC METABOLIC PANEL WITH GFR
Anion gap: 11 (ref 5–15)
BUN: 12 mg/dL (ref 6–20)
CO2: 24 mmol/L (ref 22–32)
Calcium: 9.1 mg/dL (ref 8.9–10.3)
Chloride: 102 mmol/L (ref 98–111)
Creatinine, Ser: 0.79 mg/dL (ref 0.44–1.00)
GFR, Estimated: >60 mL/min (ref >60)
Glucose, Bld: 109 mg/dL — ABNORMAL HIGH (ref 70–99)
Potassium: 3.2 mmol/L — ABNORMAL LOW (ref 3.5–5.1)
Sodium: 138 mmol/L (ref 135–145)

## 2024-11-10 NOTE — Progress Notes (Signed)

## 2024-11-15 ENCOUNTER — Ambulatory Visit (HOSPITAL_BASED_OUTPATIENT_CLINIC_OR_DEPARTMENT_OTHER): Admitting: Anesthesiology

## 2024-11-15 ENCOUNTER — Other Ambulatory Visit: Payer: Self-pay

## 2024-11-15 ENCOUNTER — Encounter (HOSPITAL_BASED_OUTPATIENT_CLINIC_OR_DEPARTMENT_OTHER): Payer: Self-pay | Admitting: Orthopaedic Surgery

## 2024-11-15 ENCOUNTER — Ambulatory Visit (HOSPITAL_BASED_OUTPATIENT_CLINIC_OR_DEPARTMENT_OTHER)
Admission: RE | Admit: 2024-11-15 | Discharge: 2024-11-15 | Disposition: A | Discharge status: Home / Self Care | Attending: Orthopaedic Surgery | Admitting: Orthopaedic Surgery

## 2024-11-15 ENCOUNTER — Encounter (HOSPITAL_BASED_OUTPATIENT_CLINIC_OR_DEPARTMENT_OTHER): Admission: RE | Disposition: A | Payer: Self-pay | Attending: Orthopaedic Surgery

## 2024-11-15 ENCOUNTER — Ambulatory Visit (HOSPITAL_COMMUNITY)

## 2024-11-15 DIAGNOSIS — S73191A Other sprain of right hip, initial encounter: Secondary | ICD-10-CM | POA: Diagnosis present

## 2024-11-15 DIAGNOSIS — I10 Essential (primary) hypertension: Secondary | ICD-10-CM | POA: Insufficient documentation

## 2024-11-15 DIAGNOSIS — M25851 Other specified joint disorders, right hip: Secondary | ICD-10-CM

## 2024-11-15 DIAGNOSIS — Z01818 Encounter for other preprocedural examination: Secondary | ICD-10-CM

## 2024-11-15 DIAGNOSIS — X58XXXA Exposure to other specified factors, initial encounter: Secondary | ICD-10-CM | POA: Insufficient documentation

## 2024-11-15 DIAGNOSIS — K219 Gastro-esophageal reflux disease without esophagitis: Secondary | ICD-10-CM | POA: Diagnosis not present

## 2024-11-15 LAB — POCT PREGNANCY, URINE: Preg Test, Ur: NEGATIVE

## 2024-11-15 SURGERY — ARTHROSCOPY, HIP, WITH LABRUM REPAIR
Anesthesia: General | Laterality: Right

## 2024-11-15 MED ORDER — ACETAMINOPHEN 10 MG/ML IV SOLN
INTRAVENOUS | Status: AC
  Filled 2024-11-15: qty 100

## 2024-11-15 MED ORDER — IBUPROFEN 800 MG PO TABS: 800.0000 mg | ORAL_TABLET | Freq: Three times a day (TID) | ORAL | 0 refills | Status: AC

## 2024-11-15 MED ORDER — ACETAMINOPHEN 10 MG/ML IV SOLN
1000.0000 mg | Freq: Once | INTRAVENOUS | Status: AC
  Administered 2024-11-15: 1000 mg via INTRAVENOUS

## 2024-11-15 MED ORDER — GABAPENTIN 300 MG PO CAPS
300.0000 mg | ORAL_CAPSULE | Freq: Once | ORAL | Status: AC
  Administered 2024-11-15: 300 mg via ORAL

## 2024-11-15 MED ORDER — GABAPENTIN 300 MG PO CAPS
ORAL_CAPSULE | ORAL | Status: AC
  Filled 2024-11-15: qty 1

## 2024-11-15 MED ORDER — MIDAZOLAM HCL 5 MG/5ML IJ SOLN
INTRAMUSCULAR | Status: DC | PRN
  Administered 2024-11-15: 2 mg via INTRAVENOUS

## 2024-11-15 MED ORDER — SUGAMMADEX SODIUM 200 MG/2ML IV SOLN
INTRAVENOUS | Status: DC | PRN
  Administered 2024-11-15: 200 mg via INTRAVENOUS

## 2024-11-15 MED ORDER — LACTATED RINGERS IV SOLN: INTRAVENOUS | Status: DC

## 2024-11-15 MED ORDER — ONDANSETRON HCL 4 MG/2ML IJ SOLN
INTRAMUSCULAR | Status: AC
  Filled 2024-11-15: qty 2

## 2024-11-15 MED ORDER — BUPIVACAINE HCL (PF) 0.25 % IJ SOLN
INTRAMUSCULAR | Status: AC
  Filled 2024-11-15: qty 30

## 2024-11-15 MED ORDER — PHENYLEPHRINE 80 MCG/ML (10ML) SYRINGE FOR IV PUSH (FOR BLOOD PRESSURE SUPPORT)
PREFILLED_SYRINGE | INTRAVENOUS | Status: DC | PRN
  Administered 2024-11-15: 80 ug via INTRAVENOUS

## 2024-11-15 MED ORDER — TRANEXAMIC ACID-NACL 1000-0.7 MG/100ML-% IV SOLN
INTRAVENOUS | Status: AC
  Filled 2024-11-15: qty 100

## 2024-11-15 MED ORDER — CEFAZOLIN SODIUM-DEXTROSE 2-4 GM/100ML-% IV SOLN
INTRAVENOUS | Status: AC
  Filled 2024-11-15: qty 100

## 2024-11-15 MED ORDER — PROPOFOL 500 MG/50ML IV EMUL
INTRAVENOUS | Status: DC | PRN
  Administered 2024-11-15: 125 ug/kg/min via INTRAVENOUS

## 2024-11-15 MED ORDER — HYDROMORPHONE HCL 1 MG/ML IJ SOLN
INTRAMUSCULAR | Status: DC | PRN
  Administered 2024-11-15: .5 mg via INTRAVENOUS

## 2024-11-15 MED ORDER — PHENYLEPHRINE 80 MCG/ML (10ML) SYRINGE FOR IV PUSH (FOR BLOOD PRESSURE SUPPORT)
PREFILLED_SYRINGE | INTRAVENOUS | Status: AC
  Filled 2024-11-15: qty 10

## 2024-11-15 MED ORDER — OXYCODONE HCL 5 MG PO TABS
ORAL_TABLET | ORAL | Status: AC
  Filled 2024-11-15: qty 1

## 2024-11-15 MED ORDER — OXYCODONE HCL 5 MG PO TABS
5.0000 mg | ORAL_TABLET | Freq: Once | ORAL | Status: AC | PRN
  Administered 2024-11-15: 5 mg via ORAL

## 2024-11-15 MED ORDER — FENTANYL CITRATE (PF) 100 MCG/2ML IJ SOLN
INTRAMUSCULAR | Status: DC | PRN
  Administered 2024-11-15: 25 ug via INTRAVENOUS
  Administered 2024-11-15: 50 ug via INTRAVENOUS
  Administered 2024-11-15: 25 ug via INTRAVENOUS

## 2024-11-15 MED ORDER — LIDOCAINE 2% (20 MG/ML) 5 ML SYRINGE
INTRAMUSCULAR | Status: AC
  Filled 2024-11-15: qty 5

## 2024-11-15 MED ORDER — ACETAMINOPHEN 500 MG PO TABS: 500.0000 mg | ORAL_TABLET | Freq: Three times a day (TID) | ORAL | 0 refills | Status: AC

## 2024-11-15 MED ORDER — ROCURONIUM 10MG/ML (10ML) SYRINGE FOR MEDFUSION PUMP - OPTIME
INTRAVENOUS | Status: DC | PRN
  Administered 2024-11-15: 50 mg via INTRAVENOUS
  Administered 2024-11-15: 20 mg via INTRAVENOUS

## 2024-11-15 MED ORDER — LIDOCAINE 2% (20 MG/ML) 5 ML SYRINGE
INTRAMUSCULAR | Status: DC | PRN
  Administered 2024-11-15: 60 mg via INTRAVENOUS

## 2024-11-15 MED ORDER — ONDANSETRON HCL 4 MG/2ML IJ SOLN: 4.0000 mg | Freq: Once | INTRAMUSCULAR | Status: DC | PRN

## 2024-11-15 MED ORDER — HYDROMORPHONE HCL 2 MG PO TABS: 2.0000 mg | ORAL_TABLET | Freq: Two times a day (BID) | ORAL | 0 refills | Status: AC | PRN

## 2024-11-15 MED ORDER — FENTANYL CITRATE (PF) 100 MCG/2ML IJ SOLN
INTRAMUSCULAR | Status: AC
  Filled 2024-11-15: qty 2

## 2024-11-15 MED ORDER — BUPIVACAINE HCL 0.25 % IJ SOLN
INTRAMUSCULAR | Status: DC | PRN
  Administered 2024-11-15: 20 mL

## 2024-11-15 MED ORDER — MIDAZOLAM HCL 2 MG/2ML IJ SOLN
INTRAMUSCULAR | Status: AC
  Filled 2024-11-15: qty 2

## 2024-11-15 MED ORDER — DEXMEDETOMIDINE HCL IN NACL 80 MCG/20ML IV SOLN
INTRAVENOUS | Status: DC | PRN
  Administered 2024-11-15: 12 ug via INTRAVENOUS

## 2024-11-15 MED ORDER — ACETAMINOPHEN 500 MG PO TABS: 1000.0000 mg | ORAL_TABLET | Freq: Once | ORAL | Status: AC

## 2024-11-15 MED ORDER — TRANEXAMIC ACID-NACL 1000-0.7 MG/100ML-% IV SOLN
1000.0000 mg | INTRAVENOUS | Status: AC
  Administered 2024-11-15 (×2): 1000 mg via INTRAVENOUS

## 2024-11-15 MED ORDER — ASPIRIN 325 MG PO TBEC: 325.0000 mg | DELAYED_RELEASE_TABLET | Freq: Every day | ORAL | 0 refills | Status: AC

## 2024-11-15 MED ORDER — ACETAMINOPHEN 500 MG PO TABS
ORAL_TABLET | ORAL | Status: AC
  Filled 2024-11-15: qty 2

## 2024-11-15 MED ORDER — KETOROLAC TROMETHAMINE 30 MG/ML IJ SOLN
INTRAMUSCULAR | Status: AC
  Filled 2024-11-15: qty 1

## 2024-11-15 MED ORDER — CEFAZOLIN SODIUM-DEXTROSE 2-4 GM/100ML-% IV SOLN
2.0000 g | INTRAVENOUS | Status: AC
  Administered 2024-11-15: 2 g via INTRAVENOUS

## 2024-11-15 MED ORDER — KETOROLAC TROMETHAMINE 30 MG/ML IJ SOLN
INTRAMUSCULAR | Status: DC | PRN
  Administered 2024-11-15: 30 mg via INTRAVENOUS

## 2024-11-15 MED ORDER — PROPOFOL 10 MG/ML IV BOLUS
INTRAVENOUS | Status: DC | PRN
  Administered 2024-11-15: 130 mg via INTRAVENOUS

## 2024-11-15 MED ORDER — KETOROLAC TROMETHAMINE 30 MG/ML IJ SOLN: 30.0000 mg | Freq: Once | INTRAMUSCULAR | Status: DC | PRN

## 2024-11-15 MED ORDER — ROCURONIUM BROMIDE 10 MG/ML (PF) SYRINGE
PREFILLED_SYRINGE | INTRAVENOUS | Status: AC
  Filled 2024-11-15: qty 10

## 2024-11-15 MED ORDER — ACETAMINOPHEN 500 MG PO TABS
1000.0000 mg | ORAL_TABLET | Freq: Once | ORAL | Status: AC
  Administered 2024-11-15: 1000 mg via ORAL

## 2024-11-15 MED ORDER — ONDANSETRON HCL 4 MG/2ML IJ SOLN
INTRAMUSCULAR | Status: DC | PRN
  Administered 2024-11-15: 4 mg via INTRAVENOUS

## 2024-11-15 MED ORDER — FENTANYL CITRATE (PF) 100 MCG/2ML IJ SOLN
25.0000 ug | INTRAMUSCULAR | Status: DC | PRN
  Administered 2024-11-15 (×2): 25 ug via INTRAVENOUS

## 2024-11-15 MED ORDER — HYDROMORPHONE HCL 1 MG/ML IJ SOLN
INTRAMUSCULAR | Status: AC
  Filled 2024-11-15: qty 0.5

## 2024-11-15 MED ORDER — LACTATED RINGERS IV SOLN: INTRAVENOUS | Status: DC | PRN

## 2024-11-15 MED ORDER — SODIUM CHLORIDE 0.9 % IR SOLN
Status: DC | PRN
  Administered 2024-11-15: 12000 mL

## 2024-11-15 MED ORDER — OXYCODONE HCL 5 MG/5ML PO SOLN: 5.0000 mg | Freq: Once | ORAL | Status: AC | PRN

## 2024-11-15 MED ORDER — DEXAMETHASONE SOD PHOSPHATE PF 10 MG/ML IJ SOLN
INTRAMUSCULAR | Status: DC | PRN
  Administered 2024-11-15: 10 mg via INTRAVENOUS

## 2024-11-15 SURGICAL SUPPLY — 63 items
ANCHOR SUT 1.4 FLEX (Anchor) IMPLANT
ANCHOR SUT 1.4 W1.2 XBRAID (Anchor) IMPLANT
BIT DRILL 1.4 ICONIX (BIT) IMPLANT
BIT DRILL FLEX NANOTACK (BIT) IMPLANT
BLADE SAMURAI STR FULL RADIUS (BLADE) IMPLANT
BLADE SURG 11 STRL SS (BLADE) ×1 IMPLANT
BUR ROUND HI FLUTE 8 4X19 (BURR) IMPLANT
CANISTER SUCT 1200ML W/VALVE (MISCELLANEOUS) ×1 IMPLANT
CANNULA 8 789 TRANSPORT (CANNULA) IMPLANT
CANNULA OBTURATOR FLOWPORT ST5 (CANNULA) IMPLANT
CHLORAPREP W/TINT 26 (MISCELLANEOUS) ×1 IMPLANT
COOLER ICEMAN CLASSIC (MISCELLANEOUS) ×1 IMPLANT
COVER BACK TABLE 60X90IN (DRAPES) ×1 IMPLANT
COVER MAYO STAND STRL (DRAPES) ×1 IMPLANT
DERMABOND ADVANCED .7 DNX12 (GAUZE/BANDAGES/DRESSINGS) IMPLANT
DISSECTOR 4.2MMX19CM HL (MISCELLANEOUS) ×1 IMPLANT
DRAPE C-ARM 42X72 X-RAY (DRAPES) ×1 IMPLANT
DRAPE STERI IOBAN 125X83 (DRAPES) IMPLANT
DRAPE U-SHAPE 47X51 STRL (DRAPES) ×2 IMPLANT
DRSG TEGADERM 4X4.75 (GAUZE/BANDAGES/DRESSINGS) ×3 IMPLANT
FEE RENTAL EQUIP HIP INSTR KIT (INSTRUMENTS) IMPLANT
GAUZE PAD ABD 8X10 STRL (GAUZE/BANDAGES/DRESSINGS) IMPLANT
GAUZE SPONGE 4X4 12PLY STRL (GAUZE/BANDAGES/DRESSINGS) ×1 IMPLANT
GAUZE XEROFORM 1X8 LF (GAUZE/BANDAGES/DRESSINGS) ×1 IMPLANT
GLOVE BIO SURGEON STRL SZ 6 (GLOVE) ×2 IMPLANT
GLOVE BIO SURGEON STRL SZ7.5 (GLOVE) ×2 IMPLANT
GLOVE BIOGEL PI IND STRL 6.5 (GLOVE) ×1 IMPLANT
GLOVE BIOGEL PI IND STRL 8 (GLOVE) ×1 IMPLANT
GOWN STRL REUS W/ TWL LRG LVL3 (GOWN DISPOSABLE) ×2 IMPLANT
GOWN STRL REUS W/TWL XL LVL3 (GOWN DISPOSABLE) ×1 IMPLANT
GRAFT TISS ANT TIB TNDN (Tissue) IMPLANT
INSTRUMENT ORTHO TEXT HIP FEM (INSTRUMENTS) IMPLANT
KIT PATIENT POSITION MEDIUM (KITS) IMPLANT
KIT PORTAL ENTRY HIP ACCESS (KITS) IMPLANT
MANIFOLD NEPTUNE II (INSTRUMENTS) ×1 IMPLANT
NDL HYPO 22X1.5 SAFETY MO (MISCELLANEOUS) IMPLANT
NDL INJECTOR II CARTRIDGE (MISCELLANEOUS) IMPLANT
NDL MAYO TROCAR (NEEDLE) IMPLANT
NDL SPNL 18GX3.5 QUINCKE PK (NEEDLE) IMPLANT
NDL SUT 6 .5 CRC .975X.05 MAYO (NEEDLE) IMPLANT
NEEDLE HYPO 22X1.5 SAFETY MO (MISCELLANEOUS) IMPLANT
NEEDLE INJECTOR II CARTRIDGE (MISCELLANEOUS) IMPLANT
NEEDLE MAYO TROCAR (NEEDLE) IMPLANT
NEEDLE SPNL 18GX3.5 QUINCKE PK (NEEDLE) IMPLANT
PACK BASIN DAY SURGERY FS (CUSTOM PROCEDURE TRAY) ×1 IMPLANT
PAD COLD SHLDR WRAP-ON (PAD) ×1 IMPLANT
PASSER SUT 1.5D CRESCENT (INSTRUMENTS) IMPLANT
SOL .9 NS 3000ML IRR UROMATIC (IV SOLUTION) IMPLANT
SPIKE FLUID TRANSFER (MISCELLANEOUS) IMPLANT
SPONGE T-LAP 18X18 ~~LOC~~+RFID (SPONGE) IMPLANT
SUT ETHILON 3 0 PS 1 (SUTURE) IMPLANT
SUT MNCRL AB 3-0 PS2 27 (SUTURE) IMPLANT
SUT MNCRL AB 4-0 PS2 18 (SUTURE) ×1 IMPLANT
SUT VIC AB 0 CT1 27XBRD ANBCTR (SUTURE) IMPLANT
SUT VIC AB 2-0 CT1 TAPERPNT 27 (SUTURE) IMPLANT
SUT XBRAID 1.4 BLUE/BLACK (SUTURE) IMPLANT
SUTURE TAPE 1.3 FIBERLOP 20 ST (SUTURE) IMPLANT
SYR 20ML LL LF (SYRINGE) IMPLANT
SYR 50ML LL SCALE MARK (SYRINGE) IMPLANT
TOWEL GREEN STERILE FF (TOWEL DISPOSABLE) ×2 IMPLANT
TUBE CONNECTING 20X1/4 (TUBING) ×3 IMPLANT
TUBING ARTHROSCOPY IRRIG 16FT (MISCELLANEOUS) ×1 IMPLANT
WAND APOLLO RF 50D ABLATOR (BUR) ×1 IMPLANT

## 2024-11-15 NOTE — Transfer of Care (Signed)
 Immediate Anesthesia Transfer of Care Note  Patient: Ann Taylor  Procedure(s) Performed: ARTHROSCOPY, HIP, WITH LABRUM REPAIR (Right)  Patient Location: PACU  Anesthesia Type:General  Level of Consciousness: drowsy and patient cooperative  Airway & Oxygen Therapy: Patient Spontanous Breathing and Patient connected to face mask oxygen  Post-op Assessment: Report given to RN and Post -op Vital signs reviewed and stable  Post vital signs: Reviewed and stable  Last Vitals:  Vitals Value Taken Time  BP 118/69 11/15/24 13:48  Temp    Pulse 69 11/15/24 13:52  Resp 13 11/15/24 13:52  SpO2 100 % 11/15/24 13:52  Vitals shown include unfiled device data.  Last Pain:  Vitals:   11/15/24 0903  TempSrc: Temporal  PainSc: 0-No pain         Complications: No notable events documented.

## 2024-11-15 NOTE — Anesthesia Postprocedure Evaluation (Signed)
"   Anesthesia Post Note  Patient: Ann Taylor  Procedure(s) Performed: ARTHROSCOPY, HIP, WITH LABRUM REPAIR (Right)     Patient location during evaluation: PACU Anesthesia Type: General Level of consciousness: awake and alert Pain management: pain level controlled Vital Signs Assessment: post-procedure vital signs reviewed and stable Respiratory status: spontaneous breathing, nonlabored ventilation, respiratory function stable and patient connected to nasal cannula oxygen Cardiovascular status: blood pressure returned to baseline and stable Postop Assessment: no apparent nausea or vomiting Anesthetic complications: no   No notable events documented.  Last Vitals:  Vitals:   11/15/24 1445 11/15/24 1554  BP: 109/77 (!) 106/56  Pulse: 84 79  Resp: 14 16  Temp:  (!) 36.4 C  SpO2: 95% 98%    Last Pain:  Vitals:   11/15/24 1543  TempSrc:   PainSc: 8                  [name]      "

## 2024-11-15 NOTE — Brief Op Note (Signed)
" ° °  Brief Op Note  Date of Surgery: 11/15/2024  Preoperative Diagnosis: RIGHT HIP LABRAL INSUFFICIENCY  Postoperative Diagnosis: same  Procedure: Procedures: ARTHROSCOPY, HIP, WITH LABRUM REPAIR  Implants: Implant Name Type Inv. Item Serial No. Manufacturer Lot No. LRB No. Used Action  GRAFT TISS ANT TIB TNDN - [id number] Tissue GRAFT TISS ANT TIB TNDN [id number] [employer]  Right 1 Implanted  ANCHOR SUT 1.4 FLEX - [id number] Anchor ANCHOR SUT 1.4 FLEX  [employer] [id number] Right 1 Implanted  ANCHOR SUT 1.4 W1.2 XBRAID - [id number] Anchor ANCHOR SUT 1.4 W1.2 XBRAID  [employer] [id number] Right 1 Implanted  ANCHOR SUT 1.4 W1.2 XBRAID - [id number] Anchor ANCHOR SUT 1.4 W1.2 [employer] [id number] Right 1 Implanted  ANCHOR SUT 1.4 FLEX - [id number] Anchor ANCHOR SUT 1.4 FLEX  [employer] [id number] Right 1 Implanted    Surgeons: Surgeon(s): Genelle Standing, MD  Anesthesia: General    Estimated Blood Loss: See anesthesia record  Complications: None  Condition to PACU: Stable  Standing LITTIE Genelle, MD 11/15/2024 1:27 PM  "

## 2024-11-15 NOTE — Discharge Instructions (Addendum)
 "    Discharge Instructions    Attending Surgeon: Elspeth Parker, MD Office Phone Number: [phone]   Diagnosis and Procedures:    Surgeries Performed: Right hip labral reconstruction  Discharge Plan:    Diet: Resume usual diet. Begin with light or bland foods.  Drink plenty of fluids.  Activity:  Weight bearing as tolerated right leg. You are advised to go home directly from the hospital or surgical center. Restrict your activities.  GENERAL INSTRUCTIONS: 1.  Please apply ice to your wound to help with swelling and inflammation. This will improve your comfort and your overall recovery following surgery.     2. Please call Dr. Danetta office at [phone] with questions Monday-Friday during business hours. If no one answers, please leave a message and someone should get back to the patient within 24 hours. For emergencies please call 911 or proceed to the emergency room.   3. Patient to notify surgical team if experiences any of the following: Bowel/Bladder dysfunction, uncontrolled pain, nerve/muscle weakness, incision with increased drainage or redness, nausea/vomiting and Fever greater than 101.0 F.  Be alert for signs of infection including redness, streaking, odor, fever or chills. Be alert for excessive pain or bleeding and notify your surgeon immediately.  WOUND INSTRUCTIONS:   Leave your dressing, cast, or splint in place until your post operative visit.  Keep it clean and dry.  Always keep the incision clean and dry until the staples/sutures are removed. If there is no drainage from the incision you should keep it open to air. If there is drainage from the incision you must keep it covered at all times until the drainage stops  Do not soak in a bath tub, hot tub, pool, lake or other body of water until 21 days after your surgery and your incision is completely dry and healed.  If you have removable sutures (or staples) they must be removed 10-14 days (unless  otherwise instructed) from the day of your surgery.     1)  Elevate the extremity as much as possible.  2)  Keep the dressing clean and dry.  3)  Please call us  if the dressing becomes wet or dirty.  4)  If you are experiencing worsening pain or worsening swelling, please call.     MEDICATIONS: Resume all previous home medications at the previous prescribed dose and frequency unless otherwise noted Start taking the  pain medications on an as-needed basis as prescribed  Please taper down pain medication over the next week following surgery.  Ideally you should not require a refill of any narcotic pain medication.  Take pain medication with food to minimize nausea. In addition to the prescribed pain medication, you may take over-the-counter pain relievers such as Tylenol .  Do NOT take additional tylenol  if your pain medication already has tylenol  in it.  Aspirin  325mg  daily per instructions on bottle. Narcotic policy: Per [facility] clinic policy, our goal is ensure optimal postoperative pain control with a multimodal pain management strategy. For all [facility] patients, our goal is to wean post-operative narcotic medications by 6 weeks post-operatively, and many times sooner. If this is not possible due to utilization of pain medication prior to surgery, your [facility] doctor will support your acute post-operative pain control for the first 6 weeks postoperatively, with a plan to transition you back to your primary pain team following that. Maralee will work to ensure a therapist, occupational.       FOLLOWUP INSTRUCTIONS: 1. Follow up at the Physical  Therapy Clinic 3-4 days following surgery. This appointment should be scheduled unless other arrangements have been made.The Physical Therapy scheduling number is [phone] if an appointment has not already been arranged.  2. Contact Dr. Danetta office during office hours at [phone] or the practice after hours line at [phone] for  non-emergencies. For medical emergencies call 911.   Discharge Location: Home  No Tylenol  until 3:06pm,  "

## 2024-11-15 NOTE — H&P (Signed)
 "                         Post Operative Evaluation      Procedure/Date of Surgery: Status post right hip pincer debridement 1/16   Interval History:    Presents status post the above procedure.  Overall she is still in persistent pain in the C-shaped distribution.  She has stalled in terms of progress with physical therapy.  She has not been able to work without pain.  She is here today for further discussion     PMH/PSH/Family History/Social History/Meds/Allergies:         Past Medical History:  Diagnosis Date   Arrhythmia     Headache(784.0)     Heart murmur     UTI (lower urinary tract infection)               Past Surgical History:  Procedure Laterality Date   BREAST BIOPSY Left 04/17/2023    MM LT BREAST BX W LOC DEV 1ST LESION IMAGE BX SPEC STEREO GUIDE 04/17/2023 [facility]   CESAREAN SECTION       HIP FRACTURE SURGERY Right 12/11/2023        Social History         Socioeconomic History   Marital status: Single      Spouse name: Not on file   Number of children: Not on file   Years of education: Not on file   Highest education level: Not on file  Occupational History   Not on file  Tobacco Use   Smoking status: Never   Smokeless tobacco: Never  Vaping Use   Vaping status: Never Used  Substance and Sexual Activity   Alcohol use: No   Drug use: No   Sexual activity: Yes      Birth control/protection: None  Other Topics Concern   Not on file  Social History Narrative   Not on file    Social Drivers of Health        Financial Resource Strain: At Risk (04/25/2023)    Received from [employer]     Financial Resource Strain: 2  Food Insecurity: Not on file (04/25/2023)  Transportation Needs: Not at Risk (04/25/2023)    Received from [employer] [facility]     Transportation: 1  Physical Activity: Not on File (03/10/2023)    Received from [facility]    Physical Activity     Physical Activity: 0  Stress: Not on  File (03/10/2023)    Received from [facility]    Stress     Stress: 0  Social Connections: Not on File (07/29/2023)    Received from [employer]    Social Connections     Connectedness: 0         Family History  Problem Relation Age of Onset   Alcohol abuse Neg Hx     Arthritis Neg Hx     Asthma Neg Hx     Birth defects Neg Hx     Cancer Neg Hx     COPD Neg Hx     Depression Neg Hx     Diabetes Neg Hx     Drug abuse Neg Hx     Early death Neg Hx     Hearing loss Neg Hx     Heart disease Neg Hx     Hyperlipidemia Neg Hx     Hypertension Neg Hx  Kidney disease Neg Hx     Learning disabilities Neg Hx     Mental illness Neg Hx     Mental retardation Neg Hx     Miscarriages / Stillbirths Neg Hx     Stroke Neg Hx     Vision loss Neg Hx          Allergies      Allergies  Allergen Reactions   Oxycodone  Hives and Itching   Shellfish Allergy Anaphylaxis   Shellfish-Derived Products Anaphylaxis   Grass Pollen(K-O-R-T-Swt Vern) Hives   Codone [Hydrocodone ] Itching   Codeine Hives, Itching and Rash   Tramadol  Hives, Itching and Anxiety            Current Outpatient Medications  Medication Sig Dispense Refill   gabapentin  (NEURONTIN ) 300 MG capsule Take 300 mg by mouth daily. (Patient not taking: Reported on 07/23/2024)       metroNIDAZOLE  (FLAGYL ) 500 MG tablet Take 1 tablet (500 mg total) by mouth 2 (two) times daily. (Patient not taking: Reported on 07/23/2024) 14 tablet 0   naproxen  (NAPROSYN ) 500 MG tablet Take 1 tablet (500 mg total) by mouth 2 (two) times daily. Take with food 60 tablet 0   triamterene -hydrochlorothiazide (DYAZIDE) 37.5-25 MG capsule Take 1 each (1 capsule total) by mouth daily. 15 capsule 0      No current facility-administered medications for this visit.      Imaging Results (Last 48 hours)  No results found.     Review of Systems:   A ROS was performed including pertinent positives and negatives as documented in the HPI.     Musculoskeletal Exam:      There were no vitals taken for this visit.   Right hip incisions are well-appearing without erythema or drainage.  Distal neurosensory exam is intact.  30 degrees internal/external rotation of the right hip without significant pain.  Walks with improved gait today.  Full right knee extension flexion strength with plantar and dorsiflexion strength with sensation intact to light touch in the L4-S1 distribution   Imaging:         I personally reviewed and interpreted the radiographs.     Assessment:   Status post right hip arthroscopy with pincer debridement now with persistent right hip and groin pain.  Unfortunately she despite having aggressive physical therapy has plateaued.  She has not gotten lasting relief from her injection.  Given this I did discuss that given the pincer debridement there was not significant labral tissue remaining and as a result we did discuss the possibility of labral reconstruction.  I did discuss risk limitations as well as associated recovery timeframe.  After discussion she would ultimately like to proceed Plan :     - Plan for right hip arthroscopy with labral reconstruction     After a lengthy discussion of treatment options, including risks, benefits, alternatives, complications of surgical and nonsurgical conservative options, the patient elected surgical repair.    The patient  is aware of the material risks  and complications including, but not limited to injury to adjacent structures, neurovascular injury, infection, numbness, bleeding, implant failure, thermal burns, stiffness, persistent pain, failure to heal, disease transmission from allograft, need for further surgery, dislocation, anesthetic risks, blood clots, risks of death,and others. The probabilities of surgical success and failure discussed with patient given their particular co-morbidities.The time and nature of expected rehabilitation and recovery was discussed.The patient's questions were  all answered preoperatively.  No barriers to understanding were noted. I  explained the natural history of the disease process and Rx rationale.  I explained to the patient what I considered to be reasonable expectations given their personal situation.  The final treatment plan was arrived at through a shared patient decision making process model.         I personally saw and evaluated the patient, and participated in the management and treatment plan.   Elspeth Parker, MD Attending Physician, Orthopedic Surgery   This document was dictated using Dragon voice recognition software. A reasonable attempt at proof reading has been made to minimize errors. "

## 2024-11-15 NOTE — Progress Notes (Signed)
 Times for next dose available for narcotic pain medication as well as tylenol  updated on dc paperwork.

## 2024-11-15 NOTE — Anesthesia Preprocedure Evaluation (Addendum)
"                                    Anesthesia Evaluation  Patient identified by MRN, date of birth, ID band Patient awake    Reviewed: Allergy & Precautions, NPO status , Patient's Chart, lab work & pertinent test results  History of Anesthesia Complications Negative for: history of anesthetic complications  Airway Mallampati: II  TM Distance: >3 FB Neck ROM: Full    Dental no notable dental hx. (+) Teeth Intact, Dental Advisory Given   Pulmonary    Pulmonary exam normal breath sounds clear to auscultation       Cardiovascular hypertension, Pt. on medications (-) angina (-) Past MI Normal cardiovascular exam Rhythm:Regular Rate:Normal     Neuro/Psych  Headaches PSYCHIATRIC DISORDERS         GI/Hepatic Neg liver ROS,neg GERD  ,,  Endo/Other  negative endocrine ROS    Renal/GU negative Renal ROS     Musculoskeletal negative musculoskeletal ROS (+)    Abdominal   Peds  Hematology negative hematology ROS (+)       Anesthesia Other Findings   Reproductive/Obstetrics negative OB ROS                              Anesthesia Physical Anesthesia Plan  ASA: 2  Anesthesia Plan: General   Post-op Pain Management: Precedex  and Tylenol  PO (pre-op)*   Induction: Intravenous  PONV Risk Score and Plan: 4 or greater and Treatment may vary due to age or medical condition, Ondansetron , Midazolam  and Dexamethasone   Airway Management Planned: Oral ETT  Additional Equipment: None  Intra-op Plan:   Post-operative Plan: Extubation in OR  Informed Consent: I have reviewed the patients History and Physical, chart, labs and discussed the procedure including the risks, benefits and alternatives for the proposed anesthesia with the patient or authorized representative who has indicated his/her understanding and acceptance.     Dental advisory given  Plan Discussed with: CRNA and Surgeon  Anesthesia Plan Comments:           Anesthesia Quick Evaluation  "

## 2024-11-15 NOTE — Op Note (Signed)
 "  Date of Surgery: 11/15/2024  INDICATIONS: Ms. Ditmars is a 43 y.o.-year-old female with right hip labral tear.  The risk and benefits of the procedure were discussed in detail and documented in the pre-operative evaluation.   PREOPERATIVE DIAGNOSIS: 1. Right hip pincer lesion with labral tear  POSTOPERATIVE DIAGNOSIS: Same.  PROCEDURE: 1. Right hip pincer debridement 2. Right hip labral reconstruction  SURGEON: Elspeth LITTIE Parker MD  ASSISTANT: Conley Dawson, ATC  ANESTHESIA:  general  IV FLUIDS AND URINE: See anesthesia record.  ANTIBIOTICS: Ancef   ESTIMATED BLOOD LOSS: 10 mL.  IMPLANTS:  Implant Name Type Inv. Item Serial No. Manufacturer Lot No. LRB No. Used Action  GRAFT TISS ANT TIB TNDN - [id number] Tissue GRAFT TISS ANT TIB TNDN [id number] [employer]  Right 1 Implanted  ANCHOR SUT 1.4 FLEX - [id number] Anchor ANCHOR SUT 1.4 FLEX  [employer] [id number] Right 1 Implanted  ANCHOR SUT 1.4 W1.2 XBRAID - [id number] Anchor ANCHOR SUT 1.4 W1.2 [employer] [id number] Right 1 Implanted  ANCHOR SUT 1.4 W1.2 XBRAID - [id number] Anchor ANCHOR SUT 1.4 W1.2 [employer] [id number] Right 1 Implanted  ANCHOR SUT 1.4 FLEX - [id number] Anchor ANCHOR SUT 1.4 FLEX  [employer] [id number] Right 1 Implanted    DRAINS: None  CULTURES: None  COMPLICATIONS: none  DESCRIPTION OF PROCEDURE:  Cartilage Intact femoral and acetabular cartilage   Labrum Frayed/hypoplastic appearing with residual pincer lesion     OPERATIVE REPORT:  The patient was brought to the operating room, placed supine on the operating table, and bony prominences were padded.  The traction boots were applied with padding to ensure that safe traction could be applied through the feet.  The contralateral limb was abducted maximally and light traction was applied.  The operative leg was brought into neutral position.  The flouroscopic c-arm was brought between the legs for an AP  image.  The patient was prepped and draped in a sterile fashion.  Time-out was performed and landmarks were identified. Traction was obtained and care was taken to ensure the least amount of force necessary to allow safe access to the joint of 8-4mm.  This was checked with fluoroscopy.    Next we placed an anterolateral portal under the assistance of fluoroscopy.  First, fluoroscopy was used to estimate the trajectory and starting point.  A 5mm incision with a #11 blade was made and a straight hemostat was used to dilate the portal through the appropriate tract.  We then placed a 14-gauge hypodermic needle with careful technique to be as close to the femoral head as possible and parallel to the sorcele to ensure no iatrogenic damage to the labrum.  This released the negative pressure environment and the amount of traction was adjusted to maintain the 8-83mm of distraction.  A nitinol wire was placed through the needle and flouroscopy was used to ensure it extended to the medial wall of the acetabulum.  The Flowport from [employer] was placed over the wire and the nitinol wire was retracted to just inside the capsule during insertion of the dilator and cannula to minimize the risk of breakage. The arthroscope was placed next and we visualized the anterior triangle.     We then placed the anterior portal under direct visualization using the technique described above.  This was safely placed as well without damage to the labrum or femoral head.  We then switched our arthroscope to the anterior portal to ensure we were not through  the labrum - we were safely through the capsule only.  We then proceeded with periportal capsulotomies utilizing the Samurai blade in each portal without connecting the two.  We identified the anterior inferior iliac spine proximally, the psoas tendon medially and the rectus tendon laterally as landmarks.  We then proceeded with a diagnostic arthroscopy - the results can be  found in the findings section above.    We then used the radiofrequency device to clear the superior acetabulum and expose the subspinous region.  Next we exposed the acetabular rim leaving the chondral labral junction intact. The acetabular rim/subspinous region was reshaped with 5.5 mm bur.  There was residual pincer lesion which was debrided using a shaver  An accessory distal anterior lateral portal was created again under direct visualization using spinal needle and subsequently dilated.  The correct size flow port cannula was then measured using a switching stick and this was inserted for passing and suture management.    We then used the 50 degree hip specific radiofrequency device from [employer]. and a 4mm shaver to clear the superior acetabulum and expose the subspinous region.  Given the hypoplastic nature of the labrum the decision was made to perform a labral reconstruction.  A tibialis anterior allograft was tubularized on the back table and prepared for passage.   When adequate reshaping of the acetabular rim was obtained we then proceeded with the labral repair. We placed 3 anchors at the 12:00, 1:00, and 2:30 positions. The sutures were passed using the crescent Nanopass from [employer].  At this time 1 limb of the sutures were passed through the residual labral tissue and the other limb was passed out the anterior portal.  This was done a total of 3 times.  The distal anterior lateral portal was used to parachuted the graft into the joint from the anterior portal by pulling on the 12:00 suture.  This was tied in place.  A switching stick was used to lay the labrum behind the native labral tissue.  At this time the residual additional sutures were tied from posterior to anterior with excellent apposition into the native graft tissue.  Traction was let down with total traction time of 60 minutes.     Finally, we performed a complete capsular closure with tape suture.  She was replaced in  the anterior and posterior limb of the reported capsulotomy with excellent apposition. We then removed the arthroscope and closed the incisions with 3-0 nylon simple stitches.  A sterile dressing was applied..  The patient was awakened from anesthesia and transferred to PACU in stable condition.       POSTOPERATIVE PLAN:    Weightbearing as tolerated operative hip Formal physical therapy will begin this week. ASA 325 Daily for DVT prophylaxis      Elspeth LITTIE Parker, MD 1:28 PM    "

## 2024-11-15 NOTE — Interval H&P Note (Signed)
 History and Physical Interval Note:  11/15/2024 9:18 AM  Ann Taylor  has presented today for surgery, with the diagnosis of RIGHT HIP LABRAL INSUFFICIENCY.  The various methods of treatment have been discussed with the patient and family. After consideration of risks, benefits and other options for treatment, the patient has consented to  Procedures with comments: ARTHROSCOPY, HIP, WITH LABRUM REPAIR (Right) - RIGHT HIP ARTHROSCOPY WITH LABRAL RECONSTRUCTION as a surgical intervention.  The patient's history has been reviewed, patient examined, no change in status, stable for surgery.  I have reviewed the patient's chart and labs.  Questions were answered to the patient's satisfaction.     Ann Taylor

## 2024-11-15 NOTE — Anesthesia Procedure Notes (Signed)
 Procedure Name: Intubation Date/Time: 11/15/2024 11:41 AM  Performed by: Denton Niels CROME, CRNAPre-anesthesia Checklist: Patient identified, Emergency Drugs available, Suction available and Patient being monitored Patient Re-evaluated:Patient Re-evaluated prior to induction Oxygen Delivery Method: Circle system utilized Preoxygenation: Pre-oxygenation with 100% oxygen Induction Type: IV induction Ventilation: Mask ventilation without difficulty Laryngoscope Size: Mac and 3 Grade View: Grade I Tube type: Oral Tube size: 7.0 mm Number of attempts: 1 Placement Confirmation: ETT inserted through vocal cords under direct vision, positive ETCO2 and breath sounds checked- equal and bilateral Secured at: 21.5 cm Tube secured with: Tape Dental Injury: Teeth and Oropharynx as per pre-operative assessment

## 2024-11-16 ENCOUNTER — Telehealth: Payer: Self-pay | Admitting: Orthopaedic Surgery

## 2024-11-16 ENCOUNTER — Other Ambulatory Visit: Payer: Self-pay | Admitting: Orthopaedic Surgery

## 2024-11-16 MED ORDER — HYDROMORPHONE HCL 2 MG PO TABS: 2.0000 mg | ORAL_TABLET | ORAL | 0 refills | Status: AC | PRN

## 2024-11-16 NOTE — Telephone Encounter (Signed)
 Pt called saying that [facility] on [address] is waiting for authorization of prescriptions. Call back number is [phone].

## 2024-11-16 NOTE — Telephone Encounter (Signed)
 Left message for pharmacist to return call for auth type

## 2024-11-16 NOTE — Telephone Encounter (Signed)
 I talked to the pharmacist. Patient picked up the rx 20 mins ago

## 2024-11-17 NOTE — Therapy (Unsigned)
 " OUTPATIENT PHYSICAL THERAPY LOWER EXTREMITY EVALUATION   Patient Name: Ann Taylor MRN: [id number] DOB:[birth year], 43 y.o., female Today's Date: 11/19/2024  END OF SESSION:  PT End of Session - 11/19/24 1447     Visit Number 1    Number of Visits 25    Date for Recertification  02/09/25    Authorization Type [employer]    Authorization Time Period TBD    PT Start Time 1433    PT Stop Time 1513    PT Time Calculation (min) 40 min    Activity Tolerance Patient tolerated treatment well    Behavior During Therapy WFL for tasks assessed/performed          Past Medical History:  Diagnosis Date   Arrhythmia    Headache(784.0)    Heart murmur    UTI (lower urinary tract infection)    Past Surgical History:  Procedure Laterality Date   BREAST BIOPSY Left 04/17/2023   MM LT BREAST BX W LOC DEV 1ST LESION IMAGE BX SPEC STEREO GUIDE 04/17/2023 [facility]   CESAREAN SECTION     HIP FRACTURE SURGERY Right 12/11/2023   Patient Active Problem List   Diagnosis Date Noted   Hip impingement syndrome, right 11/15/2024   Visit for routine gyn exam 03/27/2023   STD exposure 03/27/2023   Nexplanon  in place 03/27/2023   Bilateral radiating leg pain 02/12/2023   Mood disorder 02/12/2023   Essential hypertension 07/28/2021   Family history of sickle cell anemia 07/28/2021   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   Neuropathy 07/28/2021   Other allergy, initial encounter 07/28/2021   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 07/26/2021   Migraine 07/26/2021    PCP: Duwaine Riggs FNP   REFERRING PROVIDER: Genelle Standing, MD  REFERRING DIAG:  Diagnosis  R10.31 (ICD-10-CM) - Right groin pain    THERAPY DIAG:  Pain in right hip  Difficulty in walking, not elsewhere classified  Muscle weakness (generalized)  Stiffness of right hip, not elsewhere classified  Rationale for Evaluation and Treatment: Rehabilitation  ONSET DATE: R hip pincer debridement and labral reconstruction  11/15/24  SUBJECTIVE:   SUBJECTIVE STATEMENT:  Took my pain medicine about an hour before therapy but it still hurts. Tend to drag it when its hurting. Not using the crutches when its not hurting. Its still early, but I still feel really exhausted after moving around. Might have done a little too much cleaning the kitchen the other day, felt pretty tired after and had a lot of pain too. Still hurts to lift leg, I just make sure to take my time. They told me to hold off on showering but I ended up doing one the other day anyway.   PERTINENT HISTORY: See above  PAIN:  Are you having pain? Yes: NPRS scale: 8/10 Pain location: R hip  Pain description: throbbing, sharp, tingling down whole leg, pain in low back  Aggravating factors: moving it the wrong way  Relieving factors: taking right med combo for pain  PRECAUTIONS: Other: WBAT per op note   RED FLAGS: None   WEIGHT BEARING RESTRICTIONS: No  FALLS:  Has patient fallen in last 6 months? No  LIVING ENVIRONMENT: Lives with: lives with their family Lives in: House/apartment Stairs: no stairs  Has following equipment at home: Crutches  OCCUPATION: front desk [employer]   PLOF: Independent, Independent with basic ADLs, Independent with gait, and Independent with transfers  PATIENT GOALS: get back to work, heal correctly, be able to drive   NEXT MD  VISIT: 11/26/24 with surgeon  OBJECTIVE:  Note: Objective measures were completed at Evaluation unless otherwise noted.    PATIENT SURVEYS:  LEFS  Extreme difficulty/unable (0), Quite a bit of difficulty (1), Moderate difficulty (2), Little difficulty (3), No difficulty (4)  Survey date:   11/19/24  Any of your usual work, housework or school activities 1  2. Usual hobbies, recreational or sporting activities 1  3. Getting into/out of the bath 1  4. Walking between rooms 1  5. Putting on socks/shoes 0  6. Squatting  0  7. Lifting an object, like a bag of groceries from the floor 0   8. Performing light activities around your home 1  9. Performing heavy activities around your home 0  10. Getting into/out of a car 1  11. Walking 2 blocks 0  12. Walking 1 mile 0  13. Going up/down 10 stairs (1 flight) 0  14. Standing for 1 hour 0  15.  sitting for 1 hour 4  16. Running on even ground 0  17. Running on uneven ground 0  18. Making sharp turns while running fast 0  19. Hopping  0  20. Rolling over in bed 1  Score total:  11/80     COGNITION: Overall cognitive status: Within functional limits for tasks assessed     SENSATION:  Reports tingling in entire R LE     LOWER EXTREMITY ROM:  Passive ROM Right eval Left eval  Hip flexion 90   Hip extension    Hip abduction WNL    Hip adduction    Hip internal rotation 15   Hip external rotation Deferred    Knee flexion    Knee extension    Ankle dorsiflexion    Ankle plantarflexion    Ankle inversion    Ankle eversion     (Blank rows = not tested)  LOWER EXTREMITY MMT:  MMT Right eval Left eval  Hip flexion    Hip extension    Hip abduction    Hip adduction    Hip internal rotation    Hip external rotation    Knee flexion 4+ 4+  Knee extension 5 5  Ankle dorsiflexion    Ankle plantarflexion    Ankle inversion    Ankle eversion     (Blank rows = not tested)      GAIT: Distance walked: in clinic distances  Assistive device utilized: Crutches Level of assistance: Modified independence Comments: antalgic, dragging R LE due to pain, limited WB surgical LE                                                                                                                                 TREATMENT DATE:   11/19/24  Eval, POC, HEP  Changed bandage- no signs of infection, inflammation,  excessive drainage from incision site. Dressed incision with 3 small tegaderm dressings, placed large tegaderm over all three for extra  protection  HEP review/discussion  PATIENT EDUCATION:  Education  details: exam findings, POC, HEP, general expectations and timelines of recovery with PT  Person educated: Patient Education method: Explanation, Demonstration, and Handouts Education comprehension: verbalized understanding, returned demonstration, and needs further education  HOME EXERCISE PROGRAM: Access Code: [email or web address] URL: [email or web address] Date: 11/17/2024 Prepared by: Josette Rough  Exercises - Seated Ankle Pumps  - 2-3 x daily - 7 x weekly - 1 sets - 20 reps - Supine Quad Set  - 2-3 x daily - 7 x weekly - 1 sets - 10 reps - Hooklying Gluteal Sets  - 2-3 x daily - 7 x weekly - 1 sets - 10 reps - Hooklying Heel Slide  - 2-3 x daily - 7 x weekly - 1 sets - 10 reps - Supine Transversus Abdominis Bracing - Hands on Stomach  - 2-3 x daily - 7 x weekly - 1 sets - 10 reps - Supine Hip Adduction Isometric with Ball  - 2-3 x daily - 7 x weekly - 1 sets - 10 reps  ASSESSMENT:  CLINICAL IMPRESSION:  Patient is a 43 y.o. F who was seen today for physical therapy evaluation and treatment for skilled PT care following her surgery on 11/15/24. Objectives as above. Will make every effort to address pain and assist in return to optimal desired level of function moving forward.    OBJECTIVE IMPAIRMENTS: Abnormal gait, decreased activity tolerance, decreased balance, decreased knowledge of use of DME, decreased mobility, difficulty walking, decreased ROM, decreased strength, hypomobility, increased fascial restrictions, impaired perceived functional ability, impaired flexibility, and pain.   ACTIVITY LIMITATIONS: carrying, lifting, bending, standing, squatting, sleeping, stairs, transfers, bed mobility, and locomotion level  PARTICIPATION LIMITATIONS: meal prep, cleaning, laundry, driving, shopping, community activity, and occupation  PERSONAL FACTORS: Past/current experiences and Time since onset of injury/illness/exacerbation are also affecting patient's functional outcome.    REHAB POTENTIAL: Good  CLINICAL DECISION MAKING: Stable/uncomplicated  EVALUATION COMPLEXITY: Low   GOALS: Goals reviewed with patient? No  SHORT TERM GOALS: Target date: 12/29/2024   1. Will be independent with appropriate progressive HEP  Baseline: Goal status: INITIAL  2.  Gait mechanics to have normalized with LRAD and pain with ambulation to be no more than 3/10 Baseline:  Goal status: INITIAL  3.  Hip AROM and PROM to be WNL with no extra pain  Baseline:  Goal status: INITIAL  4.  Will be independent in edema management program  Baseline:  Goal status: INITIAL  LONG TERM GOALS: Target date: 02/09/2025   1. MMT to be 5/5 in all tested groups Baseline:  Goal status: INITIAL  2.   Will have been able to return to all desired work and daily functional activities without increased pain  Baseline:  Goal status: INITIAL  3.  Will be able to climb flight of steps with reciprocal pattern and no increase in pain  Baseline:  Goal status: INITIAL  4.  Will be compliant with appropriate progressive return to gym level exercise programming without increased pain   Baseline:  Goal status: INITIAL    PLAN:  PT FREQUENCY: 2x/week  PT DURATION: 12 weeks  PLANNED INTERVENTIONS: 97164- PT Re-evaluation, 97750- Physical Performance Testing, 97110-Therapeutic exercises, 97530- Therapeutic activity, [id number]- Neuromuscular re-education, 97535- Self Care, 02859- Manual therapy, [phone]- Gait training, and [phone]- Aquatic Therapy  PLAN FOR NEXT SESSION: per protocol, check dressings, update HEP   Josette Rough, PT, DPT 11/19/2024 3:28 PM  "

## 2024-11-17 NOTE — Progress Notes (Unsigned)
 " OUTPATIENT PHYSICAL THERAPY LOWER EXTREMITY EVALUATION   Patient Name: Ann Taylor MRN: [id number] DOB:Mar 05, 1981, 43 y.o., female Today's Date: 11/17/2024  END OF SESSION:   Past Medical History:  Diagnosis Date   Arrhythmia    Headache(784.0)    Heart murmur    UTI (lower urinary tract infection)    Past Surgical History:  Procedure Laterality Date   BREAST BIOPSY Left 04/17/2023   MM LT BREAST BX W LOC DEV 1ST LESION IMAGE BX SPEC STEREO GUIDE 04/17/2023 [facility]   CESAREAN SECTION     HIP FRACTURE SURGERY Right 12/11/2023   Patient Active Problem List   Diagnosis Date Noted   Hip impingement syndrome, right 11/15/2024   Visit for routine gyn exam 03/27/2023   STD exposure 03/27/2023   Nexplanon  in place 03/27/2023   Bilateral radiating leg pain 02/12/2023   Mood disorder 02/12/2023   Essential hypertension 07/28/2021   Family history of sickle cell anemia 07/28/2021   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   Neuropathy 07/28/2021   Other allergy, initial encounter 07/28/2021   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  Rationale for Evaluation and Treatment: {HABREHAB:27488}  ONSET DATE: ***  SUBJECTIVE:   SUBJECTIVE STATEMENT: ***  PERTINENT HISTORY: *** PAIN:  Are you having pain? {OPRCPAIN:27236}  PRECAUTIONS: {Therapy precautions:24002}  RED FLAGS: {PT Red Flags:29287}   WEIGHT BEARING RESTRICTIONS: {Yes ***/No:24003}  FALLS:  Has patient fallen in last 6 months? {fallsyesno:27318}  LIVING ENVIRONMENT: Lives with: {OPRC lives with:25569::lives with their family} Lives in: {Lives in:25570} Stairs: {opstairs:27293} Has following equipment at home: {Assistive devices:23999}  OCCUPATION: ***  PLOF: {PLOF:24004}  PATIENT GOALS: ***  NEXT MD VISIT: ***  OBJECTIVE:  Note: Objective measures were completed at Evaluation  unless otherwise noted.  DIAGNOSTIC FINDINGS: ***  PATIENT SURVEYS:  {rehab surveys:24030}  COGNITION: Overall cognitive status: {cognition:24006}     SENSATION: {sensation:27233}  EDEMA:  {edema:24020}  MUSCLE LENGTH: Hamstrings: Right *** deg; Left *** deg Debby test: Right *** deg; Left *** deg  POSTURE: {posture:25561}  PALPATION: ***  LOWER EXTREMITY ROM:  {AROM/PROM:27142} ROM Right eval Left eval  Hip flexion    Hip extension    Hip abduction    Hip adduction    Hip internal rotation    Hip external rotation    Knee flexion    Knee extension    Ankle dorsiflexion    Ankle plantarflexion    Ankle inversion    Ankle eversion     (Blank rows = not tested)  LOWER EXTREMITY MMT:  MMT Right eval Left eval  Hip flexion    Hip extension    Hip abduction    Hip adduction    Hip internal rotation    Hip external rotation    Knee flexion    Knee extension    Ankle dorsiflexion    Ankle plantarflexion    Ankle inversion    Ankle eversion     (Blank rows = not tested)  LOWER EXTREMITY SPECIAL TESTS:  {LEspecialtests:26242}  FUNCTIONAL TESTS:  {Functional tests:24029}  GAIT: Distance walked: *** Assistive device utilized: {Assistive devices:23999} Level of assistance: {Levels of assistance:24026} Comments: ***  TREATMENT DATE: ***    PATIENT EDUCATION:  Education details: *** Person educated: {Person educated:25204} Education method: {Education Method:25205} Education comprehension: {Education Comprehension:25206}  HOME EXERCISE PROGRAM: ***  ASSESSMENT:  CLINICAL IMPRESSION: Patient is a *** y.o. *** who was seen today for physical therapy evaluation and treatment for ***.   OBJECTIVE IMPAIRMENTS: {opptimpairments:25111}.   ACTIVITY LIMITATIONS: {activitylimitations:27494}  PARTICIPATION LIMITATIONS:  {participationrestrictions:25113}  PERSONAL FACTORS: {Personal factors:25162} are also affecting patient's functional outcome.   REHAB POTENTIAL: {rehabpotential:25112}  CLINICAL DECISION MAKING: {clinical decision making:25114}  EVALUATION COMPLEXITY: {Evaluation complexity:25115}   GOALS: Goals reviewed with patient? {yes/no:20286}  SHORT TERM GOALS: Target date: *** *** Baseline: Goal status: INITIAL  2.  *** Baseline:  Goal status: INITIAL  3.  *** Baseline:  Goal status: INITIAL  4.  *** Baseline:  Goal status: INITIAL  5.  *** Baseline:  Goal status: INITIAL  6.  *** Baseline:  Goal status: INITIAL  LONG TERM GOALS: Target date: ***  *** Baseline:  Goal status: INITIAL  2.  *** Baseline:  Goal status: INITIAL  3.  *** Baseline:  Goal status: INITIAL  4.  *** Baseline:  Goal status: INITIAL  5.  *** Baseline:  Goal status: INITIAL  6.  *** Baseline:  Goal status: INITIAL   PLAN:  PT FREQUENCY: {rehab frequency:25116}  PT DURATION: {rehab duration:25117}  PLANNED INTERVENTIONS: {rehab planned interventions:25118::97110-Therapeutic exercises,97530- Therapeutic [phone]- Neuromuscular re-education,97535- Self Rjmz,02859- Manual therapy,Patient/Family education}  PLAN FOR NEXT SESSION: PIERRETTE Magda Josette FORBES, PT 11/17/2024, 2:21 PM  "

## 2024-11-19 ENCOUNTER — Ambulatory Visit (HOSPITAL_BASED_OUTPATIENT_CLINIC_OR_DEPARTMENT_OTHER): Admitting: Physical Therapy

## 2024-11-19 ENCOUNTER — Other Ambulatory Visit: Payer: Self-pay

## 2024-11-19 ENCOUNTER — Encounter (HOSPITAL_BASED_OUTPATIENT_CLINIC_OR_DEPARTMENT_OTHER): Payer: Self-pay | Admitting: Physical Therapy

## 2024-11-19 DIAGNOSIS — M6281 Muscle weakness (generalized): Secondary | ICD-10-CM | POA: Diagnosis present

## 2024-11-19 DIAGNOSIS — R1031 Right lower quadrant pain: Secondary | ICD-10-CM | POA: Insufficient documentation

## 2024-11-19 DIAGNOSIS — R262 Difficulty in walking, not elsewhere classified: Secondary | ICD-10-CM | POA: Insufficient documentation

## 2024-11-19 DIAGNOSIS — M25551 Pain in right hip: Secondary | ICD-10-CM | POA: Insufficient documentation

## 2024-11-19 DIAGNOSIS — M25651 Stiffness of right hip, not elsewhere classified: Secondary | ICD-10-CM | POA: Insufficient documentation

## 2024-11-19 NOTE — Therapy (Signed)
 " OUTPATIENT PHYSICAL THERAPY LOWER EXTREMITY EVALUATION   Patient Name: Ann Taylor MRN: [id number] DOB:09/19/1981, 43 y.o., female Today's Date: 11/19/2024  END OF SESSION:  PT End of Session - 11/19/24 1447     Visit Number 1    Number of Visits 25    Date for Recertification  02/09/25    Authorization Type [employer]    Authorization Time Period TBD    PT Start Time 1433    PT Stop Time 1513    PT Time Calculation (min) 40 min    Activity Tolerance Patient tolerated treatment well    Behavior During Therapy WFL for tasks assessed/performed          Past Medical History:  Diagnosis Date   Arrhythmia    Headache(784.0)    Heart murmur    UTI (lower urinary tract infection)    Past Surgical History:  Procedure Laterality Date   BREAST BIOPSY Left 04/17/2023   MM LT BREAST BX W LOC DEV 1ST LESION IMAGE BX SPEC STEREO GUIDE 04/17/2023 [facility]   CESAREAN SECTION     HIP FRACTURE SURGERY Right 12/11/2023   Patient Active Problem List   Diagnosis Date Noted   Hip impingement syndrome, right 11/15/2024   Visit for routine gyn exam 03/27/2023   STD exposure 03/27/2023   Nexplanon  in place 03/27/2023   Bilateral radiating leg pain 02/12/2023   Mood disorder 02/12/2023   Essential hypertension 07/28/2021   Family history of sickle cell anemia 07/28/2021   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   Neuropathy 07/28/2021   Other allergy, initial encounter 07/28/2021   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 07/26/2021   Migraine 07/26/2021    PCP: Duwaine Riggs FNP   REFERRING PROVIDER: Genelle Standing, MD  REFERRING DIAG:  Diagnosis  R10.31 (ICD-10-CM) - Right groin pain    THERAPY DIAG:  Pain in right hip  Difficulty in walking, not elsewhere classified  Muscle weakness (generalized)  Stiffness of right hip, not elsewhere classified  Rationale for Evaluation and Treatment: Rehabilitation  ONSET DATE: R hip pincer debridement and labral reconstruction  11/15/24  SUBJECTIVE:   SUBJECTIVE STATEMENT:  Took my pain medicine about an hour before therapy but it still hurts. Tend to drag it when its hurting. Not using the crutches when its not hurting. Its still early, but I still feel really exhausted after moving around. Might have done a little too much cleaning the kitchen the other day, felt pretty tired after and had a lot of pain too. Still hurts to lift leg, I just make sure to take my time. They told me to hold off on showering but I ended up doing one the other day anyway.   PERTINENT HISTORY: See above  PAIN:  Are you having pain? Yes: NPRS scale: 8/10 Pain location: R hip  Pain description: throbbing, sharp, tingling down whole leg, pain in low back  Aggravating factors: moving it the wrong way  Relieving factors: taking right med combo for pain  PRECAUTIONS: Other: WBAT per op note   RED FLAGS: None   WEIGHT BEARING RESTRICTIONS: No  FALLS:  Has patient fallen in last 6 months? No  LIVING ENVIRONMENT: Lives with: lives with their family Lives in: House/apartment Stairs: no stairs  Has following equipment at home: Crutches  OCCUPATION: front desk [employer]   PLOF: Independent, Independent with basic ADLs, Independent with gait, and Independent with transfers  PATIENT GOALS: get back to work, heal correctly, be able to drive   NEXT MD  VISIT: 11/26/24 with surgeon  OBJECTIVE:  Note: Objective measures were completed at Evaluation unless otherwise noted.    PATIENT SURVEYS:  LEFS  Extreme difficulty/unable (0), Quite a bit of difficulty (1), Moderate difficulty (2), Little difficulty (3), No difficulty (4)  Survey date:   11/19/24  Any of your usual work, housework or school activities 1  2. Usual hobbies, recreational or sporting activities 1  3. Getting into/out of the bath 1  4. Walking between rooms 1  5. Putting on socks/shoes 0  6. Squatting  0  7. Lifting an object, like a bag of groceries from the floor 0   8. Performing light activities around your home 1  9. Performing heavy activities around your home 0  10. Getting into/out of a car 1  11. Walking 2 blocks 0  12. Walking 1 mile 0  13. Going up/down 10 stairs (1 flight) 0  14. Standing for 1 hour 0  15.  sitting for 1 hour 4  16. Running on even ground 0  17. Running on uneven ground 0  18. Making sharp turns while running fast 0  19. Hopping  0  20. Rolling over in bed 1  Score total:  11/80     COGNITION: Overall cognitive status: Within functional limits for tasks assessed     SENSATION:  Reports tingling in entire R LE     LOWER EXTREMITY ROM:  Passive ROM Right eval Left eval  Hip flexion 90   Hip extension    Hip abduction WNL    Hip adduction    Hip internal rotation 15   Hip external rotation Deferred    Knee flexion    Knee extension    Ankle dorsiflexion    Ankle plantarflexion    Ankle inversion    Ankle eversion     (Blank rows = not tested)  LOWER EXTREMITY MMT:  MMT Right eval Left eval  Hip flexion    Hip extension    Hip abduction    Hip adduction    Hip internal rotation    Hip external rotation    Knee flexion 4+ 4+  Knee extension 5 5  Ankle dorsiflexion    Ankle plantarflexion    Ankle inversion    Ankle eversion     (Blank rows = not tested)      GAIT: Distance walked: in clinic distances  Assistive device utilized: Crutches Level of assistance: Modified independence Comments: antalgic, dragging R LE due to pain, limited WB surgical LE                                                                                                                                 TREATMENT DATE:   11/19/24  Eval, POC, HEP  Changed bandage- no signs of infection, inflammation,  excessive drainage from incision site. Dressed incision with 3 small tegaderm dressings, placed large tegaderm over all three for extra  protection  HEP review/discussion  PATIENT EDUCATION:  Education  details: exam findings, POC, HEP, general expectations and timelines of recovery with PT  Person educated: Patient Education method: Explanation, Demonstration, and Handouts Education comprehension: verbalized understanding, returned demonstration, and needs further education  HOME EXERCISE PROGRAM: Access Code: [email or web address] URL: [email or web address] Date: 11/17/2024 Prepared by: Josette Rough  Exercises - Seated Ankle Pumps  - 2-3 x daily - 7 x weekly - 1 sets - 20 reps - Supine Quad Set  - 2-3 x daily - 7 x weekly - 1 sets - 10 reps - Hooklying Gluteal Sets  - 2-3 x daily - 7 x weekly - 1 sets - 10 reps - Hooklying Heel Slide  - 2-3 x daily - 7 x weekly - 1 sets - 10 reps - Supine Transversus Abdominis Bracing - Hands on Stomach  - 2-3 x daily - 7 x weekly - 1 sets - 10 reps - Supine Hip Adduction Isometric with Ball  - 2-3 x daily - 7 x weekly - 1 sets - 10 reps  ASSESSMENT:  CLINICAL IMPRESSION:  Patient is a 43 y.o. F who was seen today for physical therapy evaluation and treatment for skilled PT care following her surgery on 11/15/24. Objectives as above. Will make every effort to address pain and assist in return to optimal desired level of function moving forward.    OBJECTIVE IMPAIRMENTS: Abnormal gait, decreased activity tolerance, decreased balance, decreased knowledge of use of DME, decreased mobility, difficulty walking, decreased ROM, decreased strength, hypomobility, increased fascial restrictions, impaired perceived functional ability, impaired flexibility, and pain.   ACTIVITY LIMITATIONS: carrying, lifting, bending, standing, squatting, sleeping, stairs, transfers, bed mobility, and locomotion level  PARTICIPATION LIMITATIONS: meal prep, cleaning, laundry, driving, shopping, community activity, and occupation  PERSONAL FACTORS: Past/current experiences and Time since onset of injury/illness/exacerbation are also affecting patient's functional outcome.    REHAB POTENTIAL: Good  CLINICAL DECISION MAKING: Stable/uncomplicated  EVALUATION COMPLEXITY: Low   GOALS: Goals reviewed with patient? No  SHORT TERM GOALS: Target date: 12/29/2024   1. Will be independent with appropriate progressive HEP  Baseline: Goal status: INITIAL  2.  Gait mechanics to have normalized with LRAD and pain with ambulation to be no more than 3/10 Baseline:  Goal status: INITIAL  3.  Hip AROM and PROM to be WNL with no extra pain  Baseline:  Goal status: INITIAL  4.  Will be independent in edema management program  Baseline:  Goal status: INITIAL  LONG TERM GOALS: Target date: 02/09/2025   1. MMT to be 5/5 in all tested groups Baseline:  Goal status: INITIAL  2.   Will have been able to return to all desired work and daily functional activities without increased pain  Baseline:  Goal status: INITIAL  3.  Will be able to climb flight of steps with reciprocal pattern and no increase in pain  Baseline:  Goal status: INITIAL  4.  Will be compliant with appropriate progressive return to gym level exercise programming without increased pain   Baseline:  Goal status: INITIAL    PLAN:  PT FREQUENCY: 2x/week  PT DURATION: 12 weeks  PLANNED INTERVENTIONS: 97164- PT Re-evaluation, 97750- Physical Performance Testing, 97110-Therapeutic exercises, 97530- Therapeutic activity, [id number]- Neuromuscular re-education, 97535- Self Care, 02859- Manual therapy, [phone]- Gait training, and [phone]- Aquatic Therapy  PLAN FOR NEXT SESSION: per protocol, check dressings, update HEP   Josette Rough, PT, DPT 11/19/2024 3:28 PM "

## 2024-11-19 NOTE — Therapy (Deleted)
 " OUTPATIENT PHYSICAL THERAPY LOWER EXTREMITY EVALUATION   Patient Name: Ann Taylor MRN: [id number] DOB:09/19/1981, 43 y.o., female Today's Date: 11/19/2024  END OF SESSION:  PT End of Session - 11/19/24 1447     Visit Number 1    Number of Visits 25    Date for Recertification  02/09/25    Authorization Type [employer]    Authorization Time Period TBD    PT Start Time 1433    PT Stop Time 1513    PT Time Calculation (min) 40 min    Activity Tolerance Patient tolerated treatment well    Behavior During Therapy WFL for tasks assessed/performed          Past Medical History:  Diagnosis Date   Arrhythmia    Headache(784.0)    Heart murmur    UTI (lower urinary tract infection)    Past Surgical History:  Procedure Laterality Date   BREAST BIOPSY Left 04/17/2023   MM LT BREAST BX W LOC DEV 1ST LESION IMAGE BX SPEC STEREO GUIDE 04/17/2023 [facility]   CESAREAN SECTION     HIP FRACTURE SURGERY Right 12/11/2023   Patient Active Problem List   Diagnosis Date Noted   Hip impingement syndrome, right 11/15/2024   Visit for routine gyn exam 03/27/2023   STD exposure 03/27/2023   Nexplanon  in place 03/27/2023   Bilateral radiating leg pain 02/12/2023   Mood disorder 02/12/2023   Essential hypertension 07/28/2021   Family history of sickle cell anemia 07/28/2021   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   Neuropathy 07/28/2021   Other allergy, initial encounter 07/28/2021   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 07/26/2021   Migraine 07/26/2021    PCP: Duwaine Riggs FNP   REFERRING PROVIDER: Genelle Standing, MD  REFERRING DIAG:  Diagnosis  R10.31 (ICD-10-CM) - Right groin pain    THERAPY DIAG:  Pain in right hip  Difficulty in walking, not elsewhere classified  Muscle weakness (generalized)  Stiffness of right hip, not elsewhere classified  Rationale for Evaluation and Treatment: Rehabilitation  ONSET DATE: R hip pincer debridement and labral reconstruction  11/15/24  SUBJECTIVE:   SUBJECTIVE STATEMENT:  Took my pain medicine about an hour before therapy but it still hurts. Tend to drag it when its hurting. Not using the crutches when its not hurting. Its still early, but I still feel really exhausted after moving around. Might have done a little too much cleaning the kitchen the other day, felt pretty tired after and had a lot of pain too. Still hurts to lift leg, I just make sure to take my time. They told me to hold off on showering but I ended up doing one the other day anyway.   PERTINENT HISTORY: See above  PAIN:  Are you having pain? Yes: NPRS scale: 8/10 Pain location: R hip  Pain description: throbbing, sharp, tingling down whole leg, pain in low back  Aggravating factors: moving it the wrong way  Relieving factors: taking right med combo for pain  PRECAUTIONS: Other: WBAT per op note   RED FLAGS: None   WEIGHT BEARING RESTRICTIONS: No  FALLS:  Has patient fallen in last 6 months? No  LIVING ENVIRONMENT: Lives with: lives with their family Lives in: House/apartment Stairs: no stairs  Has following equipment at home: Crutches  OCCUPATION: front desk [employer]   PLOF: Independent, Independent with basic ADLs, Independent with gait, and Independent with transfers  PATIENT GOALS: get back to work, heal correctly, be able to drive   NEXT MD  VISIT: 11/26/24 with surgeon  OBJECTIVE:  Note: Objective measures were completed at Evaluation unless otherwise noted.    PATIENT SURVEYS:  LEFS  Extreme difficulty/unable (0), Quite a bit of difficulty (1), Moderate difficulty (2), Little difficulty (3), No difficulty (4)  Survey date:   11/19/24  Any of your usual work, housework or school activities 1  2. Usual hobbies, recreational or sporting activities 1  3. Getting into/out of the bath 1  4. Walking between rooms 1  5. Putting on socks/shoes 0  6. Squatting  0  7. Lifting an object, like a bag of groceries from the floor 0   8. Performing light activities around your home 1  9. Performing heavy activities around your home 0  10. Getting into/out of a car 1  11. Walking 2 blocks 0  12. Walking 1 mile 0  13. Going up/down 10 stairs (1 flight) 0  14. Standing for 1 hour 0  15.  sitting for 1 hour 4  16. Running on even ground 0  17. Running on uneven ground 0  18. Making sharp turns while running fast 0  19. Hopping  0  20. Rolling over in bed 1  Score total:  11/80     COGNITION: Overall cognitive status: Within functional limits for tasks assessed     SENSATION:  Reports tingling in entire R LE     LOWER EXTREMITY ROM:  Passive ROM Right eval Left eval  Hip flexion 90   Hip extension    Hip abduction WNL    Hip adduction    Hip internal rotation 15   Hip external rotation Deferred    Knee flexion    Knee extension    Ankle dorsiflexion    Ankle plantarflexion    Ankle inversion    Ankle eversion     (Blank rows = not tested)  LOWER EXTREMITY MMT:  MMT Right eval Left eval  Hip flexion    Hip extension    Hip abduction    Hip adduction    Hip internal rotation    Hip external rotation    Knee flexion 4+ 4+  Knee extension 5 5  Ankle dorsiflexion    Ankle plantarflexion    Ankle inversion    Ankle eversion     (Blank rows = not tested)      GAIT: Distance walked: in clinic distances  Assistive device utilized: Crutches Level of assistance: Modified independence Comments: antalgic, dragging R LE due to pain, limited WB surgical LE                                                                                                                                 TREATMENT DATE:   11/19/24  Eval, POC, HEP  Changed bandage- no signs of infection, inflammation,  excessive drainage from incision site. Dressed incision with 3 small tegaderm dressings, placed large tegaderm over all three for extra  protection  HEP review/discussion  PATIENT EDUCATION:  Education  details: exam findings, POC, HEP, general expectations and timelines of recovery with PT  Person educated: Patient Education method: Explanation, Demonstration, and Handouts Education comprehension: verbalized understanding, returned demonstration, and needs further education  HOME EXERCISE PROGRAM: Access Code: [email or web address] URL: [email or web address] Date: 11/17/2024 Prepared by: Josette Rough  Exercises - Seated Ankle Pumps  - 2-3 x daily - 7 x weekly - 1 sets - 20 reps - Supine Quad Set  - 2-3 x daily - 7 x weekly - 1 sets - 10 reps - Hooklying Gluteal Sets  - 2-3 x daily - 7 x weekly - 1 sets - 10 reps - Hooklying Heel Slide  - 2-3 x daily - 7 x weekly - 1 sets - 10 reps - Supine Transversus Abdominis Bracing - Hands on Stomach  - 2-3 x daily - 7 x weekly - 1 sets - 10 reps - Supine Hip Adduction Isometric with Ball  - 2-3 x daily - 7 x weekly - 1 sets - 10 reps  ASSESSMENT:  CLINICAL IMPRESSION:  Patient is a 43 y.o. F who was seen today for physical therapy evaluation and treatment for skilled PT care following her surgery on 11/15/24. Objectives as above. Will make every effort to address pain and assist in return to optimal desired level of function moving forward.    OBJECTIVE IMPAIRMENTS: Abnormal gait, decreased activity tolerance, decreased balance, decreased knowledge of use of DME, decreased mobility, difficulty walking, decreased ROM, decreased strength, hypomobility, increased fascial restrictions, impaired perceived functional ability, impaired flexibility, and pain.   ACTIVITY LIMITATIONS: carrying, lifting, bending, standing, squatting, sleeping, stairs, transfers, bed mobility, and locomotion level  PARTICIPATION LIMITATIONS: meal prep, cleaning, laundry, driving, shopping, community activity, and occupation  PERSONAL FACTORS: Past/current experiences and Time since onset of injury/illness/exacerbation are also affecting patient's functional outcome.    REHAB POTENTIAL: Good  CLINICAL DECISION MAKING: Stable/uncomplicated  EVALUATION COMPLEXITY: Low   GOALS: Goals reviewed with patient? No  SHORT TERM GOALS: Target date: 12/29/2024   1. Will be independent with appropriate progressive HEP  Baseline: Goal status: INITIAL  2.  Gait mechanics to have normalized with LRAD and pain with ambulation to be no more than 3/10 Baseline:  Goal status: INITIAL  3.  Hip AROM and PROM to be WNL with no extra pain  Baseline:  Goal status: INITIAL  4.  Will be independent in edema management program  Baseline:  Goal status: INITIAL  LONG TERM GOALS: Target date: 02/09/2025   1. MMT to be 5/5 in all tested groups Baseline:  Goal status: INITIAL  2.   Will have been able to return to all desired work and daily functional activities without increased pain  Baseline:  Goal status: INITIAL  3.  Will be able to climb flight of steps with reciprocal pattern and no increase in pain  Baseline:  Goal status: INITIAL  4.  Will be compliant with appropriate progressive return to gym level exercise programming without increased pain   Baseline:  Goal status: INITIAL    PLAN:  PT FREQUENCY: 2x/week  PT DURATION: 12 weeks  PLANNED INTERVENTIONS: 97164- PT Re-evaluation, 97750- Physical Performance Testing, 97110-Therapeutic exercises, 97530- Therapeutic activity, [id number]- Neuromuscular re-education, 97535- Self Care, [id number]- Manual therapy, [phone]- Gait training, and [phone]- Aquatic Therapy  PLAN FOR NEXT SESSION: per protocol, check dressings, update HEP   Josette Rough, PT, DPT 11/19/2024 3:28 PM "

## 2024-11-23 ENCOUNTER — Other Ambulatory Visit (HOSPITAL_BASED_OUTPATIENT_CLINIC_OR_DEPARTMENT_OTHER): Payer: Self-pay | Admitting: Student

## 2024-11-23 MED ORDER — HYDROCODONE-ACETAMINOPHEN 5-325 MG PO TABS: 1.0000 | ORAL_TABLET | Freq: Four times a day (QID) | ORAL | 0 refills | Status: AC | PRN

## 2024-11-26 ENCOUNTER — Encounter (HOSPITAL_BASED_OUTPATIENT_CLINIC_OR_DEPARTMENT_OTHER): Payer: Self-pay | Admitting: Physical Therapy

## 2024-11-26 ENCOUNTER — Ambulatory Visit (HOSPITAL_BASED_OUTPATIENT_CLINIC_OR_DEPARTMENT_OTHER): Admitting: Orthopaedic Surgery

## 2024-11-26 ENCOUNTER — Ambulatory Visit (HOSPITAL_BASED_OUTPATIENT_CLINIC_OR_DEPARTMENT_OTHER): Attending: Orthopaedic Surgery | Admitting: Physical Therapy

## 2024-11-26 DIAGNOSIS — M25851 Other specified joint disorders, right hip: Secondary | ICD-10-CM

## 2024-11-26 DIAGNOSIS — M6281 Muscle weakness (generalized): Secondary | ICD-10-CM | POA: Diagnosis present

## 2024-11-26 DIAGNOSIS — M25651 Stiffness of right hip, not elsewhere classified: Secondary | ICD-10-CM | POA: Diagnosis present

## 2024-11-26 DIAGNOSIS — R262 Difficulty in walking, not elsewhere classified: Secondary | ICD-10-CM | POA: Diagnosis present

## 2024-11-26 DIAGNOSIS — M25551 Pain in right hip: Secondary | ICD-10-CM | POA: Insufficient documentation

## 2024-11-26 NOTE — Progress Notes (Signed)
 "                                Post Operative Evaluation    Procedure/Date of Surgery: Right hip labral reconstruction 12/22  Interval History:   Status post right hip labral reconstruction doing well.  She states that she feels a difference between her first surgery.  Overall she is continuing to improve   PMH/PSH/Family History/Social History/Meds/Allergies:    Past Medical History:  Diagnosis Date   Arrhythmia    Headache(784.0)    Heart murmur    UTI (lower urinary tract infection)    Past Surgical History:  Procedure Laterality Date   BREAST BIOPSY Left 04/17/2023   MM LT BREAST BX W LOC DEV 1ST LESION IMAGE BX SPEC STEREO GUIDE 04/17/2023 [facility]   CESAREAN SECTION     HIP FRACTURE SURGERY Right 12/11/2023   Social History   Socioeconomic History   Marital status: Single    Spouse name: Not on file   Number of children: Not on file   Years of education: Not on file   Highest education level: Not on file  Occupational History   Not on file  Tobacco Use   Smoking status: Never   Smokeless tobacco: Never  Vaping Use   Vaping status: Never Used  Substance and Sexual Activity   Alcohol use: No   Drug use: No   Sexual activity: Yes    Birth control/protection: None  Other Topics Concern   Not on file  Social History Narrative   Not on file   Social Drivers of Health   Tobacco Use: Low Risk (11/26/2024)   Patient History    Smoking Tobacco Use: Never    Smokeless Tobacco Use: Never    Passive Exposure: Not on file  Financial Resource Strain: At Risk (04/25/2023)   Received from [employer]    Financial Resource Strain: 2  Food Insecurity: Not on file (04/25/2023)  Transportation Needs: Not at Risk (04/25/2023)   Received from [employer] Needs    Transportation: 1  Physical Activity: Not on File (03/10/2023)   [facility]   Physical Activity    Physical Activity: 0  Stress: Not on File (03/10/2023)    Received from [facility]   Stress    Stress: 0  Social Connections: Not on File (07/29/2023)   Received from [facility]   Social Connections    Connectedness: 0  Depression (PHQ2-9): Low Risk (03/27/2023)   Depression (PHQ2-9)    PHQ-2 Score: 4  Alcohol Screen: Not on file  Housing: Not on file  Utilities: Not on file  Health Literacy: Not on file   Family History  Problem Relation Age of Onset   Alcohol abuse Neg Hx    Arthritis Neg Hx    Asthma Neg Hx    Birth defects Neg Hx    Cancer Neg Hx    COPD Neg Hx    Depression Neg Hx    Diabetes Neg Hx    Drug abuse Neg Hx    Early death Neg Hx    Hearing loss Neg Hx    Heart disease Neg Hx    Hyperlipidemia Neg Hx    Hypertension Neg Hx    Kidney disease Neg Hx    Learning disabilities Neg Hx    Mental illness Neg Hx    Mental retardation Neg Hx  Miscarriages / Stillbirths Neg Hx    Stroke Neg Hx    Vision loss Neg Hx    Allergies[1] Current Outpatient Medications  Medication Sig Dispense Refill   HYDROmorphone  (DILAUDID ) 2 MG tablet Take 1 tablet (2 mg total) by mouth every 4 (four) hours as needed for severe pain (pain score 7-10). 25 tablet 0   aspirin  EC 325 MG tablet Take 1 tablet (325 mg total) by mouth daily. 14 tablet 0   cetirizine (ZYRTEC) 10 MG tablet Take 10 mg by mouth daily.     diphenhydrAMINE (BENADRYL) 25 MG tablet Take 25 mg by mouth every 6 (six) hours as needed.     gabapentin  (NEURONTIN ) 300 MG capsule Take 300 mg by mouth 3 (three) times daily.     hydrochlorothiazide (HYDRODIURIL) 25 MG tablet Take 25 mg by mouth daily.     HYDROcodone -acetaminophen  (NORCO/VICODIN) 5-325 MG tablet Take 1 tablet by mouth every 6 (six) hours as needed for moderate pain (pain score 4-6). 20 tablet 0   HYDROmorphone  (DILAUDID ) 2 MG tablet Take 1 tablet (2 mg total) by mouth every 12 (twelve) hours as needed for up to 13 days for severe pain (pain score 7-10). 25 tablet 0   No current facility-administered medications for this  visit.   No results found.  Review of Systems:   A ROS was performed including pertinent positives and negatives as documented in the HPI.   Musculoskeletal Exam:    There were no vitals taken for this visit.  Right hip incisions are well-appearing without erythema or drainage.  30 degrees internal/external rotation of the right hip are without pain.  Walks with mildly antalgic gait  Imaging:      I personally reviewed and interpreted the radiographs.   Assessment:   2 weeks status post right hip arthroscopic labral reconstruction doing well.  This time should continue to progress her range of motion and strengthening according to the reconstruction protocol plan to see her back in 4 weeks for reassessment  Plan :    - Return to clinic 4 weeks for reassessment      I personally saw and evaluated the patient, and participated in the management and treatment plan.  Elspeth Parker, MD Attending Physician, Orthopedic Surgery  This document was dictated using Dragon voice recognition software. A reasonable attempt at proof reading has been made to minimize errors.    [1]  Allergies Allergen Reactions   Oxycodone  Hives and Itching   Shellfish Allergy Anaphylaxis   Shellfish Protein-Containing Drug Products Anaphylaxis   Grass Pollen(K-O-R-T-Swt Vern) Hives   Codone [Hydrocodone ] Itching   Codeine Hives, Itching and Rash   Tramadol  Hives, Itching and Anxiety   "

## 2024-11-26 NOTE — Therapy (Signed)
 " OUTPATIENT PHYSICAL THERAPY LOWER EXTREMITY EVALUATION   Patient Name: Ann Taylor MRN: [id number] DOB:Apr 04, 1981, 44 y.o., female Today's Date: 11/19/2024  END OF SESSION:  PT End of Session - 11/26/24 0851     Visit Number 2    Number of Visits 25    Date for Recertification  02/09/25    Authorization Type [employer]    Authorization Time Period 11/19/24-01/18/25    Authorization - Visit Number 2    Authorization - Number of Visits 12    PT Start Time 0850    PT Stop Time 0929    PT Time Calculation (min) 39 min    Activity Tolerance Patient tolerated treatment well    Behavior During Therapy [facility] for tasks assessed/performed           Past Medical History:  Diagnosis Date   Arrhythmia    Headache(784.0)    Heart murmur    UTI (lower urinary tract infection)    Past Surgical History:  Procedure Laterality Date   BREAST BIOPSY Left 04/17/2023   MM LT BREAST BX W LOC DEV 1ST LESION IMAGE BX SPEC STEREO GUIDE 04/17/2023 [facility]   CESAREAN SECTION     HIP FRACTURE SURGERY Right 12/11/2023   Patient Active Problem List   Diagnosis Date Noted   Hip impingement syndrome, right 11/15/2024   Visit for routine gyn exam 03/27/2023   STD exposure 03/27/2023   Nexplanon  in place 03/27/2023   Bilateral radiating leg pain 02/12/2023   Mood disorder 02/12/2023   Essential hypertension 07/28/2021   Family history of sickle cell anemia 07/28/2021   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   Neuropathy 07/28/2021   Other allergy, initial encounter 07/28/2021   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 07/26/2021   Migraine 07/26/2021    PCP: Duwaine Riggs FNP   REFERRING PROVIDER: Genelle Standing, MD  REFERRING DIAG:  Diagnosis  R10.31 (ICD-10-CM) - Right groin pain    THERAPY DIAG:  Pain in right hip  Difficulty in walking, not elsewhere classified  Muscle weakness (generalized)  Stiffness of right hip, not elsewhere classified  Rationale for Evaluation and Treatment:  Rehabilitation  ONSET DATE: R hip pincer debridement and labral reconstruction 11/15/24  SUBJECTIVE:   SUBJECTIVE STATEMENT: Just took some meds about 20 min ago  PERTINENT HISTORY: See above  PAIN:  Are you having pain? Yes: NPRS scale: 8/10 Pain location: R hip  Pain description: throbbing, sharp, tingling down whole leg, pain in low back  Aggravating factors: moving it the wrong way  Relieving factors: taking right med combo for pain  PRECAUTIONS: Other: WBAT per op note   RED FLAGS: None   WEIGHT BEARING RESTRICTIONS: No  FALLS:  Has patient fallen in last 6 months? No  LIVING ENVIRONMENT: Lives with: lives with their family Lives in: House/apartment Stairs: no stairs  Has following equipment at home: Crutches  OCCUPATION: front desk [employer]   PLOF: Independent, Independent with basic ADLs, Independent with gait, and Independent with transfers  PATIENT GOALS: get back to work, heal correctly, be able to drive   NEXT MD VISIT: 07/01/72 with surgeon  OBJECTIVE:  Note: Objective measures were completed at Evaluation unless otherwise noted.    PATIENT SURVEYS:  LEFS  Extreme difficulty/unable (0), Quite a bit of difficulty (1), Moderate difficulty (2), Little difficulty (3), No difficulty (4)  Survey date:   11/19/24  Any of your usual work, housework or school activities 1  2. Usual hobbies, recreational or sporting activities 1  3.  Getting into/out of the bath 1  4. Walking between rooms 1  5. Putting on socks/shoes 0  6. Squatting  0  7. Lifting an object, like a bag of groceries from the floor 0  8. Performing light activities around your home 1  9. Performing heavy activities around your home 0  10. Getting into/out of a car 1  11. Walking 2 blocks 0  12. Walking 1 mile 0  13. Going up/down 10 stairs (1 flight) 0  14. Standing for 1 hour 0  15.  sitting for 1 hour 4  16. Running on even ground 0  17. Running on uneven ground 0  18. Making sharp turns  while running fast 0  19. Hopping  0  20. Rolling over in bed 1  Score total:  11/80     COGNITION: Overall cognitive status: Within functional limits for tasks assessed     SENSATION:  Reports tingling in entire R LE     LOWER EXTREMITY ROM:  Passive ROM Right eval Left eval  Hip flexion 90   Hip extension    Hip abduction WNL    Hip adduction    Hip internal rotation 15   Hip external rotation Deferred    Knee flexion    Knee extension    Ankle dorsiflexion    Ankle plantarflexion    Ankle inversion    Ankle eversion     (Blank rows = not tested)  LOWER EXTREMITY MMT:  MMT Right eval Left eval  Hip flexion    Hip extension    Hip abduction    Hip adduction    Hip internal rotation    Hip external rotation    Knee flexion 4+ 4+  Knee extension 5 5  Ankle dorsiflexion    Ankle plantarflexion    Ankle inversion    Ankle eversion     (Blank rows = not tested)      GAIT: Distance walked: in clinic distances  Assistive device utilized: Crutches Level of assistance: Modified independence Comments: antalgic, dragging R LE due to pain, limited WB surgical LE                                                                                                                                 TREATMENT DATE:  11/26/24 Changed bandages-no s/s of infection Bike 5 min no resist, pendulum due to anterior impingement Gait- glut set for hip ext, upright trunk & avoiding limp Prone position, static, + glut set, HS curls Manual LAD grad 1-2 with movement 0-30 abd   11/19/24  Eval, POC, HEP  Changed bandage- no signs of infection, inflammation,  excessive drainage from incision site. Dressed incision with 3 small tegaderm dressings, placed large tegaderm over all three for extra protection  HEP review/discussion  PATIENT EDUCATION:  Education details: exam findings, POC, HEP, general expectations and timelines of recovery with PT  Person educated:  Patient  Education method: Explanation, Demonstration, and Handouts Education comprehension: verbalized understanding, returned demonstration, and needs further education  HOME EXERCISE PROGRAM: Access Code: [email or web address] URL: [email or web address] Date: 11/17/2024 Prepared by: Josette Rough    ASSESSMENT:  CLINICAL IMPRESSION:  Continued progression as expected. Good glut contraction. Still with some anterior impingement with bike revolutions so stayed within pendulum. Gait looks excellent  when shy of fatigue. Encouraged awareness of fatigue and rest breaks. May return to work and we discussed importance of proper movement/stretches that will be necessary throughout the day.   OBJECTIVE IMPAIRMENTS: Abnormal gait, decreased activity tolerance, decreased balance, decreased knowledge of use of DME, decreased mobility, difficulty walking, decreased ROM, decreased strength, hypomobility, increased fascial restrictions, impaired perceived functional ability, impaired flexibility, and pain.   ACTIVITY LIMITATIONS: carrying, lifting, bending, standing, squatting, sleeping, stairs, transfers, bed mobility, and locomotion level  PARTICIPATION LIMITATIONS: meal prep, cleaning, laundry, driving, shopping, community activity, and occupation  PERSONAL FACTORS: Past/current experiences and Time since onset of injury/illness/exacerbation are also affecting patient's functional outcome.   REHAB POTENTIAL: Good  CLINICAL DECISION MAKING: Stable/uncomplicated  EVALUATION COMPLEXITY: Low   GOALS: Goals reviewed with patient? No  SHORT TERM GOALS: Target date: 12/29/2024   1. Will be independent with appropriate progressive HEP  Baseline: Goal status: INITIAL  2.  Gait mechanics to have normalized with LRAD and pain with ambulation to be no more than 3/10 Baseline:  Goal status: INITIAL  3.  Hip AROM and PROM to be WNL with no extra pain  Baseline:  Goal status: INITIAL  4.   Will be independent in edema management program  Baseline:  Goal status: INITIAL  LONG TERM GOALS: Target date: 02/09/2025   1. MMT to be 5/5 in all tested groups Baseline:  Goal status: INITIAL  2.   Will have been able to return to all desired work and daily functional activities without increased pain  Baseline:  Goal status: INITIAL  3.  Will be able to climb flight of steps with reciprocal pattern and no increase in pain  Baseline:  Goal status: INITIAL  4.  Will be compliant with appropriate progressive return to gym level exercise programming without increased pain   Baseline:  Goal status: INITIAL    PLAN:  PT FREQUENCY: 2x/week  PT DURATION: 12 weeks  PLANNED INTERVENTIONS: 97164- PT Re-evaluation, 97750- Physical Performance Testing, 97110-Therapeutic exercises, 97530- Therapeutic activity, [id number]- Neuromuscular re-education, 97535- Self Care, 02859- Manual therapy, [id number]- Gait training, and [phone]- Aquatic Therapy  PLAN FOR NEXT SESSION: per protocol  Shailene Demonbreun C. Agnieszka Newhouse PT, DPT 11/26/2024 10:15 AM  "

## 2024-12-01 ENCOUNTER — Encounter (HOSPITAL_BASED_OUTPATIENT_CLINIC_OR_DEPARTMENT_OTHER): Payer: Self-pay | Admitting: Orthopaedic Surgery

## 2024-12-03 ENCOUNTER — Ambulatory Visit (HOSPITAL_BASED_OUTPATIENT_CLINIC_OR_DEPARTMENT_OTHER)

## 2024-12-03 ENCOUNTER — Encounter (HOSPITAL_BASED_OUTPATIENT_CLINIC_OR_DEPARTMENT_OTHER): Payer: Self-pay

## 2024-12-03 DIAGNOSIS — M6281 Muscle weakness (generalized): Secondary | ICD-10-CM

## 2024-12-03 DIAGNOSIS — M25551 Pain in right hip: Secondary | ICD-10-CM

## 2024-12-03 DIAGNOSIS — R262 Difficulty in walking, not elsewhere classified: Secondary | ICD-10-CM

## 2024-12-03 DIAGNOSIS — M25651 Stiffness of right hip, not elsewhere classified: Secondary | ICD-10-CM

## 2024-12-03 NOTE — Therapy (Signed)
 " OUTPATIENT PHYSICAL THERAPY LOWER EXTREMITY TREATMENT   Patient Name: Ann Taylor MRN: [id number] DOB:[birth year], 44 y.o., female Today's Date: 11/19/2024  END OF SESSION:  PT End of Session - 12/03/24 1529     Visit Number 3    Number of Visits 25    Date for Recertification  02/09/25    Authorization Type [employer]    Authorization Time Period 11/19/24-01/18/25    Authorization - Visit Number 3    Authorization - Number of Visits 12    PT Start Time 1516    PT Stop Time 1600    PT Time Calculation (min) 44 min    Activity Tolerance Patient tolerated treatment well    Behavior During Therapy [facility] for tasks assessed/performed            Past Medical History:  Diagnosis Date   Arrhythmia    Headache(784.0)    Heart murmur    UTI (lower urinary tract infection)    Past Surgical History:  Procedure Laterality Date   BREAST BIOPSY Left 04/17/2023   MM LT BREAST BX W LOC DEV 1ST LESION IMAGE BX SPEC STEREO GUIDE 04/17/2023 [facility]   CESAREAN SECTION     HIP FRACTURE SURGERY Right 12/11/2023   Patient Active Problem List   Diagnosis Date Noted   Hip impingement syndrome, right 11/15/2024   Visit for routine gyn exam 03/27/2023   STD exposure 03/27/2023   Nexplanon  in place 03/27/2023   Bilateral radiating leg pain 02/12/2023   Mood disorder 02/12/2023   Essential hypertension 07/28/2021   Family history of sickle cell anemia 07/28/2021   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   Neuropathy 07/28/2021   Other allergy, initial encounter 07/28/2021   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 07/26/2021   Migraine 07/26/2021    PCP: Duwaine Riggs FNP   REFERRING PROVIDER: Genelle Standing, MD  REFERRING DIAG:  Diagnosis  R10.31 (ICD-10-CM) - Right groin pain    THERAPY DIAG:  Pain in right hip  Difficulty in walking, not elsewhere classified  Muscle weakness (generalized)  Stiffness of right hip, not elsewhere classified  Rationale for Evaluation and Treatment:  Rehabilitation  ONSET DATE: R hip pincer debridement and labral reconstruction 11/15/24  SUBJECTIVE:   SUBJECTIVE STATEMENT: Pt reports her hip has been hurting all day today, unsure why. I've been taking it easy all day. Pt arrives without brace nor AD.   PERTINENT HISTORY: See above  PAIN:  Are you having pain? Yes: NPRS scale: 7/10 Pain location: anterior R hip  Pain description: throbbing, sharp, tingling down whole leg, pain in low back  Aggravating factors: moving it the wrong way  Relieving factors: taking right med combo for pain  PRECAUTIONS: Other: WBAT per op note   RED FLAGS: None   WEIGHT BEARING RESTRICTIONS: No  FALLS:  Has patient fallen in last 6 months? No  LIVING ENVIRONMENT: Lives with: lives with their family Lives in: House/apartment Stairs: no stairs  Has following equipment at home: Crutches  OCCUPATION: front desk [employer]   PLOF: Independent, Independent with basic ADLs, Independent with gait, and Independent with transfers  PATIENT GOALS: get back to work, heal correctly, be able to drive   NEXT MD VISIT: 07/01/72 with surgeon  OBJECTIVE:  Note: Objective measures were completed at Evaluation unless otherwise noted.    PATIENT SURVEYS:  LEFS  Extreme difficulty/unable (0), Quite a bit of difficulty (1), Moderate difficulty (2), Little difficulty (3), No difficulty (4)  Survey date:   11/19/24  Any  of your usual work, housework or school activities 1  2. Usual hobbies, recreational or sporting activities 1  3. Getting into/out of the bath 1  4. Walking between rooms 1  5. Putting on socks/shoes 0  6. Squatting  0  7. Lifting an object, like a bag of groceries from the floor 0  8. Performing light activities around your home 1  9. Performing heavy activities around your home 0  10. Getting into/out of a car 1  11. Walking 2 blocks 0  12. Walking 1 mile 0  13. Going up/down 10 stairs (1 flight) 0  14. Standing for 1 hour 0  15.   sitting for 1 hour 4  16. Running on even ground 0  17. Running on uneven ground 0  18. Making sharp turns while running fast 0  19. Hopping  0  20. Rolling over in bed 1  Score total:  11/80     COGNITION: Overall cognitive status: Within functional limits for tasks assessed     SENSATION:  Reports tingling in entire R LE     LOWER EXTREMITY ROM:  Passive ROM Right eval Left eval  Hip flexion 90   Hip extension    Hip abduction WNL    Hip adduction    Hip internal rotation 15   Hip external rotation Deferred    Knee flexion    Knee extension    Ankle dorsiflexion    Ankle plantarflexion    Ankle inversion    Ankle eversion     (Blank rows = not tested)  LOWER EXTREMITY MMT:  MMT Right eval Left eval  Hip flexion    Hip extension    Hip abduction    Hip adduction    Hip internal rotation    Hip external rotation    Knee flexion 4+ 4+  Knee extension 5 5  Ankle dorsiflexion    Ankle plantarflexion    Ankle inversion    Ankle eversion     (Blank rows = not tested)      GAIT: Distance walked: in clinic distances  Assistive device utilized: Crutches Level of assistance: Modified independence Comments: antalgic, dragging R LE due to pain, limited WB surgical LE                                                                                                                                 TREATMENT DATE:   12/03/24 Inspection of incisions STM to anterior hip/quad PROM L hip flexion, abd Supine glute sets 5 x20 Supine TrA isometrics 5 x20 Prone HSC 2x10 LAQ 2x10 Review of healing timeline/precautions and restrictions     11/26/24 Changed bandages-no s/s of infection Bike 5 min no resist, pendulum due to anterior impingement Gait- glut set for hip ext, upright trunk & avoiding limp Prone position, static, + glut set, HS curls Manual LAD grad 1-2 with movement 0-30 abd   11/19/24  Eval, POC, HEP  Changed bandage- no signs of  infection, inflammation,  excessive drainage from incision site. Dressed incision with 3 small tegaderm dressings, placed large tegaderm over all three for extra protection  HEP review/discussion  PATIENT EDUCATION:  Education details: exam findings, POC, HEP, general expectations and timelines of recovery with PT  Person educated: Patient Education method: Explanation, Demonstration, and Handouts Education comprehension: verbalized understanding, returned demonstration, and needs further education  HOME EXERCISE PROGRAM: Access Code: [email or web address] URL: [email or web address] Date: 11/17/2024 Prepared by: Josette Rough    ASSESSMENT:  CLINICAL IMPRESSION:  Pt has suture protruding out of proximal incision. Instructed pt to monitor and to alert MD of any signs of infection/irritation. Good tolerance for basic protocol exercises today. She is limited in proximal hip discomfort today. Held bike today to avoid increasing irritation here. Instructed pt to utilize brace and AD as directed by MD. Pt has not been using ice machine, so advised her in use of this as well. Will continue to progress as tolerated per protocol.   OBJECTIVE IMPAIRMENTS: Abnormal gait, decreased activity tolerance, decreased balance, decreased knowledge of use of DME, decreased mobility, difficulty walking, decreased ROM, decreased strength, hypomobility, increased fascial restrictions, impaired perceived functional ability, impaired flexibility, and pain.   ACTIVITY LIMITATIONS: carrying, lifting, bending, standing, squatting, sleeping, stairs, transfers, bed mobility, and locomotion level  PARTICIPATION LIMITATIONS: meal prep, cleaning, laundry, driving, shopping, community activity, and occupation  PERSONAL FACTORS: Past/current experiences and Time since onset of injury/illness/exacerbation are also affecting patient's functional outcome.   REHAB POTENTIAL: Good  CLINICAL DECISION MAKING:  Stable/uncomplicated  EVALUATION COMPLEXITY: Low   GOALS: Goals reviewed with patient? No  SHORT TERM GOALS: Target date: 12/29/2024   1. Will be independent with appropriate progressive HEP  Baseline: Goal status: INITIAL  2.  Gait mechanics to have normalized with LRAD and pain with ambulation to be no more than 3/10 Baseline:  Goal status: INITIAL  3.  Hip AROM and PROM to be WNL with no extra pain  Baseline:  Goal status: INITIAL  4.  Will be independent in edema management program  Baseline:  Goal status: INITIAL  LONG TERM GOALS: Target date: 02/09/2025   1. MMT to be 5/5 in all tested groups Baseline:  Goal status: INITIAL  2.   Will have been able to return to all desired work and daily functional activities without increased pain  Baseline:  Goal status: INITIAL  3.  Will be able to climb flight of steps with reciprocal pattern and no increase in pain  Baseline:  Goal status: INITIAL  4.  Will be compliant with appropriate progressive return to gym level exercise programming without increased pain   Baseline:  Goal status: INITIAL    PLAN:  PT FREQUENCY: 2x/week  PT DURATION: 12 weeks  PLANNED INTERVENTIONS: 97164- PT Re-evaluation, 97750- Physical Performance Testing, 97110-Therapeutic exercises, 97530- Therapeutic activity, [id number]- Neuromuscular re-education, 97535- Self Care, 02859- Manual therapy, [phone]- Gait training, and [phone]- Aquatic Therapy  PLAN FOR NEXT SESSION: per protocol  Asberry Rodes, PTA  12/03/2024 5:02 PM  "

## 2024-12-06 ENCOUNTER — Encounter (HOSPITAL_BASED_OUTPATIENT_CLINIC_OR_DEPARTMENT_OTHER): Payer: Self-pay | Admitting: Physical Therapy

## 2024-12-06 ENCOUNTER — Ambulatory Visit (HOSPITAL_BASED_OUTPATIENT_CLINIC_OR_DEPARTMENT_OTHER): Admitting: Physical Therapy

## 2024-12-06 DIAGNOSIS — M6281 Muscle weakness (generalized): Secondary | ICD-10-CM

## 2024-12-06 DIAGNOSIS — M25551 Pain in right hip: Secondary | ICD-10-CM

## 2024-12-06 DIAGNOSIS — R262 Difficulty in walking, not elsewhere classified: Secondary | ICD-10-CM

## 2024-12-06 NOTE — Therapy (Signed)
 " OUTPATIENT PHYSICAL THERAPY LOWER EXTREMITY TREATMENT   Patient Name: Ann Taylor MRN: [id number] DOB:1981-06-12, 44 y.o., female Today's Date: 11/19/2024  END OF SESSION:  PT End of Session - 12/06/24 1103     Visit Number 4    Number of Visits 25    Date for Recertification  02/09/25    Authorization Type [employer]    Authorization Time Period 11/19/24-01/18/25    Authorization - Number of Visits 12    PT Start Time 1103    PT Stop Time 1135    PT Time Calculation (min) 32 min    Activity Tolerance Patient tolerated treatment well    Behavior During Therapy [facility] for tasks assessed/performed            Past Medical History:  Diagnosis Date   Arrhythmia    Headache(784.0)    Heart murmur    UTI (lower urinary tract infection)    Past Surgical History:  Procedure Laterality Date   BREAST BIOPSY Left 04/17/2023   MM LT BREAST BX W LOC DEV 1ST LESION IMAGE BX SPEC STEREO GUIDE 04/17/2023 [facility]   CESAREAN SECTION     HIP FRACTURE SURGERY Right 12/11/2023   Patient Active Problem List   Diagnosis Date Noted   Hip impingement syndrome, right 11/15/2024   Visit for routine gyn exam 03/27/2023   STD exposure 03/27/2023   Nexplanon  in place 03/27/2023   Bilateral radiating leg pain 02/12/2023   Mood disorder 02/12/2023   Essential hypertension 07/28/2021   Family history of sickle cell anemia 07/28/2021   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   Neuropathy 07/28/2021   Other allergy, initial encounter 07/28/2021   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 Everything from waist down hurts.   PERTINENT HISTORY: See above  PAIN:  Are you having pain? Yes: NPRS scale: 7/10 Pain location: anterior R hip  Pain description: throbbing, sharp, tingling down whole leg, pain in low back  Aggravating factors: moving it the wrong way  Relieving factors: taking right med combo for pain  PRECAUTIONS: Other: WBAT per op note   RED FLAGS: None   WEIGHT BEARING RESTRICTIONS: No  FALLS:  Has patient fallen in last 6 months? No  LIVING ENVIRONMENT: Lives with: lives with their family Lives in: House/apartment Stairs: no stairs  Has following equipment at home: Crutches  OCCUPATION: front desk [employer]   PLOF: Independent, Independent with basic ADLs, Independent with gait, and Independent with transfers  PATIENT GOALS: get back to work, heal correctly, be able to drive   NEXT MD VISIT: 07/01/72 with surgeon  OBJECTIVE:  Note: Objective measures were completed at Evaluation unless otherwise noted.    PATIENT SURVEYS:  LEFS  Extreme difficulty/unable (0), Quite a bit of difficulty (1), Moderate difficulty (2), Little difficulty (3), No difficulty (4)  Survey date:   11/19/24  Any of your usual work, housework or school activities 1  2. Usual hobbies, recreational or sporting activities 1  3. Getting into/out of the bath 1  4. Walking between rooms 1  5. Putting on socks/shoes 0  6. Squatting  0  7. Lifting an object, like a bag of groceries from the floor 0  8. Performing light activities around your home 1  9. Performing heavy activities around your home 0  10. Getting into/out of a car 1  11. Walking 2 blocks 0  12. Walking 1 mile 0  13. Going up/down 10 stairs (1 flight) 0  14. Standing for 1 hour 0  15.  sitting for 1 hour 4  16. Running on even ground 0  17. Running on uneven ground 0  18.  Making sharp turns while running fast 0  19. Hopping  0  20. Rolling over in bed 1  Score total:  11/80     COGNITION: Overall cognitive status: Within functional limits for tasks assessed     SENSATION:  Reports tingling in entire R LE     LOWER EXTREMITY ROM:  Passive ROM Right eval Left eval  Hip flexion 90   Hip extension    Hip abduction WNL    Hip adduction    Hip internal rotation 15   Hip external rotation Deferred    Knee flexion    Knee extension    Ankle dorsiflexion    Ankle plantarflexion    Ankle inversion    Ankle eversion     (Blank rows = not tested)  LOWER EXTREMITY MMT:  MMT Right eval Left eval  Hip flexion    Hip extension    Hip abduction    Hip adduction    Hip internal rotation    Hip external rotation    Knee flexion 4+ 4+  Knee extension 5 5  Ankle dorsiflexion    Ankle plantarflexion    Ankle inversion    Ankle eversion     (Blank rows = not tested)      GAIT: Distance walked: in clinic distances  Assistive device utilized: Crutches Level of assistance: Modified independence Comments: antalgic, dragging R LE due to pain, limited WB surgical LE                                                                                                                                 TREATMENT DATE:   12/06/24 Roller to hip, quads, hamstring Pin and stretch to HS via knee flexion/ext Upright bike 5 min  12/03/24 Inspection of incisions STM to anterior hip/quad PROM L hip flexion, abd Supine glute sets 5 x20 Supine TrA isometrics 5 x20 Prone HSC 2x10 LAQ 2x10 Review of healing timeline/precautions and restrictions     11/26/24 Changed bandages-no s/s of infection Bike 5 min no resist, pendulum due to anterior impingement Gait- glut set for hip ext, upright trunk & avoiding limp Prone position, static, + glut set, HS curls Manual LAD grad 1-2 with movement 0-30 abd   11/19/24  Eval, POC, HEP  Changed bandage- no  signs of infection, inflammation,  excessive drainage from incision site. Dressed incision with 3 small tegaderm dressings, placed large tegaderm over all three for extra protection  HEP review/discussion  PATIENT EDUCATION:  Education details: exam findings, POC, HEP, general expectations and timelines of recovery with PT  Person educated: Patient Education method: Explanation, Demonstration, and Handouts Education comprehension: verbalized understanding, returned demonstration, and needs further education  HOME EXERCISE PROGRAM: Access Code: [email or web address] URL: [email or web address] Date: 11/17/2024 Prepared by: Josette Rough    ASSESSMENT:  CLINICAL IMPRESSION:  Pt with significant soreness following a 3 hour session standing to braid hair. Focused on manual to decrease excessive soreness and bike for movement. Will do aquatic appt on wed for large range movement, heat to decrease spasm and decreased weight for improved gait pattern. Requested that she move frequently through the day but avoid long term positioning. Discussed possible use of rolling stool while braiding so she can move around.   OBJECTIVE IMPAIRMENTS: Abnormal gait, decreased activity tolerance, decreased balance, decreased knowledge of use of DME, decreased mobility, difficulty walking, decreased ROM, decreased strength, hypomobility, increased fascial restrictions, impaired perceived functional ability, impaired flexibility, and pain.   ACTIVITY LIMITATIONS: carrying, lifting, bending, standing, squatting, sleeping, stairs, transfers, bed mobility, and locomotion level  PARTICIPATION LIMITATIONS: meal prep, cleaning, laundry, driving, shopping, community activity, and occupation  PERSONAL FACTORS: Past/current experiences and Time since onset of injury/illness/exacerbation are also affecting patient's functional outcome.   REHAB POTENTIAL: Good  CLINICAL DECISION MAKING:  Stable/uncomplicated  EVALUATION COMPLEXITY: Low   GOALS: Goals reviewed with patient? No  SHORT TERM GOALS: Target date: 12/29/2024   1. Will be independent with appropriate progressive HEP  Baseline: Goal status: INITIAL  2.  Gait mechanics to have normalized with LRAD and pain with ambulation to be no more than 3/10 Baseline:  Goal status: INITIAL  3.  Hip AROM and PROM to be WNL with no extra pain  Baseline:  Goal status: INITIAL  4.  Will be independent in edema management program  Baseline:  Goal status: INITIAL  LONG TERM GOALS: Target date: 02/09/2025   1. MMT to be 5/5 in all tested groups Baseline:  Goal status: INITIAL  2.   Will have been able to return to all desired work and daily functional activities without increased pain  Baseline:  Goal status: INITIAL  3.  Will be able to climb flight of steps with reciprocal pattern and no increase in pain  Baseline:  Goal status: INITIAL  4.  Will be compliant with appropriate progressive return to gym level exercise programming without increased pain   Baseline:  Goal status: INITIAL    PLAN:  PT FREQUENCY: 2x/week  PT DURATION: 12 weeks  PLANNED INTERVENTIONS: 97164- PT Re-evaluation, 97750- Physical Performance Testing, 97110-Therapeutic exercises, 97530- Therapeutic activity, [id number]- Neuromuscular re-education, 97535- Self Care, 02859- Manual therapy, [id number]- Gait training, and [phone]- Aquatic Therapy  PLAN FOR NEXT SESSION: per protocol  Millenia Waldvogel C. Jasiah Buntin PT, DPT 12/06/2024 11:44 AM   "

## 2024-12-08 ENCOUNTER — Ambulatory Visit (HOSPITAL_BASED_OUTPATIENT_CLINIC_OR_DEPARTMENT_OTHER): Admitting: Physical Therapy

## 2024-12-08 DIAGNOSIS — M6281 Muscle weakness (generalized): Secondary | ICD-10-CM

## 2024-12-08 DIAGNOSIS — M25551 Pain in right hip: Secondary | ICD-10-CM

## 2024-12-08 DIAGNOSIS — M25651 Stiffness of right hip, not elsewhere classified: Secondary | ICD-10-CM

## 2024-12-08 DIAGNOSIS — R262 Difficulty in walking, not elsewhere classified: Secondary | ICD-10-CM

## 2024-12-08 NOTE — Therapy (Signed)
 " OUTPATIENT PHYSICAL THERAPY LOWER EXTREMITY TREATMENT   Patient Name: Ann Taylor MRN: [id number] DOB:04-20-1981, 44 y.o., female Today's Date: 11/19/2024  END OF SESSION:  PT End of Session - 12/08/24 1000     Visit Number 5    Number of Visits 25    Date for Recertification  02/09/25    Authorization Type [employer]    Authorization Time Period 11/19/24-01/18/25    Authorization - Visit Number 5    Authorization - Number of Visits 12    PT Start Time [phone]   pt arrived late;took time to change   PT Stop Time 1019    PT Time Calculation (min) 38 min    Activity Tolerance Patient tolerated treatment well    Behavior During Therapy [facility] for tasks assessed/performed            Past Medical History:  Diagnosis Date   Arrhythmia    Headache(784.0)    Heart murmur    UTI (lower urinary tract infection)    Past Surgical History:  Procedure Laterality Date   BREAST BIOPSY Left 04/17/2023   MM LT BREAST BX W LOC DEV 1ST LESION IMAGE BX SPEC STEREO GUIDE 04/17/2023 [facility]   CESAREAN SECTION     HIP FRACTURE SURGERY Right 12/11/2023   Patient Active Problem List   Diagnosis Date Noted   Hip impingement syndrome, right 11/15/2024   Visit for routine gyn exam 03/27/2023   STD exposure 03/27/2023   Nexplanon  in place 03/27/2023   Bilateral radiating leg pain 02/12/2023   Mood disorder 02/12/2023   Essential hypertension 07/28/2021   Family history of sickle cell anemia 07/28/2021   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   Neuropathy 07/28/2021   Other allergy, initial encounter 07/28/2021   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 07/26/2021   Migraine 07/26/2021    PCP: Duwaine Riggs FNP   REFERRING PROVIDER: Genelle Standing, MD  REFERRING DIAG:  Diagnosis  R10.31 (ICD-10-CM) - Right groin pain    THERAPY DIAG:  Pain in right hip  Difficulty in walking, not elsewhere classified  Muscle weakness (generalized)  Stiffness of right hip, not elsewhere classified  Rationale  for Evaluation and Treatment: Rehabilitation  ONSET DATE: R hip pincer debridement and labral reconstruction 11/15/24  SUBJECTIVE:   SUBJECTIVE STATEMENT: Pt reports continued elevated pain level in Rt ant hip.   PERTINENT HISTORY: See above  PAIN:  Are you having pain? Yes: NPRS scale: 7/10 Pain location: anterior R hip  Pain description: throbbing, sharp, tingling down whole leg, pain in low back  Aggravating factors: moving it the wrong way  Relieving factors: taking right med combo for pain  PRECAUTIONS: Other: WBAT per op note   RED FLAGS: None   WEIGHT BEARING RESTRICTIONS: No  FALLS:  Has patient fallen in last 6 months? No  LIVING ENVIRONMENT: Lives with: lives with their family Lives in: House/apartment Stairs: no stairs  Has following equipment at home: Crutches  OCCUPATION: front desk [employer]   PLOF: Independent, Independent with basic ADLs, Independent with gait, and Independent with transfers  PATIENT GOALS: get back to work, heal correctly, be able to drive   NEXT MD VISIT: 07/01/72 with surgeon  OBJECTIVE:  Note: Objective measures were completed at Evaluation unless otherwise noted.    PATIENT SURVEYS:  LEFS  Extreme difficulty/unable (0), Quite a bit of difficulty (1), Moderate difficulty (2), Little difficulty (3), No difficulty (4)  Survey date:   11/19/24  Any of your usual work, housework or school activities  1  2. Usual hobbies, recreational or sporting activities 1  3. Getting into/out of the bath 1  4. Walking between rooms 1  5. Putting on socks/shoes 0  6. Squatting  0  7. Lifting an object, like a bag of groceries from the floor 0  8. Performing light activities around your home 1  9. Performing heavy activities around your home 0  10. Getting into/out of a car 1  11. Walking 2 blocks 0  12. Walking 1 mile 0  13. Going up/down 10 stairs (1 flight) 0  14. Standing for 1 hour 0  15.  sitting for 1 hour 4  16. Running on even ground 0   17. Running on uneven ground 0  18. Making sharp turns while running fast 0  19. Hopping  0  20. Rolling over in bed 1  Score total:  11/80     COGNITION: Overall cognitive status: Within functional limits for tasks assessed     SENSATION:  Reports tingling in entire R LE     LOWER EXTREMITY ROM:  Passive ROM Right eval Left eval  Hip flexion 90   Hip extension    Hip abduction WNL    Hip adduction    Hip internal rotation 15   Hip external rotation Deferred    Knee flexion    Knee extension    Ankle dorsiflexion    Ankle plantarflexion    Ankle inversion    Ankle eversion     (Blank rows = not tested)  LOWER EXTREMITY MMT:  MMT Right eval Left eval  Hip flexion    Hip extension    Hip abduction    Hip adduction    Hip internal rotation    Hip external rotation    Knee flexion 4+ 4+  Knee extension 5 5  Ankle dorsiflexion    Ankle plantarflexion    Ankle inversion    Ankle eversion     (Blank rows = not tested)      GAIT: Distance walked: in clinic distances  Assistive device utilized: Crutches Level of assistance: Modified independence Comments: antalgic, dragging R LE due to pain, limited WB surgical LE                                                                                                                                 TREATMENT DATE:  12/08/24 Pt seen for aquatic therapy today.  Treatment took place in water 3.5-4.75 ft in depth at the [facility] pool. Temp of water was 91.  Pt entered/exited the pool via stairs in step-to/through pattern with bilat rail.  in 4+ ft of water: - Unsupported  walking forward/ backward 3 laps; side stepping 3 laps; heel raises x 10 - UE on wall: hip ext to toe touch x 10 each LE; hip abdct (small range) x 10 each LE; gentle marching (range to tolerance) - relaxed squat at wall -  straddling yellow noodle with UE on wall in deeper water:  cycling, hip abdct/add, hip flexion/extension (range  to tolerance), cycling; LE dangling for decompression - straddling yellow noodle in <23ft: vertical squats x 10; stool scoots backwards - 2 laps - plank at bench in water with gentle hip extension  12/06/24 Roller to hip, quads, hamstring Pin and stretch to HS via knee flexion/ext Upright bike 5 min  12/03/24 Inspection of incisions STM to anterior hip/quad PROM L hip flexion, abd Supine glute sets 5 x20 Supine TrA isometrics 5 x20 Prone HSC 2x10 LAQ 2x10 Review of healing timeline/precautions and restrictions   11/26/24 Changed bandages-no s/s of infection Bike 5 min no resist, pendulum due to anterior impingement Gait- glut set for hip ext, upright trunk & avoiding limp Prone position, static, + glut set, HS curls Manual LAD grad 1-2 with movement 0-30 abd   11/19/24 -Eval, POC, HEP -Changed bandage- no signs of infection, inflammation,  excessive drainage from incision site. Dressed incision with 3 small tegaderm dressings, placed large tegaderm over all three for extra protection -HEP review/discussion  PATIENT EDUCATION:  Education details: exam findings, POC, HEP, general expectations and timelines of recovery with PT  Person educated: Patient Education method: Explanation, Demonstration, and Handouts Education comprehension: verbalized understanding, returned demonstration, and needs further education  HOME EXERCISE PROGRAM: Access Code: [email or web address] URL: [email or web address] Date: 11/17/2024 Prepared by: Josette Rough    ASSESSMENT:  CLINICAL IMPRESSION: Per supervising PT, Harlene Cordon, pt appropriate to be seen in pool for aquatic therapy session.   Pt able to demonstrate good ROM of Rt hip with gait and exercise this session; no observed guarding.  Pt reported increase in Rt ant hip discomfort with hip abdct when in Lt SLS, but no pain with bil hip abdct when suspended in deeper water.   Pt reported that her pain remained 7/10 during  session. Encouraged self-care with icing and avoiding prolonged positions.   Pt will benefit from continued skilled PT intervention to assist with return to previous level of function.    OBJECTIVE IMPAIRMENTS: Abnormal gait, decreased activity tolerance, decreased balance, decreased knowledge of use of DME, decreased mobility, difficulty walking, decreased ROM, decreased strength, hypomobility, increased fascial restrictions, impaired perceived functional ability, impaired flexibility, and pain.   ACTIVITY LIMITATIONS: carrying, lifting, bending, standing, squatting, sleeping, stairs, transfers, bed mobility, and locomotion level  PARTICIPATION LIMITATIONS: meal prep, cleaning, laundry, driving, shopping, community activity, and occupation  PERSONAL FACTORS: Past/current experiences and Time since onset of injury/illness/exacerbation are also affecting patient's functional outcome.   REHAB POTENTIAL: Good  CLINICAL DECISION MAKING: Stable/uncomplicated  EVALUATION COMPLEXITY: Low   GOALS: Goals reviewed with patient? No  SHORT TERM GOALS: Target date: 12/29/2024   1. Will be independent with appropriate progressive HEP  Baseline: Goal status: INITIAL  2.  Gait mechanics to have normalized with LRAD and pain with ambulation to be no more than 3/10 Baseline: 7/10 Goal status: In progress - 12/08/24  3.  Hip AROM and PROM to be WNL with no extra pain  Baseline:  Goal status: INITIAL  4.  Will be independent in edema management program  Baseline:  Goal status: INITIAL  LONG TERM GOALS: Target date: 02/09/2025   1. MMT to be 5/5 in all tested groups Baseline:  Goal status: INITIAL  2.   Will have been able to return to all desired work and daily functional activities without increased pain  Baseline:  Goal status: INITIAL  3.  Will be able to  climb flight of steps with reciprocal pattern and no increase in pain  Baseline:  Goal status: INITIAL  4.  Will be compliant with  appropriate progressive return to gym level exercise programming without increased pain   Baseline:  Goal status: INITIAL    PLAN:  PT FREQUENCY: 2x/week  PT DURATION: 12 weeks  PLANNED INTERVENTIONS: 97164- PT Re-evaluation, 97750- Physical Performance Testing, 97110-Therapeutic exercises, 97530- Therapeutic activity, [id number]- Neuromuscular re-education, 97535- Self Care, 02859- Manual therapy, [phone]- Gait training, and [phone]- Aquatic Therapy  PLAN FOR NEXT SESSION: per protocol   Delon Aquas, PTA 12/08/2024 10:37 AM [facility] [address] [place], [place], [address] Phone: [phone]   Fax:  [phone]  "

## 2024-12-10 ENCOUNTER — Encounter (HOSPITAL_BASED_OUTPATIENT_CLINIC_OR_DEPARTMENT_OTHER)

## 2024-12-14 ENCOUNTER — Ambulatory Visit (HOSPITAL_BASED_OUTPATIENT_CLINIC_OR_DEPARTMENT_OTHER): Admitting: Physical Therapy

## 2024-12-14 DIAGNOSIS — M25651 Stiffness of right hip, not elsewhere classified: Secondary | ICD-10-CM

## 2024-12-14 DIAGNOSIS — M25551 Pain in right hip: Secondary | ICD-10-CM

## 2024-12-14 DIAGNOSIS — R262 Difficulty in walking, not elsewhere classified: Secondary | ICD-10-CM

## 2024-12-14 DIAGNOSIS — M6281 Muscle weakness (generalized): Secondary | ICD-10-CM

## 2024-12-14 NOTE — Therapy (Signed)
 " OUTPATIENT PHYSICAL THERAPY LOWER EXTREMITY TREATMENT   Patient Name: Ann Taylor MRN: [id number] DOB:Apr 11, 1981, 44 y.o., female Today's Date: 11/19/2024  END OF SESSION:      Past Medical History:  Diagnosis Date   Arrhythmia    Headache(784.0)    Heart murmur    UTI (lower urinary tract infection)    Past Surgical History:  Procedure Laterality Date   BREAST BIOPSY Left 04/17/2023   MM LT BREAST BX W LOC DEV 1ST LESION IMAGE BX SPEC STEREO GUIDE 04/17/2023 [facility]   CESAREAN SECTION     HIP FRACTURE SURGERY Right 12/11/2023   Patient Active Problem List   Diagnosis Date Noted   Hip impingement syndrome, right 11/15/2024   Visit for routine gyn exam 03/27/2023   STD exposure 03/27/2023   Nexplanon  in place 03/27/2023   Bilateral radiating leg pain 02/12/2023   Mood disorder 02/12/2023   Essential hypertension 07/28/2021   Family history of sickle cell anemia 07/28/2021   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   Neuropathy 07/28/2021   Other allergy, initial encounter 07/28/2021   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  Pt states she hasn't used her crutches since the 2nd day of surgery.  Pt states she had increased bilat hip and LE pain last night.  Pt states that has been occurring for about a week.  Pt reports she has shooting pain down R LE.  Pt  reports having a lumbar MRI prior to surgery. Pt has increased pain with too much  walking and standing for a long period of time.  Pt reports having increased pain with cooking.  Pt reports compliance with HEP.  Pt states she lies prone on an ice pack when she hurts which helps.  Pt continues to ha   PERTINENT HISTORY: See above   PAIN:  Are you having pain? Yes: NPRS scale: 7/10 Pain location: anterior R hip  Pain description: throbbing, sharp, tingling down whole leg, pain in low back.  Pt states she has tingling in bilat LE's.  Aggravating factors: moving it the wrong way  Relieving factors: taking right med combo for pain  PRECAUTIONS: Other: WBAT per op note   RED FLAGS: None   WEIGHT BEARING RESTRICTIONS: No  FALLS:  Has patient fallen in last 6 months? No  LIVING ENVIRONMENT: Lives with: lives with their family Lives in: House/apartment Stairs: no stairs  Has following equipment at home: Crutches  OCCUPATION: [employer]   PLOF: Independent, Independent with basic ADLs, Independent with gait, and Independent with transfers  PATIENT GOALS: get back to work, heal correctly, be able to drive   NEXT MD VISIT: 07/01/72 with surgeon  OBJECTIVE:  Note: Objective measures were completed at Evaluation unless otherwise noted.    PATIENT SURVEYS:  LEFS  Extreme difficulty/unable (0), Quite a bit of difficulty (1), Moderate difficulty (2), Little difficulty (3), No difficulty (4)  Survey date:   11/19/24  Any of your usual work, housework  or school activities 1  2. Usual hobbies, recreational or sporting activities 1  3. Getting into/out of the bath 1  4. Walking between rooms 1  5. Putting on socks/shoes 0  6. Squatting  0  7. Lifting an object, like a bag of groceries from the floor 0  8. Performing light activities around your home 1  9. Performing heavy activities around your home 0  10. Getting into/out of a car 1  11. Walking 2 blocks 0  12. Walking 1 mile 0  13. Going up/down 10 stairs (1 flight) 0  14. Standing for 1 hour 0  15.   sitting for 1 hour 4  16. Running on even ground 0  17. Running on uneven ground 0  18. Making sharp turns while running fast 0  19. Hopping  0  20. Rolling over in bed 1  Score total:  11/80     COGNITION: Overall cognitive status: Within functional limits for tasks assessed     SENSATION:  Reports tingling in entire R LE     LOWER EXTREMITY ROM:  Passive ROM Right eval Left eval  Hip flexion 90   Hip extension    Hip abduction WNL    Hip adduction    Hip internal rotation 15   Hip external rotation Deferred    Knee flexion    Knee extension    Ankle dorsiflexion    Ankle plantarflexion    Ankle inversion    Ankle eversion     (Blank rows = not tested)  LOWER EXTREMITY MMT:  MMT Right eval Left eval  Hip flexion    Hip extension    Hip abduction    Hip adduction    Hip internal rotation    Hip external rotation    Knee flexion 4+ 4+  Knee extension 5 5  Ankle dorsiflexion    Ankle plantarflexion    Ankle inversion    Ankle eversion     (Blank rows = not tested)      GAIT: Distance walked: in clinic distances  Assistive device utilized: Crutches Level of assistance: Modified independence Comments: antalgic, dragging R LE due to pain, limited WB surgical LE                                                                                                                                 TREATMENT DATE:  12/14/24  Reviewed pt presentation, pain level, HEP compliance, and response to prior treatment.  PT answered pt's questions.   Attempted upright bike lvl 0 though stopped due to pain having pain Quad sets with 5 sec hold Prone HS curl 2x12 Supine Tra Contraction Hooklying bent knee fall out with TrA 2x10 Qped rocking 2x10  Pt received R hip PROM in flexion and abduction per pt and tissue tolerance.    12/08/24 Pt seen for aquatic therapy today.  Treatment took place in water 3.5-4.75 ft in  depth at the [facility] pool. Temp of  water was 91.  Pt entered/exited the pool via stairs in step-to/through pattern with bilat rail.  in 4+ ft of water: - Unsupported  walking forward/ backward 3 laps; side stepping 3 laps; heel raises x 10 - UE on wall: hip ext to toe touch x 10 each LE; hip abdct (small range) x 10 each LE; gentle marching (range to tolerance) - relaxed squat at wall - straddling yellow noodle with UE on wall in deeper water:  cycling, hip abdct/add, hip flexion/extension (range to tolerance), cycling; LE dangling for decompression - straddling yellow noodle in <19ft: vertical squats x 10; stool scoots backwards - 2 laps - plank at bench in water with gentle hip extension  12/06/24 Roller to hip, quads, hamstring Pin and stretch to HS via knee flexion/ext Upright bike 5 min  12/03/24 Inspection of incisions STM to anterior hip/quad PROM L hip flexion, abd Supine glute sets 5 x20 Supine TrA isometrics 5 x20 Prone HSC 2x10 LAQ 2x10 Review of healing timeline/precautions and restrictions   11/26/24 Changed bandages-no s/s of infection Bike 5 min no resist, pendulum due to anterior impingement Gait- glut set for hip ext, upright trunk & avoiding limp Prone position, static, + glut set, HS curls Manual LAD grad 1-2 with movement 0-30 abd   11/19/24 -Eval, POC, HEP -Changed bandage- no signs of infection, inflammation,  excessive drainage from incision site. Dressed incision with 3 small tegaderm dressings, placed large tegaderm over all three for extra protection -HEP review/discussion  PATIENT EDUCATION:  Education details: exam findings, POC, HEP, general expectations and timelines of recovery with PT  Person educated: Patient Education method: Explanation, Demonstration, and Handouts Education comprehension: verbalized understanding, returned demonstration, and needs further education  HOME EXERCISE PROGRAM: Access Code: [email or web address] URL: [email or web address] Date:  11/17/2024 Prepared by: Josette Rough    ASSESSMENT:  CLINICAL IMPRESSION: Per supervising PT, Harlene Cordon, pt appropriate to be seen in pool for aquatic therapy session.   Pt able to demonstrate good ROM of Rt hip with gait and exercise this session; no observed guarding.  Pt reported increase in Rt ant hip discomfort with hip abdct when in Lt SLS, but no pain with bil hip abdct when suspended in deeper water.   Pt reported that her pain remained 7/10 during session. Encouraged self-care with icing and avoiding prolonged positions.   Pt will benefit from continued skilled PT intervention to assist with return to previous level of function.    6/10  OBJECTIVE IMPAIRMENTS: Abnormal gait, decreased activity tolerance, decreased balance, decreased knowledge of use of DME, decreased mobility, difficulty walking, decreased ROM, decreased strength, hypomobility, increased fascial restrictions, impaired perceived functional ability, impaired flexibility, and pain.   ACTIVITY LIMITATIONS: carrying, lifting, bending, standing, squatting, sleeping, stairs, transfers, bed mobility, and locomotion level  PARTICIPATION LIMITATIONS: meal prep, cleaning, laundry, driving, shopping, community activity, and occupation  PERSONAL FACTORS: Past/current experiences and Time since onset of injury/illness/exacerbation are also affecting patient's functional outcome.   REHAB POTENTIAL: Good  CLINICAL DECISION MAKING: Stable/uncomplicated  EVALUATION COMPLEXITY: Low   GOALS: Goals reviewed with patient? No  SHORT TERM GOALS: Target date: 12/29/2024   1. Will be independent with appropriate progressive HEP  Baseline: Goal status: INITIAL  2.  Gait mechanics to have normalized with LRAD and pain with ambulation to be no more than 3/10 Baseline: 7/10 Goal status: In progress - 12/08/24  3.  Hip AROM and PROM to be WNL with no extra  pain  Baseline:  Goal status: INITIAL  4.  Will be independent in  edema management program  Baseline:  Goal status: INITIAL  LONG TERM GOALS: Target date: 02/09/2025   1. MMT to be 5/5 in all tested groups Baseline:  Goal status: INITIAL  2.   Will have been able to return to all desired work and daily functional activities without increased pain  Baseline:  Goal status: INITIAL  3.  Will be able to climb flight of steps with reciprocal pattern and no increase in pain  Baseline:  Goal status: INITIAL  4.  Will be compliant with appropriate progressive return to gym level exercise programming without increased pain   Baseline:  Goal status: INITIAL    PLAN:  PT FREQUENCY: 2x/week  PT DURATION: 12 weeks  PLANNED INTERVENTIONS: 97164- PT Re-evaluation, 97750- Physical Performance Testing, 97110-Therapeutic exercises, 97530- Therapeutic activity, [id number]- Neuromuscular re-education, 97535- Self Care, 02859- Manual therapy, [phone]- Gait training, and [phone]- Aquatic Therapy  PLAN FOR NEXT SESSION: per protocol   Delon Aquas, PTA 12/14/24 3:46 PM [facility] [address] [place], [place], [address] Phone: [phone]   Fax:  [phone]  "

## 2024-12-15 ENCOUNTER — Encounter (HOSPITAL_BASED_OUTPATIENT_CLINIC_OR_DEPARTMENT_OTHER): Payer: Self-pay | Admitting: Physical Therapy

## 2024-12-17 ENCOUNTER — Ambulatory Visit (HOSPITAL_BASED_OUTPATIENT_CLINIC_OR_DEPARTMENT_OTHER)

## 2024-12-17 ENCOUNTER — Encounter (HOSPITAL_BASED_OUTPATIENT_CLINIC_OR_DEPARTMENT_OTHER): Payer: Self-pay

## 2024-12-17 DIAGNOSIS — M6281 Muscle weakness (generalized): Secondary | ICD-10-CM

## 2024-12-17 DIAGNOSIS — R262 Difficulty in walking, not elsewhere classified: Secondary | ICD-10-CM

## 2024-12-17 DIAGNOSIS — M25551 Pain in right hip: Secondary | ICD-10-CM

## 2024-12-17 DIAGNOSIS — M25651 Stiffness of right hip, not elsewhere classified: Secondary | ICD-10-CM

## 2024-12-17 NOTE — Therapy (Signed)
 " OUTPATIENT PHYSICAL THERAPY LOWER EXTREMITY TREATMENT   Patient Name: Ann Taylor MRN: [id number] DOB:05/25/1981, 44 y.o., female Today's Date: 11/19/2024  END OF SESSION:  PT End of Session - 12/17/24 1525     Visit Number 7    Number of Visits 25    Date for Recertification  02/09/25    Authorization Type [employer]    Authorization Time Period 11/19/24-01/18/25    Authorization - Visit Number 6    Authorization - Number of Visits 12    PT Start Time 1522    PT Stop Time 1600    PT Time Calculation (min) 38 min    Activity Tolerance Patient tolerated treatment well    Behavior During Therapy WFL for tasks assessed/performed              Past Medical History:  Diagnosis Date   Arrhythmia    Headache(784.0)    Heart murmur    UTI (lower urinary tract infection)    Past Surgical History:  Procedure Laterality Date   BREAST BIOPSY Left 04/17/2023   MM LT BREAST BX W LOC DEV 1ST LESION IMAGE BX SPEC STEREO GUIDE 04/17/2023 [facility]   CESAREAN SECTION     HIP FRACTURE SURGERY Right 12/11/2023   Patient Active Problem List   Diagnosis Date Noted   Hip impingement syndrome, right 11/15/2024   Visit for routine gyn exam 03/27/2023   STD exposure 03/27/2023   Nexplanon  in place 03/27/2023   Bilateral radiating leg pain 02/12/2023   Mood disorder 02/12/2023   Essential hypertension 07/28/2021   Family history of sickle cell anemia 07/28/2021   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   Neuropathy 07/28/2021   Other allergy, initial encounter 07/28/2021   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 I took  medicine before I came.  Still can't walk around a grocery store.   PERTINENT HISTORY: See above   PAIN:  Are you having pain? Yes: NPRS scale: 0/10 Pain location: anterior R hip  Pain description: throbbing, sharp, tingling down whole leg, pain in low back.  Pt states she has tingling in bilat LE's.  Aggravating factors: moving it the wrong way  Relieving factors: taking right med combo for pain  PRECAUTIONS: Other: WBAT per op note   RED FLAGS: None   WEIGHT BEARING RESTRICTIONS: No  FALLS:  Has patient fallen in last 6 months? No  LIVING ENVIRONMENT: Lives with: lives with their family Lives in: House/apartment Stairs: no stairs  Has following equipment at home: Crutches  OCCUPATION: front desk [employer]   PLOF: Independent, Independent with basic ADLs, Independent with gait, and Independent with transfers  PATIENT GOALS: get back to work, heal correctly, be able to drive   NEXT MD VISIT: 07/01/72 with surgeon  OBJECTIVE:  Note: Objective measures were completed at Evaluation unless otherwise noted.    PATIENT SURVEYS:  LEFS  Extreme difficulty/unable (0), Quite a bit of difficulty (1), Moderate difficulty (2), Little difficulty (3), No difficulty (4)  Survey date:   11/19/24  Any of your usual work, housework or school activities 1  2. Usual hobbies, recreational or sporting activities 1  3. Getting into/out of the bath 1  4. Walking between rooms 1  5. Putting on socks/shoes 0  6. Squatting  0  7. Lifting an object, like a bag of groceries from the floor 0  8. Performing light activities around your home 1  9. Performing heavy activities around your home 0  10. Getting into/out of a car 1  11. Walking 2 blocks 0  12. Walking 1 mile 0  13. Going up/down 10 stairs (1 flight) 0  14. Standing for 1  hour 0  15.  sitting for 1 hour 4  16. Running on even ground 0  17. Running on uneven ground 0  18. Making sharp turns while running fast 0  19. Hopping  0  20. Rolling over in bed 1  Score total:  11/80     COGNITION: Overall cognitive status: Within functional limits for tasks assessed     SENSATION:  Reports tingling in entire R LE     LOWER EXTREMITY ROM:  Passive ROM Right eval Left eval  Hip flexion 90   Hip extension    Hip abduction WNL    Hip adduction    Hip internal rotation 15   Hip external rotation Deferred    Knee flexion    Knee extension    Ankle dorsiflexion    Ankle plantarflexion    Ankle inversion    Ankle eversion     (Blank rows = not tested)  LOWER EXTREMITY MMT:  MMT Right eval Left eval  Hip flexion    Hip extension    Hip abduction    Hip adduction    Hip internal rotation    Hip external rotation    Knee flexion 4+ 4+  Knee extension 5 5  Ankle dorsiflexion    Ankle plantarflexion    Ankle inversion    Ankle eversion     (Blank rows = not tested)      GAIT: Distance walked: in clinic distances  Assistive device utilized: Crutches Level of assistance: Modified independence Comments: antalgic, dragging R LE due to pain, limited WB surgical LE                                                                                                                                 TREATMENT DATE:    1/23 PROM R hip BKFO 2x10 R Prone HSC 2x10 Prone hip extension 2x10bil Prone IR/ER x10R Bridges 2x10 LAQ 3# 5 2x15 HR 2x10 Partial squats 2x10 Gait in hall x149ft    12/14/24  Reviewed pt presentation, pain level, HEP compliance, and response to prior treatment.  PT answered pt's questions.   Attempted upright bike lvl 0 though stopped due to pain having pain Quad sets with 5 sec hold Prone HS curl  2x12 Supine TrA Contraction Hooklying bent knee fall out with TrA 2x10 Qped rocking 2x10  Pt received R hip PROM  in flexion and abduction per pt and tissue tolerance.    12/08/24 Pt seen for aquatic therapy today.  Treatment took place in water 3.5-4.75 ft in depth at the [facility] pool. Temp of water was 91.  Pt entered/exited the pool via stairs in step-to/through pattern with bilat rail.  in 4+ ft of water: - Unsupported  walking forward/ backward 3 laps; side stepping 3 laps; heel raises x 10 - UE on wall: hip ext to toe touch x 10 each LE; hip abdct (small range) x 10 each LE; gentle marching (range to tolerance) - relaxed squat at wall - straddling yellow noodle with UE on wall in deeper water:  cycling, hip abdct/add, hip flexion/extension (range to tolerance), cycling; LE dangling for decompression - straddling yellow noodle in <54ft: vertical squats x 10; stool scoots backwards - 2 laps - plank at bench in water with gentle hip extension  12/06/24 Roller to hip, quads, hamstring Pin and stretch to HS via knee flexion/ext Upright bike 5 min  12/03/24 Inspection of incisions STM to anterior hip/quad PROM L hip flexion, abd Supine glute sets 5 x20 Supine TrA isometrics 5 x20 Prone HSC 2x10 LAQ 2x10 Review of healing timeline/precautions and restrictions  PATIENT EDUCATION:  Education details:  POC, exercise form, HEP, post op protocol and restrictions  Person educated: Patient Education method: Explanation, Demonstration, and Handouts Education comprehension: verbalized understanding, returned demonstration, and needs further education  HOME EXERCISE PROGRAM: Access Code: [email or web address] URL: [email or web address] Date: 11/17/2024 Prepared by: Josette Rough    ASSESSMENT:  CLINICAL IMPRESSION: Pt nearly 5 weeks s/p. She is progressing well with exercises. Mild discomfort in groin area with end range PROM, but overall good available motion noted. Able to gently progress with [facility] and therex interventions. She denied pain in clinic, but educated about DOMS And  management. Pt states suture is still protruding out of proximal most incision. PTA observed with no signs of infection. Pt instructed to monitor and alert MD if signs of infection are noted. She has f/u early Feb with MD. Will continue to progress as tolerated.     OBJECTIVE IMPAIRMENTS: Abnormal gait, decreased activity tolerance, decreased balance, decreased knowledge of use of DME, decreased mobility, difficulty walking, decreased ROM, decreased strength, hypomobility, increased fascial restrictions, impaired perceived functional ability, impaired flexibility, and pain.   ACTIVITY LIMITATIONS: carrying, lifting, bending, standing, squatting, sleeping, stairs, transfers, bed mobility, and locomotion level  PARTICIPATION LIMITATIONS: meal prep, cleaning, laundry, driving, shopping, community activity, and occupation  PERSONAL FACTORS: Past/current experiences and Time since onset of injury/illness/exacerbation are also affecting patient's functional outcome.   REHAB POTENTIAL: Good  CLINICAL DECISION MAKING: Stable/uncomplicated  EVALUATION COMPLEXITY: Low   GOALS: Goals reviewed with patient? No  SHORT TERM GOALS: Target date: 12/29/2024   1. Will be independent with appropriate progressive HEP  Baseline: Goal status: INITIAL  2.  Gait mechanics to have normalized with LRAD and pain with ambulation to be no more than 3/10 Baseline: 7/10 Goal status: In progress - 12/08/24  3.  Hip AROM and PROM to be WNL with no extra pain  Baseline:  Goal status: INITIAL  4.  Will be independent in edema management program  Baseline:  Goal status: INITIAL  LONG TERM GOALS: Target date: 02/09/2025   1. MMT to be 5/5 in all tested groups Baseline:  Goal status: INITIAL  2.  Will have been able to return to all desired work and daily functional activities without increased pain  Baseline:  Goal status: INITIAL  3.  Will be able to climb flight of steps with reciprocal pattern and no  increase in pain  Baseline:  Goal status: INITIAL  4.  Will be compliant with appropriate progressive return to gym level exercise programming without increased pain   Baseline:  Goal status: INITIAL    PLAN:  PT FREQUENCY: 2x/week  PT DURATION: 12 weeks  PLANNED INTERVENTIONS: [id number]- PT Re-evaluation, 97750- Physical Performance Testing, 97110-Therapeutic exercises, 97530- Therapeutic activity, [id number]- Neuromuscular re-education, 97535- Self Care, [id number]- Manual therapy, [phone]- Gait training, and [phone]- Aquatic Therapy  PLAN FOR NEXT SESSION: per protocol   Asberry Rodes, PTA  12/17/24 4:54 PM  [facility] [address] [place], [place], [address] Phone: [phone]   Fax:  [phone]  "

## 2024-12-18 ENCOUNTER — Encounter (HOSPITAL_BASED_OUTPATIENT_CLINIC_OR_DEPARTMENT_OTHER): Payer: Self-pay | Admitting: Orthopaedic Surgery

## 2024-12-20 ENCOUNTER — Other Ambulatory Visit (HOSPITAL_BASED_OUTPATIENT_CLINIC_OR_DEPARTMENT_OTHER): Payer: Self-pay | Admitting: Orthopaedic Surgery

## 2024-12-20 MED ORDER — OXYCODONE HCL 5 MG PO TABS: 5.0000 mg | ORAL_TABLET | ORAL | 0 refills | Status: AC | PRN

## 2024-12-21 ENCOUNTER — Ambulatory Visit (HOSPITAL_BASED_OUTPATIENT_CLINIC_OR_DEPARTMENT_OTHER): Admitting: Physical Therapy

## 2024-12-24 ENCOUNTER — Encounter (HOSPITAL_BASED_OUTPATIENT_CLINIC_OR_DEPARTMENT_OTHER): Payer: Self-pay

## 2024-12-24 ENCOUNTER — Ambulatory Visit (HOSPITAL_BASED_OUTPATIENT_CLINIC_OR_DEPARTMENT_OTHER)

## 2024-12-24 DIAGNOSIS — M25651 Stiffness of right hip, not elsewhere classified: Secondary | ICD-10-CM

## 2024-12-24 DIAGNOSIS — M25551 Pain in right hip: Secondary | ICD-10-CM

## 2024-12-24 DIAGNOSIS — M6281 Muscle weakness (generalized): Secondary | ICD-10-CM

## 2024-12-24 DIAGNOSIS — R262 Difficulty in walking, not elsewhere classified: Secondary | ICD-10-CM

## 2024-12-28 ENCOUNTER — Ambulatory Visit (HOSPITAL_BASED_OUTPATIENT_CLINIC_OR_DEPARTMENT_OTHER): Admitting: Physical Therapy

## 2024-12-28 ENCOUNTER — Encounter (HOSPITAL_BASED_OUTPATIENT_CLINIC_OR_DEPARTMENT_OTHER): Payer: Self-pay | Admitting: Physical Therapy

## 2024-12-28 DIAGNOSIS — M6281 Muscle weakness (generalized): Secondary | ICD-10-CM

## 2024-12-28 DIAGNOSIS — M25551 Pain in right hip: Secondary | ICD-10-CM

## 2024-12-28 DIAGNOSIS — M25651 Stiffness of right hip, not elsewhere classified: Secondary | ICD-10-CM

## 2024-12-28 DIAGNOSIS — R262 Difficulty in walking, not elsewhere classified: Secondary | ICD-10-CM

## 2024-12-29 ENCOUNTER — Other Ambulatory Visit (HOSPITAL_BASED_OUTPATIENT_CLINIC_OR_DEPARTMENT_OTHER): Payer: Self-pay

## 2024-12-29 ENCOUNTER — Ambulatory Visit (INDEPENDENT_AMBULATORY_CARE_PROVIDER_SITE_OTHER): Admitting: Orthopaedic Surgery

## 2024-12-29 DIAGNOSIS — M25851 Other specified joint disorders, right hip: Secondary | ICD-10-CM

## 2024-12-29 MED ORDER — IBUPROFEN 800 MG PO TABS
800.0000 mg | ORAL_TABLET | Freq: Three times a day (TID) | ORAL | 1 refills | Status: AC
  Filled 2024-12-29: qty 90, 30d supply, fill #0

## 2024-12-29 NOTE — Progress Notes (Signed)
 "                                Post Operative Evaluation    Procedure/Date of Surgery: Right hip labral reconstruction 12/22  Interval History:   Status post right hip labral reconstruction doing well.  She is having difficulty for sitting for longer periods of time.  She has to transition quite frequently.  She is getting some burning in the proximal thigh   PMH/PSH/Family History/Social History/Meds/Allergies:    Past Medical History:  Diagnosis Date   Arrhythmia    Headache(784.0)    Heart murmur    UTI (lower urinary tract infection)    Past Surgical History:  Procedure Laterality Date   BREAST BIOPSY Left 04/17/2023   MM LT BREAST BX W LOC DEV 1ST LESION IMAGE BX SPEC STEREO GUIDE 04/17/2023 [facility]   CESAREAN SECTION     HIP FRACTURE SURGERY Right 12/11/2023   Social History   Socioeconomic History   Marital status: Single    Spouse name: Not on file   Number of children: Not on file   Years of education: Not on file   Highest education level: Not on file  Occupational History   Not on file  Tobacco Use   Smoking status: Never   Smokeless tobacco: Never  Vaping Use   Vaping status: Never Used  Substance and Sexual Activity   Alcohol use: No   Drug use: No   Sexual activity: Yes    Birth control/protection: None  Other Topics Concern   Not on file  Social History Narrative   Not on file   Social Drivers of Health   Tobacco Use: Low Risk (12/28/2024)   Patient History    Smoking Tobacco Use: Never    Smokeless Tobacco Use: Never    Passive Exposure: Not on file  Financial Resource Strain: At Risk (04/25/2023)   Received from [employer]    Financial Resource Strain: 2  Food Insecurity: Not on file (04/25/2023)  Transportation Needs: Not at Risk (04/25/2023)   Received from [employer] Needs    Transportation: 1  Physical Activity: Not on File (03/10/2023)   Received from [facility]   Physical Activity     Physical Activity: 0  Stress: Not on File (03/10/2023)   Received from [facility]   Stress    Stress: 0  Social Connections: Not on File (07/29/2023)   Received from [facility]   Social Connections    Connectedness: 0  Depression (PHQ2-9): Low Risk (03/27/2023)   Depression (PHQ2-9)    PHQ-2 Score: 4  Alcohol Screen: Not on file  Housing: Not on file  Utilities: Not on file  Health Literacy: Not on file   Family History  Problem Relation Age of Onset   Alcohol abuse Neg Hx    Arthritis Neg Hx    Asthma Neg Hx    Birth defects Neg Hx    Cancer Neg Hx    COPD Neg Hx    Depression Neg Hx    Diabetes Neg Hx    Drug abuse Neg Hx    Early death Neg Hx    Hearing loss Neg Hx    Heart disease Neg Hx    Hyperlipidemia Neg Hx    Hypertension Neg Hx    Kidney disease Neg Hx    Learning disabilities Neg Hx    Mental illness Neg  Hx    Mental retardation Neg Hx    Miscarriages / Stillbirths Neg Hx    Stroke Neg Hx    Vision loss Neg Hx    Allergies[1] Current Outpatient Medications  Medication Sig Dispense Refill   HYDROmorphone  (DILAUDID ) 2 MG tablet Take 1 tablet (2 mg total) by mouth every 4 (four) hours as needed for severe pain (pain score 7-10). 25 tablet 0   ibuprofen  (ADVIL ) 800 MG tablet Take 1 tablet (800 mg total) by mouth every 8 (eight) hours. Please take with food, please alternate with acetaminophen  90 tablet 1   oxyCODONE  (ROXICODONE ) 5 MG immediate release tablet Take 1 tablet (5 mg total) by mouth every 4 (four) hours as needed for severe pain (pain score 7-10) or breakthrough pain. 20 tablet 0   aspirin  EC 325 MG tablet Take 1 tablet (325 mg total) by mouth daily. 14 tablet 0   cetirizine (ZYRTEC) 10 MG tablet Take 10 mg by mouth daily.     diphenhydrAMINE (BENADRYL) 25 MG tablet Take 25 mg by mouth every 6 (six) hours as needed.     gabapentin  (NEURONTIN ) 300 MG capsule Take 300 mg by mouth 3 (three) times daily.     hydrochlorothiazide (HYDRODIURIL) 25 MG tablet Take 25 mg  by mouth daily.     HYDROcodone -acetaminophen  (NORCO/VICODIN) 5-325 MG tablet Take 1 tablet by mouth every 6 (six) hours as needed for moderate pain (pain score 4-6). 20 tablet 0   No current facility-administered medications for this visit.   No results found.  Review of Systems:   A ROS was performed including pertinent positives and negatives as documented in the HPI.   Musculoskeletal Exam:    There were no vitals taken for this visit.  Right hip incisions are well-appearing without erythema or drainage.  30 degrees internal/external rotation of the right hip are without pain.  Walks with mildly antalgic gait  Imaging:      I personally reviewed and interpreted the radiographs.   Assessment:   6 weeks status post right hip arthroscopic labral reconstruction doing well.  She is continuing to make progress and I do believe she may return to work the following Monday.  I will plan to see her back in 6 weeks for reassessment.  She will continue to work through physical therapy  Plan :    - Return to clinic 6 weeks for reassessment      I personally saw and evaluated the patient, and participated in the management and treatment plan.  Elspeth Parker, MD Attending Physician, Orthopedic Surgery  This document was dictated using Dragon voice recognition software. A reasonable attempt at proof reading has been made to minimize errors.     [1]  Allergies Allergen Reactions   Oxycodone  Hives and Itching   Shellfish Allergy Anaphylaxis   Shellfish Protein-Containing Drug Products Anaphylaxis   Grass Pollen(K-O-R-T-Swt Vern) Hives   Codone [Hydrocodone ] Itching   Codeine Hives, Itching and Rash   Tramadol  Hives, Itching and Anxiety   "

## 2024-12-31 ENCOUNTER — Ambulatory Visit (HOSPITAL_BASED_OUTPATIENT_CLINIC_OR_DEPARTMENT_OTHER)

## 2024-12-31 ENCOUNTER — Encounter (HOSPITAL_BASED_OUTPATIENT_CLINIC_OR_DEPARTMENT_OTHER): Payer: Self-pay

## 2024-12-31 DIAGNOSIS — R262 Difficulty in walking, not elsewhere classified: Secondary | ICD-10-CM

## 2024-12-31 DIAGNOSIS — M25551 Pain in right hip: Secondary | ICD-10-CM

## 2024-12-31 DIAGNOSIS — M25651 Stiffness of right hip, not elsewhere classified: Secondary | ICD-10-CM

## 2024-12-31 DIAGNOSIS — M6281 Muscle weakness (generalized): Secondary | ICD-10-CM

## 2024-12-31 NOTE — Therapy (Signed)
 " OUTPATIENT PHYSICAL THERAPY LOWER EXTREMITY TREATMENT   Patient Name: Ann Taylor MRN: [id number] DOB:Nov 26, 1980, 44 y.o., female Today's Date: 11/19/2024  END OF SESSION:  PT End of Session - 12/31/24 1523     Visit Number 10    Number of Visits 25    Date for Recertification  02/09/25    Authorization Type [employer]    Authorization Time Period 11/19/24-01/18/25    Authorization - Visit Number 8    Authorization - Number of Visits 12    PT Start Time 1522    PT Stop Time 1600    PT Time Calculation (min) 38 min    Activity Tolerance Patient tolerated treatment well    Behavior During Therapy [facility] for tasks assessed/performed                 Past Medical History:  Diagnosis Date   Arrhythmia    Headache(784.0)    Heart murmur    UTI (lower urinary tract infection)    Past Surgical History:  Procedure Laterality Date   BREAST BIOPSY Left 04/17/2023   MM LT BREAST BX W LOC DEV 1ST LESION IMAGE BX SPEC STEREO GUIDE 04/17/2023 [facility]   CESAREAN SECTION     HIP FRACTURE SURGERY Right 12/11/2023   Patient Active Problem List   Diagnosis Date Noted   Hip impingement syndrome, right 11/15/2024   Visit for routine gyn exam 03/27/2023   STD exposure 03/27/2023   Nexplanon  in place 03/27/2023   Bilateral radiating leg pain 02/12/2023   Mood disorder 02/12/2023   Essential hypertension 07/28/2021   Family history of sickle cell anemia 07/28/2021   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   Neuropathy 07/28/2021   Other allergy, initial encounter 07/28/2021   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 I usually do them 2x a week. She reports general fatigue today from being on her feet all day. Feels achiness in hip vs pain.    PERTINENT HISTORY: See above   PAIN:  Are you having pain? Yes: NPRS scale: 2/10 Pain location: anterior R hip, groin, and R thigh Pain description: throbbing, sharp, tingling down whole leg, pain in low back.  Pt states she has tingling in bilat LE's.  Aggravating factors: moving it the wrong way  Relieving factors: taking right med combo for pain  PRECAUTIONS: Other: WBAT per op note   RED FLAGS: None   WEIGHT BEARING RESTRICTIONS: No  FALLS:  Has patient fallen in last 6 months? No  LIVING ENVIRONMENT: Lives with: lives with their family Lives in: House/apartment Stairs: no stairs  Has following equipment at home: Crutches  OCCUPATION: front desk [employer]   PLOF: Independent, Independent with basic ADLs, Independent with gait, and Independent with transfers  PATIENT GOALS: get back to work, heal correctly, be able to drive   NEXT MD VISIT: 07/01/72 with surgeon  OBJECTIVE:  Note: Objective measures were completed at Evaluation unless otherwise noted.    PATIENT SURVEYS:  LEFS  Extreme difficulty/unable (0), Quite a bit of difficulty (1), Moderate difficulty (2), Little difficulty (3), No difficulty (4)  Survey date:   11/19/24  Any of your usual work, housework or school activities 1  2. Usual hobbies, recreational or sporting activities 1  3. Getting into/out of the bath 1  4. Walking between rooms 1  5. Putting on socks/shoes 0  6. Squatting  0  7. Lifting an object, like a bag of groceries from the floor 0  8. Performing light activities around your home 1  9. Performing heavy activities around your home 0  10. Getting into/out of a car 1   11. Walking 2 blocks 0  12. Walking 1 mile 0  13. Going up/down 10 stairs (1 flight) 0  14. Standing for 1 hour 0  15.  sitting for 1 hour 4  16. Running on even ground 0  17. Running on uneven ground 0  18. Making sharp turns while running fast 0  19. Hopping  0  20. Rolling over in bed 1  Score total:  11/80     COGNITION: Overall cognitive status: Within functional limits for tasks assessed     SENSATION:  Reports tingling in entire R LE     LOWER EXTREMITY ROM:  Passive ROM Right eval Left eval  Hip flexion 90   Hip extension    Hip abduction WNL    Hip adduction    Hip internal rotation 15   Hip external rotation Deferred    Knee flexion    Knee extension    Ankle dorsiflexion    Ankle plantarflexion    Ankle inversion    Ankle eversion     (Blank rows = not tested)  LOWER EXTREMITY MMT:  MMT Right eval Left eval  Hip flexion    Hip extension    Hip abduction    Hip adduction    Hip internal rotation    Hip external rotation    Knee flexion 4+ 4+  Knee extension 5 5  Ankle dorsiflexion    Ankle plantarflexion    Ankle inversion    Ankle eversion     (Blank rows = not tested)      GAIT: Distance walked: in clinic distances  Assistive device utilized: Crutches Level of assistance: Modified independence Comments: antalgic, dragging R LE due to pain, limited WB surgical LE                                                                                                                                 TREATMENT DATE:    2/6 Pt received R hip PROM in flex, abd, ER, and IR in supine Hip mobilizations inferior and posterior Grade I-II Prone hip ER/IR 2x10 Prone hip extension 2x10 S/l clam 2x10 Supine bridge 3x10 Standing heel raises 2x15 Mini squats with UE support on rail 2x10 Step ups on 4 inch step  2x10  LAQ 3# 2x15  2/3 Pt received STM to R quad in supine Pt received R hip PROM in flex, abd, ER, and IR in supine Prone hip  ER/IR 2x10 Prone hip extension 2x10 Supine bridge 2x10 Standing heel raises 2x10 Mini squats with UE support on rail x10 Step ups on 4 inch step  2x10  LAQ 3# 2x15    PATIENT EDUCATION:  Education details:  POC, exercise form, HEP, post op protocol and restrictions  Person educated: Patient Education method: Explanation, Demonstration, and Handouts Education comprehension: verbalized understanding, returned demonstration, and needs further education  HOME EXERCISE PROGRAM: Access Code: [email or web address] URL: [email or web address] Date: 11/17/2024 Prepared by: Josette Rough    ASSESSMENT:  CLINICAL IMPRESSION: Pt 6 weeks s/p. She requires cues to reduce muscle guarding during hip PROM. She continues to have discomfort in hip with return to neutral from flexed  position. Added s/l clams today with good tolerance. Increased reps slightly with exercises without complaint. Instructed pt to continue ice prn. She returns to work on Monday. Discussed tactics to manage symptoms while at work. Will continue to progress as tolerated.     OBJECTIVE IMPAIRMENTS: Abnormal gait, decreased activity tolerance, decreased balance, decreased knowledge of use of DME, decreased mobility, difficulty walking, decreased ROM, decreased strength, hypomobility, increased fascial restrictions, impaired perceived functional ability, impaired flexibility, and pain.   ACTIVITY LIMITATIONS: carrying, lifting, bending, standing, squatting, sleeping, stairs, transfers, bed mobility, and locomotion level  PARTICIPATION LIMITATIONS: meal prep, cleaning, laundry, driving, shopping, community activity, and occupation  PERSONAL FACTORS: Past/current experiences and Time since onset of injury/illness/exacerbation are also affecting patient's functional outcome.   REHAB POTENTIAL: Good  CLINICAL DECISION MAKING: Stable/uncomplicated  EVALUATION COMPLEXITY: Low   GOALS: Goals reviewed with patient?  No  SHORT TERM GOALS: Target date: 12/29/2024   1. Will be independent with appropriate progressive HEP  Baseline: Goal status: MET 12/31/24  2.  Gait mechanics to have normalized with LRAD and pain with ambulation to be no more than 3/10 Baseline: 7/10 Goal status: In progress - 12/08/24  3.  Hip AROM and PROM to be WNL with no extra pain  Baseline:  Goal status: IN PROGRESS 12/31/24  4.  Will be independent in edema management program  Baseline:  Goal status: MET 12/31/24  LONG TERM GOALS: Target date: 02/09/2025   1. MMT to be 5/5 in all tested groups Baseline:  Goal status: INITIAL  2.   Will have been able to return to all desired work and daily functional activities without increased pain  Baseline:  Goal status: INITIAL  3.  Will be able to climb flight of steps with reciprocal pattern and no increase in pain  Baseline:  Goal status: INITIAL  4.  Will be compliant with appropriate progressive return to gym level exercise programming without increased pain   Baseline:  Goal status: INITIAL    PLAN:  PT FREQUENCY: 2x/week  PT DURATION: 12 weeks  PLANNED INTERVENTIONS: 97164- PT Re-evaluation, 97750- Physical Performance Testing, 97110-Therapeutic exercises, 97530- Therapeutic activity, [id number]- Neuromuscular re-education, 97535- Self Care, [id number]- Manual therapy, [phone]- Gait training, and [phone]- Aquatic Therapy  PLAN FOR NEXT SESSION: per protocol   Asberry Rodes, PTA  12/31/24 4:29 PM   [facility] [address] [place], [place], [address] Phone: [phone]   Fax:  [phone]  "

## 2025-01-04 ENCOUNTER — Ambulatory Visit (HOSPITAL_BASED_OUTPATIENT_CLINIC_OR_DEPARTMENT_OTHER): Admitting: Physical Therapy

## 2025-01-07 ENCOUNTER — Encounter (HOSPITAL_BASED_OUTPATIENT_CLINIC_OR_DEPARTMENT_OTHER)

## 2025-01-11 ENCOUNTER — Encounter (HOSPITAL_BASED_OUTPATIENT_CLINIC_OR_DEPARTMENT_OTHER): Admitting: Physical Therapy

## 2025-01-14 ENCOUNTER — Encounter (HOSPITAL_BASED_OUTPATIENT_CLINIC_OR_DEPARTMENT_OTHER)

## 2025-01-18 ENCOUNTER — Encounter (HOSPITAL_BASED_OUTPATIENT_CLINIC_OR_DEPARTMENT_OTHER): Admitting: Physical Therapy

## 2025-01-21 ENCOUNTER — Encounter (HOSPITAL_BASED_OUTPATIENT_CLINIC_OR_DEPARTMENT_OTHER): Admitting: Physical Therapy

## 2025-02-09 ENCOUNTER — Encounter (HOSPITAL_BASED_OUTPATIENT_CLINIC_OR_DEPARTMENT_OTHER): Admitting: Orthopaedic Surgery
